# Patient Record
Sex: Male | Born: 1950 | Race: White | Marital: Married | Smoking: Never smoker
Health system: Northeastern US, Academic
[De-identification: ages and names within clinical notes are randomized; demographics above are authoritative.]

## PROBLEM LIST (undated history)

## (undated) DIAGNOSIS — G35D Multiple sclerosis, unspecified: Secondary | ICD-10-CM

## (undated) DIAGNOSIS — G35 Multiple sclerosis: Secondary | ICD-10-CM

## (undated) DIAGNOSIS — E785 Hyperlipidemia, unspecified: Secondary | ICD-10-CM

## (undated) DIAGNOSIS — F959 Tic disorder, unspecified: Secondary | ICD-10-CM

## (undated) DIAGNOSIS — J45909 Unspecified asthma, uncomplicated: Secondary | ICD-10-CM

## (undated) DIAGNOSIS — L409 Psoriasis, unspecified: Secondary | ICD-10-CM

## (undated) HISTORY — PX: JOINT REPLACEMENT: SHX530

## (undated) HISTORY — PX: INCISIONAL HERNIA REPAIR: SHX193

## (undated) HISTORY — DX: Tic disorder, unspecified: F95.9

## (undated) HISTORY — DX: Hyperlipidemia, unspecified: E78.5

## (undated) HISTORY — DX: Unspecified asthma, uncomplicated: J45.909

## (undated) HISTORY — DX: Multiple sclerosis, unspecified: G35.D

## (undated) HISTORY — PX: KNEE ARTHROSCOPY: SHX127

## (undated) HISTORY — PX: OTHER SURGICAL HISTORY: SHX169

## (undated) HISTORY — PX: HERNIA REPAIR: SHX51

## (undated) HISTORY — PX: COLONOSCOPY: SHX174

## (undated) HISTORY — DX: Psoriasis, unspecified: L40.9

## (undated) HISTORY — PX: HX TONSILLECTOMY/ADENOIDECTOMY: SHX292

## (undated) HISTORY — DX: Multiple sclerosis: G35

## (undated) HISTORY — PX: VASECTOMY: SHX75

## (undated) MED FILL — Methylprednisolone Sod Succ For Inj PF 125 MG (Base Equiv): INTRAMUSCULAR | Qty: 2 | Status: AC

## (undated) MED FILL — Acetaminophen Tab 325 MG: ORAL | Qty: 3 | Status: AC

## (undated) MED FILL — Cetirizine HCl Tab 10 MG: ORAL | Qty: 1 | Status: AC

## (undated) MED FILL — Ocrelizumab Soln For IV Infusion 300 MG/10ML: INTRAVENOUS | Qty: 20 | Status: AC

## (undated) MED FILL — Diphenhydramine HCl Tab 25 MG: ORAL | Qty: 100 | Status: AC

## (undated) MED FILL — Diphenhydramine HCl Tab 25 MG: ORAL | Qty: 1 | Status: AC

## (undated) MED FILL — Methylprednisolone Sod Succ For Inj 125 MG (Base Equiv): INTRAMUSCULAR | Qty: 2 | Status: AC

---

## 1999-11-05 ENCOUNTER — Encounter: Payer: Self-pay | Admitting: Cardiovascular Disease

## 2008-05-30 DIAGNOSIS — G35 Multiple sclerosis: Secondary | ICD-10-CM | POA: Insufficient documentation

## 2008-05-30 DIAGNOSIS — E785 Hyperlipidemia, unspecified: Secondary | ICD-10-CM

## 2008-05-30 DIAGNOSIS — I4510 Unspecified right bundle-branch block: Secondary | ICD-10-CM

## 2008-05-30 HISTORY — DX: Hyperlipidemia, unspecified: E78.5

## 2008-05-30 HISTORY — DX: Unspecified right bundle-branch block: I45.10

## 2009-02-22 DIAGNOSIS — I712 Thoracic aortic aneurysm, without rupture, unspecified: Secondary | ICD-10-CM

## 2009-02-22 HISTORY — DX: Thoracic aortic aneurysm, without rupture: I71.2

## 2009-02-22 HISTORY — DX: Thoracic aortic aneurysm, without rupture, unspecified: I71.20

## 2009-02-27 ENCOUNTER — Encounter: Payer: Self-pay | Admitting: Cardiology

## 2009-02-28 ENCOUNTER — Other Ambulatory Visit: Payer: Self-pay | Admitting: Cardiology

## 2009-02-28 DIAGNOSIS — I517 Cardiomegaly: Secondary | ICD-10-CM | POA: Insufficient documentation

## 2009-02-28 HISTORY — DX: Cardiomegaly: I51.7

## 2009-10-09 ENCOUNTER — Encounter: Payer: Self-pay | Admitting: Gastroenterology

## 2009-12-31 ENCOUNTER — Ambulatory Visit: Payer: Self-pay | Admitting: Neurology

## 2009-12-31 LAB — CBC
Hematocrit: 44 % (ref 40–51)
Hemoglobin: 15.5 g/dL (ref 13.7–17.5)
MCV: 87 fL (ref 79–92)
Platelets: 170 THOU/uL (ref 150–330)
RBC: 5.1 MIL/uL (ref 4.6–6.1)
RDW: 12.7 % (ref 11.6–14.4)
WBC: 5.9 THOU/uL (ref 4.2–9.1)

## 2009-12-31 LAB — HEPATIC FUNCTION PANEL
ALT: 55 U/L — ABNORMAL HIGH (ref 0–50)
AST: 44 U/L (ref 0–50)
Albumin: 4.3 g/dL (ref 3.5–5.2)
Alk Phos: 87 U/L (ref 40–130)
Bilirubin,Direct: <0.2 mg/dL (ref 0.0–0.3)
Bilirubin,Total: 0.7 mg/dL (ref 0.0–1.2)
Total Protein: 6.9 g/dL (ref 6.3–7.7)

## 2009-12-31 NOTE — Progress Notes (Signed)
Reason For Visit   Multiple sclerosis.  HPI   He has been doing well without any new symptoms suggesting a flareup.    He   has no difficulty with Betaseron injections.  In the past this flared up   psoriasis but this is the only a minor manageable problem latel.  Current Meds   Advair Diskus 250-50 MCG/DOSE MISC;USE 1 INHALATION EVERY 12 HOURS   DAILYprn; Rx  Proventil HFA 108 (90 Base) MCG/ACT Aerosol Solution;INHALE 2 PUFFS EVERY 6   HOURS AS NEEDED; Rx  Vectical 3 MCG/GM Ointment;; RPT  Baclofen 10 MG Tablet;TAKE 1 TABLET TWICE DAILY; Rx  Clobex Spray 0.05 % Liquid;once daily for psoriasis; RPT  Dronabinol 5 MG Capsule;TAKE 1 CAPSULE TWICE DAILY.; Rx  Famciclovir 500 MG Tablet;TAKE 1 TABLET 3 TIMES DAILY.; Rx  Hydrocodone-Acetaminophen 5-500 MG Tablet;TAKE 1 TABLET EVERY 6 HOURS AS   NEEDED.; Rx  Cialis 5 MG Tablet;TAKE 1 TABLET EVERY DAY; Rx  Betaseron 0.3 MG Solution Reconstituted;INJECT 0.25 MG SUBCUTANEOUSLY EVERY   OTHER DAY.; Rx.  ROS   Is no change in bowel control.  Is no change in bladder control.  Fatigue   is manageable.  He sensitivities manageable.  The remainder of the 10   system review is unremarkable.  .  Vital Signs   Recorded by Fabbozzi-Tomaka,Delia on 31 Dec 2009 10:10 AM  BP:120/78,  RUE,  Sitting,   HR: 56 b/min,   Weight: 183.5 lb,   Pain Scale: 0.  Physical Exam   He had normal affect and grossly normal cognition.  Cranial nerves were   intact.  Power was full throughout.  Tone and coordination were normal.    Casual gait appeared normal, but he had mild ataxia on tandem gait testing.    Romberg was mildly positive.  She walked 8 m in 3.07 seconds without use   of an aide.  .  Assessment   Overall pattern relapsing MS that has been clinically stable.  He will   continue on Betaseron.     .  Signature   Electronically signed by: Cleopatra Cedar  M.D.; 12/31/2009 12:54 PM EST.

## 2010-07-01 ENCOUNTER — Ambulatory Visit: Payer: Self-pay | Admitting: Neurology

## 2010-08-05 ENCOUNTER — Ambulatory Visit: Payer: Self-pay

## 2010-09-08 ENCOUNTER — Ambulatory Visit
Admit: 2010-09-08 | Discharge: 2010-09-08 | Disposition: A | Discharge status: Home / Self Care | Payer: Self-pay | Source: Ambulatory Visit | Attending: Primary Care | Admitting: Primary Care

## 2010-09-08 LAB — COMPREHENSIVE METABOLIC PANEL
ALT: 41 U/L (ref 0–50)
AST: 40 U/L (ref 0–50)
Albumin: 4.1 g/dL (ref 3.5–5.2)
Alk Phos: 78 U/L (ref 40–130)
Anion Gap: 10 (ref 7–16)
Bilirubin,Total: 0.7 mg/dL (ref 0.0–1.2)
CO2: 29 mmol/L — ABNORMAL HIGH (ref 20–28)
Calcium: 9.1 mg/dL (ref 8.6–10.2)
Chloride: 101 mmol/L (ref 96–108)
Creatinine: 1.15 mg/dL (ref 0.67–1.17)
GFR,Black: >59 *
GFR,Caucasian: >59 *
Glucose: 79 mg/dL (ref 74–106)
Lab: 17 mg/dL (ref 6–20)
Potassium: 4.2 mmol/L (ref 3.3–5.1)
Sodium: 140 mmol/L (ref 133–145)
Total Protein: 6.6 g/dL (ref 6.3–7.7)

## 2010-09-08 LAB — LIPID PANEL
Chol/HDL Ratio: 4.3
Cholesterol: 200 mg/dL — AB
HDL: 46 mg/dL
LDL Calculated: 137 mg/dL
Non HDL Cholesterol: 154 mg/dL
Triglycerides: 87 mg/dL

## 2010-09-08 LAB — PSA (EFF.4-2010): PSA (eff. 4-2010): 0.58 ng/mL (ref 0.00–4.00)

## 2010-09-09 ENCOUNTER — Ambulatory Visit: Payer: Self-pay | Admitting: Primary Care

## 2010-09-14 NOTE — H&P (Signed)
 Reason For Visit   Normal routine history and physical adult.  HPI   59 yo WM is new to practice, seeking new PCP.  He has longstanding but mild   and well compensated multiple sclerosis, followed by neurology specialist.    He remains able to play tennis, continues Betaseron to control inflammation   of CNS.  Fatigues more easily but not feeling limited really, doing most   that chooses to try.     Advised in past that had borderline high lipids and also some evidence of   LVH, conduction abnormality, but BP always okay, no sugar problems, no   exertional sx of concern.       Some airway reactivity has been readily controlled, intermittent, mostly   autumn with molds more than weeds, gone with first frost.     Meds reviewed.  Allergies   No Known Drug Allergy.  Current Meds   Proventil HFA 108 (90 Base) MCG/ACT Aerosol Solution;INHALE 2 PUFFS EVERY 6   HOURS AS NEEDED; Rx  Vectical 3 MCG/GM Ointment;; RPT  Clobex Spray 0.05 % Liquid;once daily for psoriasis; RPT  Betaseron 0.3 MG Solution Reconstituted;INJECT 0.25 MG SUBCUTANEOUSLY EVERY   OTHER DAY.; Rx  Cialis 5 MG Tablet;TAKE 1 TABLET EVERY DAY; Rx  Advair Diskus 250-50 MCG/DOSE Aerosol Powder Breath Activated;TAKE 1 PUFF   BY MOUTH TWICE A DAY RINSE MOUTH AFTER; RPT  Fluticasone Propionate 50 MCG/ACT Suspension;USE 1 SPRAY IN EACH NOSTRIL   EVERY DAY; RPT  Cetirizine HCl 10 MG Tablet;1 TABLET BY MOUTH DAILY; RPT.  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  PSH   Arthroscopy Knee Right; Right; 10/1999-for torn meniscus- Dr.  Elease Hashimoto  Incisional Hernia Repair; Left; 1991  Surgery Of Male Genitalia Vasectomy (V25.2); 1997  Tonsillectomy; 1960.  Family Hx   Family history of Coronary Artery Disease.  Personal Hx   Never smoked, alcohol negligible, remote marijuana experimentation.  Feels   diet is balanced and generally complete.  Happily married, enjoying his   work.  Sleep adequate, seatbelt consistent, and no environmental exposures   of concern.  Regular dental care/hygiene.  ROS   Systemic symptoms: Feeling fine  and not feeling tired (fatigue).  No   recent weight change.  Head symptoms: No headache.  Eye symptoms: No vision problems.  Otolaryngeal symptoms: No hearing loss.  No nasal symptoms  and no sore   throat.  Cardiovascular symptoms: No chest pain or discomfort, no palpitations, and   no intermittent leg claudication.  Pulmonary symptoms: No cough.  Gastrointestinal symptoms: Normal appetite, no heartburn, no nausea, no   vomiting, no abdominal pain, no change in stool, no diarrhea, and no   constipation.  Genitourinary symptoms: No hematuria  and no increase in urinary frequency.    No dysuria.  Endocrine symptoms: No polydipsia.  Hematologic symptoms: No easy bleeding  and no tendency for easy bruising.  Musculoskeletal symptoms: No back pain  and no myalgias.  No soft tissue   swelling, no localized joint pain, and no localized joint swelling.  Neurological symptoms: No  dizziness, no lightheadedness, no fainting, and   no memory lapses or loss.  Psychological symptoms: No depression.  Skin symptoms: No rash.  No localized skin lesions  that seem active,   changing.  Past medical history: A recent examination by an ophthalmologist.  Immunizations   DT; 06 Feb 1997  Influenza; 11 Aug 2007  Influenza; 19 Jun 2008  H1N1 Influenza Inj; 25 Sep 2008  Influenza; 22 Jul 2009  Influenza (Split); 05 Aug 2010.  Health Mgmt Plan   Colonoscopy every 10 years; for HEALTH MAINTENANCE.  POCT   ECG:  sinus bradycardia with prominent LV  voltage and inferior wall ST-T   changes, normal anterior forces.  No ectopy, nl conduction.  Looks like   2006 tracing in old records.  Vital Signs   Recorded by [email or web address] on 09 Sep 2010 09:10 AM  BP:109/78,  LUE,  Sitting,   HR: 56 b/min,  Apical, Regular,   Resp: 16 r/min, Normal,   Height: 70.5 in, Weight: 183 lb, BMI: 25.9 kg/m2.  Physical Exam   GENERAL: 58 year y/o male in NAD.  HEENT: Neck supple, PERRL, EOMI, Fundi benign, TM's clear bilaterally,   oropharynx clear, thyroid without obvious nodules or enlargement. No   lymphadenopathy.   LUNGS: CTA bilaterally, no wheezes with forced expiration, respirations   unlabored.  CV: Regular rate without murmurs, rubs, or gallops, No carotid bruits heard.  ABDOMEN: + BS. Soft, non-tender, no HSM, no palpable masses, no scars.  GENITALIA: Normal male genitalia. No penile discharge. No palpable   testicular masses. No palpable hernias.   RECTAL: Normal tone, no palpable masses, stool Hemoccult negative. Prostate   not enlarged, no nodules.  EXTREMITIES: Distal pulses 2+ bilaterally, no edema.  SKIN: No rashes, no evidence of skin breakdown, no suspicious nevi.  NEURO: Cranial nerves II-XII intact. Gait normal. Reflexes 2+ and symmetric.  MENTAL STATUS: Alert, normal MS. Answers all questions appropriately.  LYMPHATIC: No enlarged nodes.  Assessment   M.S. -- well compensated, not functionally intrusive, and f/u in place.    Will avoid live-virus vaccines.     CV -- mild aortic root and LA enlargement, LVH noted.  Excellent BPs, no   signif valvular insuff/stenoses, so will simply follow.  Urged to get more   consistent CV exercise, and also to get LDL down a bit although TG, HDL   okay and no CV prematurity in family, otherwise low CAD risk profile.     Asthma/allergy -- discussed LABA controversy, and he'll discuss with his   specialist.  Is content with current control of allergy season-related sx.     Maint -- Adacel, Pneumovax today.  Proxy to be forwarded.  Coun/Edu    --Lab results discussed; see scanned image  --Diet/body weight discussed  --Aerobic exercise discussed  --Alcohol use discussed  --Recreational drug use discussed  --Colon CA screening discussed.  --Testicular self exam discussed  --Prostate cancer screening discussed   --Skin CA awareness/prevention discussed  --Dental care discussed  --Cardiac risk factor modification discussed  --Motor vehicle safety discussed.  Signature   Electronically signed by: Rosezena Sensor  M.D.; 09/14/2010 9:01 PM EST; Chartered loss adjuster.

## 2010-10-21 NOTE — Miscellaneous (Unsigned)
 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.4)   Problem: MRI Knee Osteonecrosis Medial Tibial Plateau; Left  Diagnosis: Multiple Sclerosis (340)     Family History  Family history of Coronary Artery Disease    Social History  Alcohol  Brushing The Teeth  Caffeine Use  Exercise Habits  Flossing The Teeth  Habits Sleep  Housing Without Fire Extinguishers  Marital History  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 ; Rx   Cetirizine HCl 10 MG Tablet; 1 TABLET BY MOUTH DAILY ; RPT   Cialis 5 MG Tablet; TAKE 1 TABLET EVERY DAY ; Rx   Clobex Spray 0.05 % Liquid; once daily for psoriasis ; RPT   Fluticasone Propionate 50 MCG/ACT Suspension; USE 1 SPRAY IN EACH NOSTRIL   EVERY DAY ; RPT   Proventil HFA 108 (90 Base) MCG/ACT Aerosol Solution; INHALE 2 PUFFS EVERY   6 HOURS AS NEEDED ; Rx   Vectical 3 MCG/GM Ointment ; RPT     Immunizations  DT   Influenza   Influenza   H1N1 Influenza Inj   Influenza   Influenza (Split)   Tdap (Adacel)   Pneumo (Pneumovax)

## 2011-04-22 ENCOUNTER — Telehealth: Payer: Self-pay

## 2011-04-22 NOTE — Telephone Encounter (Signed)
 Called, no answer, left VM.  [name]

## 2011-04-22 NOTE — Telephone Encounter (Signed)
Patient having burning in foot.  Offered appt with dr. Ardelia Mems on 7/30 but he feels that is too far away.  Please call to discuss issue.

## 2011-04-23 ENCOUNTER — Other Ambulatory Visit: Payer: Self-pay | Admitting: Medical

## 2011-04-23 ENCOUNTER — Other Ambulatory Visit: Payer: Self-pay | Admitting: Family Medicine

## 2011-04-23 MED ORDER — GABAPENTIN 300 MG PO CAPS
300.0000 mg | ORAL_CAPSULE | Freq: Four times a day (QID) | ORAL | Status: DC | PRN
Start: 2011-04-23 — End: 2011-10-26

## 2011-04-23 NOTE — Telephone Encounter (Signed)
Spoke to pt, some recurrent, but not constant left foot pain.  Had similar symptoms in past.  Retry gabapentin.  Rx sent to pharm.  If progressive symptoms, will call back.  [name]

## 2011-04-28 ENCOUNTER — Encounter: Payer: Self-pay | Admitting: Gastroenterology

## 2011-07-29 ENCOUNTER — Other Ambulatory Visit: Payer: Self-pay | Admitting: Primary Care

## 2011-07-29 MED ORDER — TADALAFIL 5 MG PO TABS *I*
ORAL_TABLET | ORAL | Status: DC
Start: 2011-07-29 — End: 2012-08-31

## 2011-10-26 ENCOUNTER — Ambulatory Visit: Payer: Self-pay | Admitting: Sports Medicine

## 2011-10-26 ENCOUNTER — Encounter: Payer: Self-pay | Admitting: Sports Medicine

## 2011-10-26 VITALS — BP 117/81 | Ht 71.0 in | Wt 182.0 lb

## 2011-10-26 DIAGNOSIS — M25569 Pain in unspecified knee: Secondary | ICD-10-CM

## 2011-11-02 ENCOUNTER — Ambulatory Visit: Payer: Self-pay | Admitting: Sports Medicine

## 2011-11-02 ENCOUNTER — Encounter: Payer: Self-pay | Admitting: Sports Medicine

## 2011-11-02 VITALS — BP 108/76 | Ht 71.0 in | Wt 182.0 lb

## 2011-11-02 DIAGNOSIS — M25569 Pain in unspecified knee: Secondary | ICD-10-CM

## 2011-11-09 NOTE — H&P (Signed)
PATIENTHALIM, CYPRET  MR #:  [id number]   ACCOUNT #:  [id number] DOB:  01/12/1951   ATTENDING:  Lanae Boast, MD AGE:  61   DATE OF VISIT:  10/26/2011     New patient consultation:  A 61 year old male with complaints of right medial knee pain.  The patient has had symptoms since November, medial based, aggravated playing tennis.  Pain is 5/10.  No treatment or diagnostics to date.  Here today for formal consultation and evaluation of options.    PAST MEDICAL HISTORY:  Denies heart disease, stroke, diabetes, hypertension, and blood clots.    REVIEW OF SYSTEMS:  Ten-system review notable for asthma and some breathing issues.  Occasional numbness and tingling.    PHYSICAL EXAMINATION:  Pleasant, cooperative, alert and oriented, in no apparent stress.  Focused examination of the right knee:  There is no warmth, erythema, ecchymosis, or skin lesion.  Tender to palpation across the medial compartment.  Limitations in flexion, circumduction.  Stable ligamentously.  No hip or low back findings.    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  Reviewed the potential benefit of an interarticular corticosteroid injection.  He can not have one at this point in time, but will contact us and return in the next week or 10 days for a injection of corticosteroid into the medial compartment if not significantly better.  Further diagnostics could be performed.  He has been seen for this right knee back in 2006.             ______________________________  Lanae Boast, MD    MM/MODL  DD:  11/09/2011 07:00:39  DT:  11/09/2011 07:15:45  Job #:  [id number]

## 2011-11-11 ENCOUNTER — Other Ambulatory Visit: Payer: Self-pay | Admitting: Nurse Practitioner

## 2011-11-18 ENCOUNTER — Ambulatory Visit: Payer: Self-pay | Admitting: Primary Care

## 2011-11-18 ENCOUNTER — Encounter: Payer: Self-pay | Admitting: Primary Care

## 2011-11-18 VITALS — BP 122/70 | HR 60 | Ht 70.87 in | Wt 180.0 lb

## 2011-11-18 DIAGNOSIS — M23309 Other meniscus derangements, unspecified meniscus, unspecified knee: Secondary | ICD-10-CM

## 2011-11-18 MED ORDER — SULFAMETHOXAZOLE-TRIMETHOPRIM 800-160 MG PO TABS *I*
1.0000 | ORAL_TABLET | Freq: Two times a day (BID) | ORAL | Status: AC
Start: 2011-11-18 — End: 2011-11-27

## 2011-11-18 NOTE — Progress Notes (Signed)
Subjective:     Patient ID: [name] is a 61 y.o. male.    HPI  He returns to discuss a few things:    Skin lesion -- he injects Betaseron daily into subcut abdomen as before, and has developed an area that is red, increasingly tender at injection site R lower abd wall.  No drainage, applying Neosporin, not helping.  He notes no fever/chills.  A week or so?    CV -- cardiol following incidental finding of aortic widening in ascending aorta, stable at last check and on schedule.  He notes no palpitations, lightheadedness, DOE and enjoys vigorous physical exercise, tennis.    MSK -- some knee DJD, recent knee steroid injection, "something else they can inject if this does not work."  No instability, no signif swelling.    Patient's medications, allergies, past medical, surgical, social and family histories were reviewed and updated as appropriate.  Spouse recovering from elbow procedure, and now her knees also bothering, avid tennis player.  [name] remains nonsmoker.    Review of Systems          Objective:   Physical Exam  Multiple areas of induration and fatty necrosis with irregular subcut tissue texture, and focal quarter-sized area of erythema, tenderness without fluctuance or expressible exudate, R mid abd.  Normal underlying bowel sounds, soft, no HSM, no regional adenopathy.        Assessment:      The area of induration and tenderness, erythema is in area where multiple scars and lipodystrophy have resulted from serial injections.  I detect no drainable collection, but cannot r/o in this abnormal subcut environment.  Will treat initially as cellulitis with Septra and warm compresses, but may need [place] or other imaging if inflammation persists.  To stop Neosporin in case complicating.    Discussed the aortic widening noted at echocardiogram in 2011, need to update later this year.  Exercise tolerance unchanged as above.    M.S. Remains well compensated, playing tennis, etc.  Hopefully the knee injection will  help with DJD, and has orthopedic f/u, options.    Immuniz current, not candidate for live attenuated Zostavax on immunosuppression.  Colorectal screening current.        Plan:      As above.

## 2011-11-18 NOTE — Patient Instructions (Addendum)
Please stop the topical Neosporin and consider using Polysporin in the future for such lesions/rashes.  Start the twice daily sulfa antibiotic today, and also try to apply warm/moist compresses once or twice daily as able, 20 minutes at a time.  Consider signing up for MyChart as below, to facilitate communication over the next 7 to 10 days.  Will need to consider incision and drainage if the area remains painful and tender into next week.  Cardiologist has recommended repeat ultrasound in [id number], to follow aortic size.

## 2011-11-24 ENCOUNTER — Encounter: Payer: Self-pay | Admitting: Primary Care

## 2011-12-01 ENCOUNTER — Telehealth: Payer: Self-pay | Admitting: Primary Care

## 2011-12-01 NOTE — Telephone Encounter (Signed)
PT WILL BE SEEN AT HIS REQUEST TOMORROW [facility]....Lorain Childes

## 2011-12-01 NOTE — Telephone Encounter (Signed)
[name], so his e-mail of last week was premature.  He is on interferon, and if not responding to Septra course, needs prompt surgical input regarding possible need for I/D.

## 2011-12-01 NOTE — Telephone Encounter (Signed)
Pt states the lesion on the side of his abd is getting worse.  He states it is red, warm to touch and not looking or feeling any better then it did when he saw you a couple of weeks ago.  He would like to know next steps?  Please advise

## 2011-12-01 NOTE — Telephone Encounter (Signed)
Noted. Thanks.

## 2011-12-01 NOTE — Telephone Encounter (Signed)
SWP - HE CANNOT GO AND HAVE THIS LESION LOOKED AT UNTIL TOMORROW AFTER 3, THURS, OR Friday.  I AM WAITING TO HEAR BACK FROM SURGERY CLINIC ON A TIME

## 2011-12-02 ENCOUNTER — Encounter: Payer: Self-pay | Admitting: Medical

## 2011-12-02 ENCOUNTER — Ambulatory Visit: Payer: Self-pay | Admitting: Medical

## 2011-12-02 VITALS — BP 140/91 | HR 66 | Temp 97.2°F | Resp 18 | Ht 71.5 in | Wt 183.0 lb

## 2011-12-02 NOTE — Progress Notes (Signed)
Urgent Surgical Consult Note    CC: right abdominal wall lump  HPI:  [name] is a 61 y.o. male presenting with a right abdominal lump first noticed 4 weeks ago. It became more red and painful and increased in size. He was given antibiotics for 2 weeks by his pcp. This helped and the redness improved. He noticed for the past few days that is is still red and painful. It is not warm and there is no drainage. He does not think it is more red, just ongoing redness. He has no fever. Of note he injects medication into his abdomen every other day for his ms..     ROS:   Gen: no fever, chills, weight loss  Skin: as per HPI  SH: Currently a non-smoker  Allergies:Environmental and Environmental allergies  Medications:  Current Outpatient Prescriptions   Medication    calcitriol (VECTICAL) 3 MCG/GM ointment    tadalafil (CIALIS) 5 MG tablet    Clobetasol Propionate (CLOBEX SPRAY) 0.05 % external spray    interferon beta-1b (BETASERON) 0.3 MG injection    fluticasone-salmeterol (ADVAIR DISKUS) 250-50 MCG/DOSE diskus inhaler    fluticasone (FLONASE) 50 MCG/ACT nasal spray    cetirizine (ZYRTEC) 10 MG tablet    albuterol (PROVENTIL HFA) 108 (90 BASE) MCG/ACT inhaler       PE:    General: AAOx3, NAD, appears stated age  Skin:right abdominal wall with a 3 x 2 cm area which is noted to be red, surrounding tenderness and firm  Pertinent negatives include no, wwarmth, redness or streaking.    A/P:    This is a 61 y.o. male with a abdominal wall abscess     Procedure Note     Pre-operative Diagnosis: abdominal wall abscess     Post-operative Diagnosis: same    The procedure, risks and complications have been discussed in detail (including, but not limited to, infection, bleeding, poor wound healing) with the patient, and the patient has signed consent to the procedure.     Procedure Details:  Area was cleansed with betadine and infiltrated with 3 cc 1% lidocaine with epinephrine After adequate local anesthesia, a linear  incision with a #11 blade was performed on the abdomen. Purulent drainage was absent.  Hemostasis was achieved. The wound was packed with iodoform gauze and bandaged.    Patient was educated to call with signs of cellulitis or infection.     The patient will follow up on 12/04/2011  Antibiotics at discharge: none needed    Discharge Condition:   Stable     Complications:   none.

## 2011-12-02 NOTE — Patient Instructions (Signed)
FOLLOW UP CARE  You have been scheduled for follow up with Milas Gain PA on 12/04/2011 at 10:00 for a wound check      Abscess/Boil, Care After Surgery  [facility])     An abscess (also called a boil or furuncle) is an infected area that contains a collection of pus. Signs and symptoms of an abscess include pain, tenderness, redness, or hardness, or you may feel a moveable soft area under your skin. An abscess can occur anywhere in the body. The infection may spread to surrounding tissues causing cellulitis. You likely had the abscess a month ago and the antibiotics treated it. The residual inflammation is what you were noticing. Due to the redness A cut (incision)  was made over your apparent abscess was opened. There was no obvious pus. Gauze was packed into the space to provide a drain that will allow the cavity to heal from the inside outwards.     HOME CARE INSTRUCTIONS   Only take over-the-counter or prescription medicines for pain, discomfort, or fever as directed by your caregiver.    Keep the incision dry and covered until tomorrow  Tomorrow, you may shower. Prior to showering,, remove gauze or iodoform packs..You may then wash the wound gently with mild soapy water. Pat wound dry.cover with bandage    Call Milas Gain PA [phone] IF:   You develop increased pain, swelling, redness, drainage, or bleeding in the wound site.   You develop signs of generalized infection including muscle aches, chills, fever, or a general ill feeling.   An oral temperature above 101 develops, not controlled by medication.     Do not inject into this area until it is fully healed.

## 2011-12-04 ENCOUNTER — Ambulatory Visit: Payer: Self-pay | Admitting: Medical

## 2011-12-04 ENCOUNTER — Encounter: Payer: Self-pay | Admitting: Medical

## 2011-12-04 VITALS — BP 140/93 | HR 67 | Temp 98.1°F | Resp 18 | Ht 71.0 in | Wt 180.0 lb

## 2011-12-04 DIAGNOSIS — Z5189 Encounter for other specified aftercare: Secondary | ICD-10-CM

## 2011-12-04 NOTE — Patient Instructions (Signed)
No need to pack. Cover prn

## 2011-12-04 NOTE — Progress Notes (Signed)
S/p I&D of abdominal wall abscess    S: feeling better. No drainage, redness, warmth or streaking. No fever    O: right abdominal wall with a 1 cm incision with no erythema, edema, warmth, drainage, fluctuance    A/P: healing incision with no evidence of infection or cellulitis  May cover PRN  RTC PRN

## 2011-12-21 ENCOUNTER — Other Ambulatory Visit: Payer: Self-pay | Admitting: Nurse Practitioner

## 2011-12-23 ENCOUNTER — Other Ambulatory Visit: Payer: Self-pay | Admitting: Medical

## 2011-12-23 ENCOUNTER — Other Ambulatory Visit: Payer: Self-pay | Admitting: Nurse Practitioner

## 2011-12-23 MED ORDER — INTERFERON BETA-1B 0.3 MG SC SOLR *A*
SUBCUTANEOUS | Status: DC
Start: 2011-12-23 — End: 2011-12-23

## 2011-12-23 MED ORDER — INTERFERON BETA-1B 0.3 MG SC SOLR *A*
SUBCUTANEOUS | Status: DC
Start: 2011-12-23 — End: 2012-10-15

## 2012-01-13 ENCOUNTER — Ambulatory Visit: Payer: Self-pay | Admitting: Neurology

## 2012-01-13 ENCOUNTER — Encounter: Payer: Self-pay | Admitting: Neurology

## 2012-01-13 VITALS — BP 128/78 | HR 61 | Ht 71.5 in | Wt 183.0 lb

## 2012-01-13 LAB — COMPREHENSIVE METABOLIC PANEL
ALT: 37 U/L (ref 0–50)
AST: 33 U/L (ref 0–50)
Albumin: 4.2 g/dL (ref 3.5–5.2)
Alk Phos: 75 U/L (ref 40–130)
Anion Gap: 10 (ref 7–16)
Bilirubin,Total: 0.4 mg/dL (ref 0.0–1.2)
CO2: 29 mmol/L — ABNORMAL HIGH (ref 20–28)
Calcium: 8.4 mg/dL — ABNORMAL LOW (ref 8.6–10.2)
Chloride: 102 mmol/L (ref 96–108)
Creatinine: 0.96 mg/dL (ref 0.67–1.17)
GFR,Black: 98 *
GFR,Caucasian: 84 *
Glucose: 67 mg/dL (ref 60–99)
Lab: 17 mg/dL (ref 6–20)
Potassium: 3.5 mmol/L (ref 3.3–5.1)
Sodium: 141 mmol/L (ref 133–145)
Total Protein: 6.6 g/dL (ref 6.3–7.7)

## 2012-01-13 LAB — CBC AND DIFFERENTIAL
Baso # K/uL: 0 10*3/uL (ref 0.0–0.1)
Basophil %: 0.2 % (ref 0.2–1.2)
Eos # K/uL: 0.2 10*3/uL (ref 0.0–0.5)
Eosinophil %: 2.7 % (ref 0.8–7.0)
Hematocrit: 43 % (ref 40–51)
Hemoglobin: 14.2 g/dL (ref 13.7–17.5)
Lymph # K/uL: 2.5 10*3/uL (ref 1.3–3.6)
Lymphocyte %: 41.2 % (ref 21.8–53.1)
MCV: 88 fL (ref 79–92)
Mono # K/uL: 0.8 10*3/uL (ref 0.3–0.8)
Monocyte %: 13.4 % — ABNORMAL HIGH (ref 5.3–12.2)
Neut # K/uL: 2.5 10*3/uL (ref 1.8–5.4)
Platelets: 192 10*3/uL (ref 150–330)
RBC: 4.9 MIL/uL (ref 4.6–6.1)
RDW: 12.6 % (ref 11.6–14.4)
Seg Neut %: 42.5 % (ref 34.0–67.9)
WBC: 6 10*3/uL (ref 4.2–9.1)

## 2012-01-13 NOTE — Patient Instructions (Addendum)
We will send in the enrollment form for [employer]. Please have your blood drawn today.  We will also check an MRI of your brain as a new baseline and then repeat it in about 1 year. Please call 1-2 days after the MRI scan to discuss the results.  Remember that many people in the studies of Tecfidera experienced nausea, diarrhea and flushing when they first started it.

## 2012-01-19 ENCOUNTER — Encounter: Payer: Self-pay | Admitting: Gastroenterology

## 2012-01-20 ENCOUNTER — Telehealth: Payer: Self-pay | Admitting: Neurology

## 2012-01-20 NOTE — Telephone Encounter (Signed)
Wanted to know the status of his BG-12 enrollment, his labs are in but I do not have a form for him up here?/ Do you have this on you if so can you put it in scan bin to come up also please let me know if his labs are OK to start BG-12 and if his form is down there or if i need to mail him another one  Thanks

## 2012-01-20 NOTE — Telephone Encounter (Signed)
Just received his form disregard previous message thanks

## 2012-01-22 ENCOUNTER — Encounter: Payer: Self-pay | Admitting: Neurology

## 2012-01-22 NOTE — Progress Notes (Addendum)
[facility]    Subjective:  [name] is a 61 y.o. M here for a regular follow-up of relapsing remitting MS. He is hoping to change from the Betaseron that he has taken for 18 years to an oral disease modifying therapy. After years of injections, he is running of of locations to do injections. He also had an abdominal wall abscess 2/13, that may have been caused by an injection.  He has not had any new symptoms. He remains very function and plays tennis frequently.  His ongoing symptoms include cramps in his feet at night, that can be helped by baclofen, but he does not need to take this often.ED is helped by Cialis. Fatigue occurs in the afternoon, but he typically naps and then feels better. He has not had burning in his feet recently. He denies weakness, cognitive issues, mood problems, bladder or bowel symptoms.  He unfortunately lost his elderly mother 2/13.      MS History:  Initial clinical presentation:1990's R arm weakness  Later clinical presentations:numbness hands and feet  Initial MRI:  Follow-up MRI:4/08 brain-relatively mild lesion burden, 1 enhancing lesion at the time  Other testing:no recent CBC or LFTs  Disease modifying agent treatment HQ:IONGEXBMW started in 1990's  Symptomatic treatment hx:ED-Cialis helps             Foot cramps-baclofen can be helpful    Past Medical History   Diagnosis Date   . Multiple sclerosis    . HLD (hyperlipidemia)    . Psoriasis      Family History   Problem Relation Age of Onset   . Conversion Other      [id number]^Coronary Artery UXLKGMW^102.00^Active^   . Mult sclerosis Neg Hx    . Lupus Neg Hx    . Rheum arthritis Neg Hx    . Thyroid disease Neg Hx      History     Social History   . Marital Status: Married     Social History Main Topics   . 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         Meds:  Current Outpatient Prescriptions on File Prior to Visit    Medication Sig Dispense Refill   . interferon beta-1b (BETASERON) 0.3 MG injection INJECT 0.25 MG SUBCUTANEOUSLY EVERY OTHER DAY.  15 vial  0   . calcitriol (VECTICAL) 3 MCG/GM ointment Apply topically 3 times daily as needed       . fluticasone-salmeterol (ADVAIR DISKUS) 250-50 MCG/DOSE diskus inhaler Inhale 1 puff into the lungs 2 times daily as needed       . fluticasone (FLONASE) 50 MCG/ACT nasal spray 2 sprays by Nasal route daily as needed       . cetirizine (ZYRTEC) 10 MG tablet Take 10 mg by mouth daily as needed       . tadalafil (CIALIS) 5 MG tablet TAKE 1 TABLET EVERY DAY  30 tablet  5   . Clobetasol Propionate (CLOBEX SPRAY) 0.05 % external spray once daily for psoriasis    0   . albuterol (PROVENTIL HFA) 108 (90 BASE) MCG/ACT inhaler INHALE 2 PUFFS EVERY 6 HOURS AS NEEDED  7  0        Al:  Allergies   Allergen Reactions   . Environmental (Mold)    . Environmental Allergies         ROS:  10 point ROS per HPI    Vitals  Blood pressure 128/78, pulse 61, height 1.816 m (5' 11.5"), weight 83.008 kg (183 lb).    PE:  Gen: Well appearing, NAD.  MS: Alert and oriented. Fluent. Affect appropriate for situation.  CN: Pupils 4 to 3mm without APD.  Full EOM, few beats of nystagmus on L gaze. Full facial strength and sensation. Hearing full to soft voice. Palate elevation symmetric. Tongue midline.  Motor: Normal bulk and tone. 5/5 strength UE and LE.  DTRs: 2+ biceps, triceps, BR, 3+patellars and 2+ankles.   Sens: Moderately decreased vibration in L toes and absent in R toes.  Coord: No ataxia on FTN.  Gait: Normal casual gait. 25 ft walk 3.37 sec without assistive device.    Assesment and Plan:  61 y.o. M with relatively mild relapsing remitting MS that has been stable on Betaseron for nearly 2 decades. He is developing injection site problems and so changing to an oral DMT with no known serious risk such as Tecfidera is reasonable. We will first update his MRI brain (pt reluctant due to cost and discomfort, but  was convinced). Will check CBC and CMP before starting this med. Enrollment paperwork filled out today.  Will need to repeat an MRI brain looking at the efficacy of the Tecifera one year after it is started.      This patient has been discussed with and examined by the attending, Dr Cleopatra Cedar.    JESSICA ROBB, MD    I saw and evaluated the patient. I agree with the resident's/fellow's findings and plan of care as documented above.    Angelena Form, MD

## 2012-02-12 ENCOUNTER — Ambulatory Visit
Admit: 2012-02-12 | Discharge: 2012-02-12 | Disposition: A | Discharge status: Home / Self Care | Payer: Self-pay | Source: Ambulatory Visit | Attending: Neurology | Admitting: Neurology

## 2012-02-25 ENCOUNTER — Telehealth: Payer: Self-pay | Admitting: Neurology

## 2012-02-25 NOTE — Telephone Encounter (Signed)
Spoke to patient and he is having more flushing in face along with hot flashes. He also feels the medication is giving him more stomach issues and cramps. Suggested he decrease to one tablet per day over the weekend and call back with any new symptoms on Monday.

## 2012-02-25 NOTE — Telephone Encounter (Signed)
Just started tecfidera having various side effects please call to discuss

## 2012-02-29 ENCOUNTER — Telehealth: Payer: Self-pay | Admitting: Nurse Practitioner

## 2012-02-29 NOTE — Telephone Encounter (Signed)
Please call patient regarding tecfidera

## 2012-02-29 NOTE — Telephone Encounter (Signed)
Spoke to patient and he states is feeling slightly better on one pill per day. Less hot flashes and flushed face. Encouraged him to stay on one pill for another week and call next Monday with any of his symptoms. He will call back

## 2012-03-09 ENCOUNTER — Telehealth: Payer: Self-pay | Admitting: Medical

## 2012-03-09 NOTE — Telephone Encounter (Signed)
Left message with after hrs stating he has questions regarding his medication dosage

## 2012-03-09 NOTE — Telephone Encounter (Signed)
Spoke to pt.  Tolerating Tecfidera 240 mg QD better, less hot flashes.  Will try to increase to BID.  [name]

## 2012-03-21 ENCOUNTER — Encounter: Payer: Self-pay | Admitting: Gastroenterology

## 2012-05-10 ENCOUNTER — Encounter: Payer: Self-pay | Admitting: Gastroenterology

## 2012-06-02 ENCOUNTER — Other Ambulatory Visit: Payer: Self-pay | Admitting: Medical

## 2012-06-02 ENCOUNTER — Telehealth: Payer: Self-pay | Admitting: Medical

## 2012-06-02 MED ORDER — BACLOFEN 10 MG PO TABS *I*
10.0000 mg | ORAL_TABLET | Freq: Three times a day (TID) | ORAL | Status: DC | PRN
Start: 2012-06-02 — End: 2012-11-24

## 2012-06-02 NOTE — Telephone Encounter (Signed)
Spoke to  [employer]  Over the last 2 weeks he's been getting recurrent bilateral lower extremity muscle cramping which occurs only at night, but which is bothersome and requires him to get up and stretch out his lower legs.  This is similar to muscle spasms he's had in the past, but a bit more prominent.  In the past he has used baclofen, but he hasn't needed anything in some time.  We agreed to resume baclofen taking 10 mg about an hour before bed.  If needed he can slowly increase this dose before bed as tolerated up to 20 or even 30 mg if needed.  If the symptoms persist or progress, I asked him to call back.  [name]

## 2012-06-02 NOTE — Telephone Encounter (Signed)
Cramps in legs at night Please call to discuss

## 2012-07-15 ENCOUNTER — Ambulatory Visit: Payer: Self-pay

## 2012-07-15 DIAGNOSIS — Z23 Encounter for immunization: Secondary | ICD-10-CM

## 2012-08-31 ENCOUNTER — Other Ambulatory Visit: Payer: Self-pay | Admitting: Primary Care

## 2012-10-13 ENCOUNTER — Telehealth: Payer: Self-pay | Admitting: Primary Care

## 2012-10-13 DIAGNOSIS — R252 Cramp and spasm: Secondary | ICD-10-CM

## 2012-10-13 DIAGNOSIS — Z13220 Encounter for screening for lipoid disorders: Secondary | ICD-10-CM

## 2012-10-13 DIAGNOSIS — Z125 Encounter for screening for malignant neoplasm of prostate: Secondary | ICD-10-CM

## 2012-10-13 NOTE — Telephone Encounter (Signed)
LABS BEFORE PHY PLEASE. THANKS

## 2012-10-15 NOTE — Telephone Encounter (Signed)
Fasting please.  Thanks.

## 2012-11-17 ENCOUNTER — Ambulatory Visit
Admit: 2012-11-17 | Discharge: 2012-11-17 | Disposition: A | Discharge status: Home / Self Care | Payer: Self-pay | Source: Ambulatory Visit | Attending: Primary Care | Admitting: Primary Care

## 2012-11-17 DIAGNOSIS — Z13220 Encounter for screening for lipoid disorders: Secondary | ICD-10-CM

## 2012-11-17 DIAGNOSIS — Z125 Encounter for screening for malignant neoplasm of prostate: Secondary | ICD-10-CM

## 2012-11-17 DIAGNOSIS — R252 Cramp and spasm: Secondary | ICD-10-CM

## 2012-11-17 DIAGNOSIS — IMO0002 Reserved for concepts with insufficient information to code with codable children: Secondary | ICD-10-CM

## 2012-11-17 LAB — COMPREHENSIVE METABOLIC PANEL
ALT: 33 U/L (ref 0–50)
AST: 37 U/L (ref 0–50)
Albumin: 4.5 g/dL (ref 3.5–5.2)
Alk Phos: 75 U/L (ref 40–130)
Anion Gap: 11 (ref 7–16)
Bilirubin,Total: 0.7 mg/dL (ref 0.0–1.2)
CO2: 28 mmol/L (ref 20–28)
Calcium: 9 mg/dL (ref 8.6–10.2)
Chloride: 102 mmol/L (ref 96–108)
Creatinine: 1 mg/dL (ref 0.67–1.17)
GFR,Black: 92 *
GFR,Caucasian: 80 *
Glucose: 85 mg/dL (ref 60–99)
Lab: 13 mg/dL (ref 6–20)
Potassium: 4.4 mmol/L (ref 3.3–5.1)
Sodium: 141 mmol/L (ref 133–145)
Total Protein: 6.6 g/dL (ref 6.3–7.7)

## 2012-11-17 LAB — LIPID PANEL
Chol/HDL Ratio: 4
Cholesterol: 224 mg/dL — AB
HDL: 56 mg/dL
LDL Calculated: 152 mg/dL
Non HDL Cholesterol: 168 mg/dL
Triglycerides: 78 mg/dL

## 2012-11-17 LAB — PSA (EFF.4-2010): PSA (eff. 4-2010): 0.58 ng/mL (ref 0.00–4.00)

## 2012-11-17 LAB — MAGNESIUM: Magnesium: 1.8 mEq/L (ref 1.3–2.1)

## 2012-11-17 LAB — TSH: TSH: 2.48 u[IU]/mL (ref 0.27–4.20)

## 2012-11-24 ENCOUNTER — Encounter: Payer: Self-pay | Admitting: Primary Care

## 2012-11-24 ENCOUNTER — Ambulatory Visit: Payer: Self-pay | Admitting: Primary Care

## 2012-11-24 VITALS — BP 110/74 | HR 71 | Resp 16 | Ht 71.0 in | Wt 175.0 lb

## 2012-11-24 DIAGNOSIS — I517 Cardiomegaly: Secondary | ICD-10-CM

## 2012-11-24 DIAGNOSIS — J45909 Unspecified asthma, uncomplicated: Secondary | ICD-10-CM

## 2012-11-24 DIAGNOSIS — E785 Hyperlipidemia, unspecified: Secondary | ICD-10-CM

## 2012-11-24 DIAGNOSIS — I712 Thoracic aortic aneurysm, without rupture: Secondary | ICD-10-CM

## 2012-11-24 LAB — HM HIV SCREENING OFFERED

## 2012-11-24 MED ORDER — ATORVASTATIN CALCIUM 10 MG PO TABS *I*
10.0000 mg | ORAL_TABLET | Freq: Every day | ORAL | Status: DC
Start: 2012-11-24 — End: 2013-05-20

## 2012-11-24 NOTE — Patient Instructions (Addendum)
We agreed that will proceed with trial of atorvastatin.  You will watch for any untoward skeletal muscle side effects.  If tolerating well, would recommend you consider fasting blood test update after the first month.  We can consider going to 20 mg daily depending upon course.  Continue efforts with diet, exercise as discussed.

## 2012-11-28 DIAGNOSIS — J45909 Unspecified asthma, uncomplicated: Secondary | ICD-10-CM

## 2012-11-28 HISTORY — DX: Unspecified asthma, uncomplicated: J45.909

## 2012-11-28 NOTE — H&P (Signed)
History and Physical    HISTORY:  Chief Complaint   Patient presents with   . Annual Exam         History of Present Illness:    HPI  Now 62 yo, here for HMP today.  He had cardiac anatomy reassessed in mid 2013, and LVH, aortic valve insuff and mild aortic root dilation all felt to be stable with no need for f/u for another two years.    On oral treatment for his multiple sclerosis and finding it well tolerated, efficacious.      Seasonal treatment of airway reactivity through Drenda Freeze also working well.    Problems:  Patient Active Problem List   Diagnosis Code   . Hyperlipidemia 272.4   . 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 Multiple Sclerosis 340   . 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 Multiple sclerosis    . HLD (hyperlipidemia)    . Psoriasis      Past Surgical History   Procedure Laterality Date   . Knee arthroscopy Right    . Incisional hernia repair     . Hx tonsillectomy/adenoidectomy     . Vasectomy           Allergies:    Allergies   Allergen Reactions   . Environmental (Mold)    . Environmental Allergies        Current medications:    Current Outpatient Prescriptions   Medication Sig   . Dimethyl Fumarate (TECFIDERA PO)    . CIALIS 5 MG tablet TAKE 1 TABLET EVERY DAY   . cholecalciferol (VITAMIN D) 1000 UNIT tablet Take 1,000 Units by mouth daily   . aqscorbic acid (VITAMIN C) 1000 MG tablet Take 1,000 mg by mouth daily   . Cyanocobalamin (VITAMIN B-12 CR PO) Take by mouth   . fluticasone-salmeterol (ADVAIR DISKUS) 250-50 MCG/DOSE diskus inhaler Inhale 1 puff into the lungs 2 times daily as needed   . fluticasone (FLONASE) 50 MCG/ACT nasal spray 2 sprays by Nasal route daily as needed   . cetirizine (ZYRTEC) 10 MG tablet Take 10 mg by mouth daily as needed   . albuterol (PROVENTIL HFA) 108 (90 BASE) MCG/ACT inhaler  INHALE 2 PUFFS EVERY 6 HOURS AS NEEDED   . atorvastatin (LIPITOR) 10 MG tablet Take 1 tablet (10 mg total) by mouth daily (with dinner)       Family History:    Family History   Problem Relation Age of Onset   . Multiple Sclerosis Neg Hx    . Lupus Neg Hx    . Rheum arthritis Neg Hx    . Thyroid disease Neg Hx    . Diabetes Neg Hx    . Colon cancer Neg Hx    . Colon polyps Neg Hx    . High blood pressure Neg Hx    . Heart disease Mother      coronary art disease, not premature   . Cancer Father      multiple myeloma   . Cancer Maternal Grandmother      unknown       Social/Occupational History:   History     Social History   . Marital Status: Married     Spouse Name: N/A  Number of Children: N/A   . Years of Education: N/A     Social History Main Topics   . Smoking status: Never Smoker    . Smokeless tobacco: Never Used   . Alcohol Use: Yes      Comment: Very occasionaly   . Drug Use: No   . Sexually Active: None     Other Topics Concern   . 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        Exercise -- doubles tennis a couple of times weekly, "not much".        Diet -- varied, more lean proteins, complex carbs, not a lot of dessert, some greens.        Sleep -- active dreams, variable sleep success.  No GU interruption.        Safety -- bike helmet, seatbelt, smoke detectors, sunglasses, but inconsistent with skin block.       Review of Systems:    Review of Systems   Constitutional: Negative for fever, chills, weight loss, malaise/fatigue and diaphoresis.   HENT: Negative for hearing loss, nosebleeds, sore throat and tinnitus.    Eyes: Negative for blurred vision, double vision, photophobia and pain.   Respiratory: Negative for cough, hemoptysis, sputum production and stridor.         Mild exertional breathlessness in recent years unchanged.   Cardiovascular: Negative for chest pain, palpitations, orthopnea, claudication, leg swelling and PND.    Gastrointestinal: Negative for nausea, vomiting, abdominal pain, diarrhea, constipation, blood in stool and melena.        Rare heartburn, no dysphagia.   Genitourinary: Negative for dysuria, urgency, hematuria and flank pain.   Musculoskeletal: Negative for joint pain and falls.   Skin: Negative for itching and rash.        No active moles noted.   Neurological: Negative for dizziness, tingling, tremors, sensory change, speech change, focal weakness, seizures, loss of consciousness, weakness and headaches.   Endo/Heme/Allergies: Negative for polydipsia.   Psychiatric/Behavioral: Negative for depression and memory loss. The patient is not nervous/anxious and does not have insomnia.        Vital Signs:   BP 110/74  Pulse 71  Resp 16  Ht 1.803 m (5\' 11" )  Wt 79.379 kg (175 lb)  BMI 24.42 kg/m2  SpO2 97%      PHYSICAL EXAM:  Physical Exam   Constitutional: He is oriented to person, place, and time. He appears well-developed and well-nourished. No distress.   HENT:   Right Ear: External ear normal.   Left Ear: External ear normal.   Nose: Nose normal.   Mouth/Throat: Oropharynx is clear and moist. No oropharyngeal exudate.   Eyes: Conjunctivae and EOM are normal. No scleral icterus.   Neck: Normal range of motion. Neck supple. No JVD present. No tracheal deviation present. No thyromegaly present.   Cardiovascular: Normal rate, regular rhythm, normal heart sounds and intact distal pulses.  Exam reveals no gallop.    Pulmonary/Chest: Effort normal and breath sounds normal. No stridor. No respiratory distress. He has no wheezes. He has no rales. He exhibits no tenderness.   Abdominal: Soft. Bowel sounds are normal. He exhibits no distension and no mass. There is no hepatosplenomegaly. There is no tenderness.   Genitourinary: Rectum normal, prostate normal and penis normal. No penile tenderness.   Musculoskeletal: Normal range of motion. He exhibits no edema and no tenderness.  Lymphadenopathy:     He has no  cervical adenopathy.     He has no axillary adenopathy.        Right: No supraclavicular adenopathy present.        Left: No supraclavicular adenopathy present.   Neurological: He is alert and oriented to person, place, and time. He has normal reflexes. No cranial nerve deficit. Coordination normal.   Skin: Skin is warm and dry. No rash noted. He is not diaphoretic. No erythema.   No suspicious nevi.   Psychiatric: He has a normal mood and affect. His behavior is normal. Thought content normal.     Component      Latest Ref Rng 11/17/2012   Sodium      133 - 145 mmol/L 141   Potassium      3.3 - 5.1 mmol/L 4.4   Chloride      96 - 108 mmol/L 102   CO2      20 - 28 mmol/L 28   Anion Gap      7 - 16 11   UN      6 - 20 mg/dL 13   Creatinine      4.54 - 1.17 mg/dL 0.98   GFR,Caucasian       80   GFR,Black       92   Glucose      60 - 99 mg/dL 85   Calcium      8.6 - 10.2 mg/dL 9.0   Total Protein      6.3 - 7.7 g/dL 6.6   Albumin      3.5 - 5.2 g/dL 4.5   Bilirubin,Total      0.0 - 1.2 mg/dL 0.7   AST      0 - 50 U/L 37   ALT      0 - 50 U/L 33   Alk Phos      40 - 130 U/L 75   Cholesterol       224 (A)   Triglycerides       78   HDL       56   LDL Calculated       152   Non HDL Cholesterol       168   Chol/HDL Ratio       4.0   Magnesium      1.3 - 2.1 mEq/L 1.8   TSH      0.27 - 4.20 uIU/mL 2.48   PSA (eff. 01-2009)      0.00 - 4.00 ng/mL 0.58         Assessment:    Rannon was seen today for annual exam.    Diagnoses and associated orders for this visit:    Hyperlipidemia  - Lipid panel; Future  - ALT; Future  - CK; Future    Other Orders  - HM HIV SCREENING OFFERED  - atorvastatin (LIPITOR) 10 MG tablet; Take 1 tablet (10 mg total) by mouth daily (with dinner)      His chronic problems are generally quite stable as above, and regimen unchanged there.    We reviewed new CV risk profiling guidelines and indications for consideration of statin treatment.  His profile suggests that at 62 yo with current lipids, he may well  benefit from mod intensity statin.  He'd like to try although will maintain low threshold for stopping if any intrusive side effects, particularly muscular.    Discussed timing of cancer screening,  immunizations.  He has proxy on file.   .      Plan:        We agreed that will proceed with trial of atorvastatin.   You will watch for any untoward skeletal muscle side effects.   If tolerating well, would recommend you consider fasting blood test update after the first month.   We can consider going to 20 mg daily depending upon course.   Continue efforts with diet, exercise as discussed.

## 2013-01-02 ENCOUNTER — Other Ambulatory Visit: Payer: Self-pay | Admitting: Nurse Practitioner

## 2013-01-02 MED ORDER — DIMETHYL FUMARATE 240 MG PO CPDR *I*
240.0000 mg | DELAYED_RELEASE_CAPSULE | Freq: Two times a day (BID) | ORAL | Status: DC
Start: 2013-01-02 — End: 2013-07-17

## 2013-01-03 ENCOUNTER — Ambulatory Visit
Admit: 2013-01-03 | Discharge: 2013-01-03 | Disposition: A | Discharge status: Home / Self Care | Payer: Self-pay | Source: Ambulatory Visit | Attending: Primary Care | Admitting: Primary Care

## 2013-01-03 ENCOUNTER — Encounter: Payer: Self-pay | Admitting: Primary Care

## 2013-01-03 DIAGNOSIS — E785 Hyperlipidemia, unspecified: Secondary | ICD-10-CM

## 2013-01-03 LAB — LIPID PANEL
Chol/HDL Ratio: 2.8
Cholesterol: 149 mg/dL
HDL: 54 mg/dL
LDL Calculated: 79 mg/dL
Non HDL Cholesterol: 95 mg/dL
Triglycerides: 79 mg/dL

## 2013-01-03 LAB — ALT: ALT: 51 U/L — ABNORMAL HIGH (ref 0–50)

## 2013-01-03 LAB — CK: CK: 257 U/L — ABNORMAL HIGH (ref 46–171)

## 2013-01-04 ENCOUNTER — Encounter: Payer: Self-pay | Admitting: Primary Care

## 2013-03-29 ENCOUNTER — Encounter: Payer: Self-pay | Admitting: Gastroenterology

## 2013-04-27 ENCOUNTER — Ambulatory Visit: Payer: Self-pay | Admitting: Primary Care

## 2013-04-27 ENCOUNTER — Encounter: Payer: Self-pay | Admitting: Primary Care

## 2013-04-27 VITALS — BP 124/78 | HR 65 | Resp 12 | Wt 174.0 lb

## 2013-04-27 DIAGNOSIS — K219 Gastro-esophageal reflux disease without esophagitis: Secondary | ICD-10-CM

## 2013-04-27 DIAGNOSIS — R053 Chronic cough: Secondary | ICD-10-CM

## 2013-04-27 LAB — HM HIV SCREENING OFFERED

## 2013-04-27 MED ORDER — LANSOPRAZOLE 30 MG PO CPDR *I*
DELAYED_RELEASE_CAPSULE | ORAL | Status: DC
Start: 2013-04-27 — End: 2013-07-31

## 2013-04-27 NOTE — Progress Notes (Signed)
Subjective:     Patient ID: Deaire Clennon is a 62 y.o. male.    HPI  2 months of nagging, dry, nonproductive cough has failed to respond to maximal anti-inflammatory and bronchodilating treatments.  Chest x-ray a month ago was unrevealing.  Intercurrent sinusitis was treated with neti pot, antibiotic, cleared with no apparent effect upon the cough.  Dry, intrusive paroxysms continue.  He is not aware of current postnasal drip.  He is not wheezing.  Benzonatate does little.    Patient's medications, allergies, past medical, surgical, social and family histories were reviewed and updated as appropriate.  He remains a nonsmoker.  He notes no environmental changes, no recent travel outside the area.    Review of Systems  No fever or night sweats, no voice changes, no dysphagia or melena.  No pleuritic chest pain, no exercise intolerance.        Objective:   Physical Exam   Constitutional: He appears well-developed and well-nourished.   HENT:   Right Ear: External ear normal.   Left Ear: External ear normal.   Nose: Nose normal.   Mouth/Throat: Oropharynx is clear and moist.   Eyes: Conjunctivae are normal. No scleral icterus.   Neck: Neck supple. No tracheal deviation present.   Cardiovascular: Normal rate, regular rhythm and normal heart sounds.    Pulmonary/Chest: Effort normal and breath sounds normal. No stridor. No respiratory distress. He has no wheezes. He has no rales.   Abdominal: Soft. There is no tenderness.   Musculoskeletal: He exhibits no edema.   Lymphadenopathy:     He has no cervical adenopathy.     He has no axillary adenopathy.        Right: No supraclavicular adenopathy present.        Left: No supraclavicular adenopathy present.   Skin: He is not diaphoretic.     Allergy note from Dr. Yetta Barre [facility]), June 2014, reviewed.        Assessment:      He's had pretty maximal airway treatment for bronchospasm and inflammation, with NO response.  CXR reportedly "clear" per patient.  He is on chronic nasal steroid  and not aware of ANY postnasal drainage.  There is nothing new in his environment.  Exam is consistent with ongoing inflammation fo throat/vocal cords/airways.  Possibilities include ongoing injury from acid reflux, anatomical bronchial lesion.    We ultimately agreed that would complete prescribed treatment from allergist, then taper ongoing asthma medicines per his direction, while introducing lansoprazole to reduce nocturnal stomach acid, optimizing antireflux measures (e-mail of UpToDate "beyond the basics").  Give it three weeks, report via MyChart, consider endoscopic evaluation of UGI tract, bronchi depending upon response.    Majority of this 25+ min visit was counsel.         Plan:      As above.

## 2013-05-10 ENCOUNTER — Encounter: Payer: Self-pay | Admitting: Primary Care

## 2013-05-11 ENCOUNTER — Encounter: Payer: Self-pay | Admitting: Gastroenterology

## 2013-05-14 ENCOUNTER — Encounter: Payer: Self-pay | Admitting: Primary Care

## 2013-05-14 DIAGNOSIS — K219 Gastro-esophageal reflux disease without esophagitis: Secondary | ICD-10-CM | POA: Insufficient documentation

## 2013-05-14 DIAGNOSIS — R053 Chronic cough: Secondary | ICD-10-CM | POA: Insufficient documentation

## 2013-05-20 ENCOUNTER — Other Ambulatory Visit: Payer: Self-pay | Admitting: Primary Care

## 2013-05-22 ENCOUNTER — Telehealth: Payer: Self-pay | Admitting: Neurology

## 2013-05-22 NOTE — Telephone Encounter (Signed)
Patient cancels annual on 9.4 with Dr. Ardelia Mems. Can he be added in to upcoming schedule, which is available only through December and has no fuv slots open. NPV could be converted or add on as directed by Dr. Ardelia Mems if necessary. States he has been on Tecfedera for a year  (last seen 4.3.13)and needs to be seen soon.

## 2013-05-22 NOTE — Telephone Encounter (Signed)
It's fine to split a NPV into 2 FUA. Thanks

## 2013-05-23 NOTE — Telephone Encounter (Signed)
Confirms for 10.20 @ 1 pm with Dr. Ardelia Mems.

## 2013-06-15 ENCOUNTER — Ambulatory Visit: Payer: Self-pay | Admitting: Neurology

## 2013-06-21 ENCOUNTER — Encounter: Payer: Self-pay | Admitting: Gastroenterology

## 2013-06-30 ENCOUNTER — Other Ambulatory Visit: Payer: Self-pay | Admitting: Primary Care

## 2013-07-17 ENCOUNTER — Other Ambulatory Visit: Payer: Self-pay | Admitting: Nurse Practitioner

## 2013-07-17 MED ORDER — DIMETHYL FUMARATE 240 MG PO CPDR *I*
240.0000 mg | DELAYED_RELEASE_CAPSULE | Freq: Two times a day (BID) | ORAL | Status: DC
Start: 2013-07-17 — End: 2014-01-26

## 2013-07-31 ENCOUNTER — Ambulatory Visit: Payer: Self-pay | Admitting: Neurology

## 2013-07-31 ENCOUNTER — Encounter: Payer: Self-pay | Admitting: Neurology

## 2013-07-31 VITALS — BP 118/75 | HR 62 | Ht 71.0 in | Wt 175.0 lb

## 2013-07-31 DIAGNOSIS — G35 Multiple sclerosis: Secondary | ICD-10-CM

## 2013-07-31 LAB — CBC AND DIFFERENTIAL
Baso # K/uL: 0 10*3/uL (ref 0.0–0.1)
Basophil %: 0.2 % (ref 0.2–1.2)
Eos # K/uL: 0.1 10*3/uL (ref 0.0–0.5)
Eosinophil %: 1.7 % (ref 0.8–7.0)
Hematocrit: 43 % (ref 40–51)
Hemoglobin: 15.2 g/dL (ref 13.7–17.5)
Lymph # K/uL: 1.2 10*3/uL — ABNORMAL LOW (ref 1.3–3.6)
Lymphocyte %: 20.6 % — ABNORMAL LOW (ref 21.8–53.1)
MCV: 87 fL (ref 79–92)
Mono # K/uL: 0.7 10*3/uL (ref 0.3–0.8)
Monocyte %: 11.3 % (ref 5.3–12.2)
Neut # K/uL: 3.9 10*3/uL (ref 1.8–5.4)
Platelets: 191 10*3/uL (ref 150–330)
RBC: 5 MIL/uL (ref 4.6–6.1)
RDW: 12.8 % (ref 11.6–14.4)
Seg Neut %: 66.2 % (ref 34.0–67.9)
WBC: 5.9 10*3/uL (ref 4.2–9.1)

## 2013-07-31 LAB — HEPATIC FUNCTION PANEL
ALT: 36 U/L (ref 0–50)
AST: 39 U/L (ref 0–50)
Albumin: 4.6 g/dL (ref 3.5–5.2)
Alk Phos: 74 U/L (ref 40–130)
Bili,Indirect: 0.7 mg/dL
Bilirubin,Direct: 0.3 mg/dL (ref 0.0–0.3)
Bilirubin,Total: 1 mg/dL (ref 0.0–1.2)
Total Protein: 6.9 g/dL (ref 6.3–7.7)

## 2013-07-31 NOTE — Progress Notes (Signed)
[facility] Follow-Up    Subjective:  [name] is a 62 y.o. M here for a regular follow-up of relapsing remitting MS. Since his 4/13 clinic visit he has not had any new neurologic symptoms. He reports increased frequency of facial tics, which are not new. There may be increased stress, which can worsen the frequency of tics. He is not interested in medications to treat this issue, that does not include verbal tics (ongoing since childhood, able to temporarily suppress, no vocal tics.)  He started Tecfidera 5/13. He has ongoing flushing, feeling red, warm and itchy. These episodes occur intermittently, typically less than 1x/week. He is not interested in taking ASA for this.  He has not been having leg cramps at bedtime, so has not been taking baclofen.  He continues playing tennis.  Cialis continues being effective for ED.  Energy is "so-so."  He finds that at the end of the work day he is tired,but continues to do his work. A cup of coffee daily around 2-3pm helps. He does not feel that he does not need a symptomatic medication for this. Naps on the weekends are helpful.  He denies cognitive issues,urinary symptoms and constipation.  He has ongoing numbness in his fingers that is not painful.     MS History:  Initial clinical presentation:1990's R arm weakness  Later clinical presentations:numbness hands and feet  Initial MRI:  Follow-up MRI:4/08 brain-relatively mild lesion burden, 1 enhancing lesion at the time               5/13 brain-no new lesions  Other testing:LFT WNL 2/14, no recent CBC  Disease modifying agent treatment RU:[id number] started in 1990's, stopped due to injection site reactions/infections          Tecfidera started 5/13 and continued through the present  Symptomatic treatment hx:ED-Cialis helps             Foot cramps-baclofen can be helpful    Past Medical History   Diagnosis Date   . Multiple sclerosis    . HLD (hyperlipidemia)    . Psoriasis      Family History   Problem  Relation Age of Onset   . Multiple Sclerosis Neg Hx    . Lupus Neg Hx    . Rheum arthritis Neg Hx    . Thyroid disease Neg Hx    . Diabetes Neg Hx    . Colon cancer Neg Hx    . Colon polyps Neg Hx    . High blood pressure Neg Hx    . Heart disease Mother      coronary art disease, not premature   . Cancer Father      multiple myeloma   . Cancer Maternal Grandmother      unknown     History     Social History   . Marital Status: Married     Social History Main Topics   . 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         Meds:  Current Outpatient Prescriptions on File Prior to Visit   Medication Sig Dispense Refill   . dimethyl fumarate (TECFIDERA) 240 MG DR capsule Take 1 capsule (240 mg total) by mouth 2 times daily  60 capsule  5   . CIALIS 5 MG tablet TAKE 1 TABLET BY MOUTH EVERY DAY  30 tablet  4   .  atorvastatin (LIPITOR) 10 MG tablet TAKE 1 TABLET (10 MG TOTAL) BY MOUTH DAILY (WITH DINNER)  30 tablet  5   . cholecalciferol (VITAMIN D) 1000 UNIT tablet Take 1,000 Units by mouth daily       . aqscorbic acid (VITAMIN C) 1000 MG tablet Take 1,000 mg by mouth daily       . Cyanocobalamin (VITAMIN B-12 CR PO) Take by mouth       . fluticasone-salmeterol (ADVAIR DISKUS) 500-50 MCG/DOSE diskus inhaler        . montelukast (SINGULAIR) 10 MG tablet        . tiotropium (SPIRIVA HANDIHALER) 18 MCG inhalation capsule        . fluticasone (FLONASE) 50 MCG/ACT nasal spray 2 sprays by Nasal route daily as needed       . albuterol (PROVENTIL HFA) 108 (90 BASE) MCG/ACT inhaler INHALE 2 PUFFS EVERY 6 HOURS AS NEEDED  7  0     No current facility-administered medications on file prior to visit.        ROS:  10 point ROS negative other than per HPI    Vitals  Blood pressure 118/75, pulse 62, height 1.803 m (5\' 11" ), weight 79.379 kg (175 lb).    PE:  Gen: Well appearing, NAD.  MS: Alert and oriented. Fluent. Affect appropriate for situation.  CN:  Pupils 4 to 3mm without APD.  Full EOM, on adduction, eyes deviate downwards on endgaze bilaterally. Full facial strength and sensation. Hearing full to soft voice. Palate elevation symmetric. Tongue midline.  Motor: Normal bulk and tone. 5/5 strength UE and LE.  DTRs: 2+ biceps, BR, 3+patellars and 2+ankles.   Sens: Moderately decreased vibration in bilateral toes.  Coord: No ataxia on FTN.  Gait: Mildly spastic casual gait. Large sway on Romberg. 25 ft walk 3.30 sec without assistive device.    Assesment and Plan:  62 y.o. M with relatively mild relapsing remitting MS that has been stable clinically on Tecfidera as DMT. We will repeat an MRI brain to make sure he has not developed new silent lesions. He understands the importance of Q3 month CBC and LFT monitoring while on Tecfidera. Will continue Cialis, but not other symptomatic medications are needed. He will call if he wants meds later for mild burning in feet intermittently, for tic disorder or for fatigue.     Continue Tecfidera as DMT   MRI brain   CBC and LFT Q3 month   Cialis as symptomatic med   Return to clinic in 1 year or sooner        Michale Weikel, MD

## 2013-07-31 NOTE — Patient Instructions (Signed)
Please have your blood drawn (blood count and liver function tests) in the next 1-2 weeks and then repeat every 3 months while on Tecfidera. This is looking for low white blood cell (infection fighting) cell counts.    You'll be contacted to set up your MRI brain scan. Call or send a message to check about the results 1-2 days later.    Keep up the good work with the exercise.

## 2013-08-21 ENCOUNTER — Ambulatory Visit
Admit: 2013-08-21 | Discharge: 2013-08-21 | Disposition: A | Discharge status: Home / Self Care | Payer: Self-pay | Source: Ambulatory Visit | Attending: Neurology | Admitting: Neurology

## 2013-08-21 LAB — POCT CREATININE
Creatinine, POCT: 1.1 mg/dL (ref 0.67–1.17)
GFR,Black POC: 82 *
GFR,Other POC: 71 *

## 2013-08-24 ENCOUNTER — Encounter: Payer: Self-pay | Admitting: Primary Care

## 2013-08-25 ENCOUNTER — Encounter: Payer: Self-pay | Admitting: Neurology

## 2013-10-31 ENCOUNTER — Ambulatory Visit
Admit: 2013-10-31 | Discharge: 2013-10-31 | Disposition: A | Discharge status: Home / Self Care | Payer: Self-pay | Source: Ambulatory Visit | Attending: Neurology | Admitting: Neurology

## 2013-10-31 DIAGNOSIS — G35 Multiple sclerosis: Secondary | ICD-10-CM

## 2013-10-31 LAB — CBC AND DIFFERENTIAL
Baso # K/uL: 0 10*3/uL (ref 0.0–0.1)
Basophil %: 0.4 % (ref 0.2–1.2)
Eos # K/uL: 0.1 10*3/uL (ref 0.0–0.5)
Eosinophil %: 2 % (ref 0.8–7.0)
Hematocrit: 41 % (ref 40–51)
Hemoglobin: 14.5 g/dL (ref 13.7–17.5)
Lymph # K/uL: 0.9 10*3/uL — ABNORMAL LOW (ref 1.3–3.6)
Lymphocyte %: 20.9 % — ABNORMAL LOW (ref 21.8–53.1)
MCH: 30 pg/cell (ref 26–32)
MCHC: 35 g/dL (ref 32–37)
MCV: 85 fL (ref 79–92)
Mono # K/uL: 0.6 10*3/uL (ref 0.3–0.8)
Monocyte %: 12.7 % — ABNORMAL HIGH (ref 5.3–12.2)
Neut # K/uL: 2.9 10*3/uL (ref 1.8–5.4)
Platelets: 175 10*3/uL (ref 150–330)
RBC: 4.8 MIL/uL (ref 4.6–6.1)
RDW: 12.7 % (ref 11.6–14.4)
Seg Neut %: 64 % (ref 34.0–67.9)
WBC: 4.5 10*3/uL (ref 4.2–9.1)

## 2013-10-31 LAB — HEPATIC FUNCTION PANEL
ALT: 34 U/L (ref 0–50)
AST: 39 U/L (ref 0–50)
Albumin: 4.4 g/dL (ref 3.5–5.2)
Alk Phos: 69 U/L (ref 40–130)
Bilirubin,Direct: 0.2 mg/dL (ref 0.0–0.3)
Bilirubin,Total: 0.6 mg/dL (ref 0.0–1.2)
Total Protein: 6.2 g/dL — ABNORMAL LOW (ref 6.3–7.7)

## 2013-11-13 ENCOUNTER — Other Ambulatory Visit: Payer: Self-pay | Admitting: Primary Care

## 2013-12-12 ENCOUNTER — Other Ambulatory Visit: Payer: Self-pay | Admitting: Primary Care

## 2013-12-12 DIAGNOSIS — E785 Hyperlipidemia, unspecified: Secondary | ICD-10-CM

## 2013-12-12 DIAGNOSIS — Z1159 Encounter for screening for other viral diseases: Secondary | ICD-10-CM

## 2013-12-13 ENCOUNTER — Other Ambulatory Visit: Payer: Self-pay | Admitting: Primary Care

## 2013-12-14 ENCOUNTER — Encounter: Payer: Self-pay | Admitting: Primary Care

## 2013-12-14 DIAGNOSIS — Z139 Encounter for screening, unspecified: Secondary | ICD-10-CM

## 2013-12-14 NOTE — Telephone Encounter (Signed)
Ilhan returned call to the office and he states has not had Hep C completed and will have done with next blood draw.  Br Budd aware and order has been entered.

## 2013-12-14 NOTE — Telephone Encounter (Signed)
Needs at least annual fasting lipid panel on statin, ordered.  Also should have hep C screening if has not had through a blood donation at [facility].  Thanks.

## 2013-12-14 NOTE — Telephone Encounter (Signed)
I'm guessing you can handle this one, Lurena Joiner.  Thanks.

## 2013-12-14 NOTE — Telephone Encounter (Signed)
You were right :)  I was wrong :(

## 2013-12-14 NOTE — Telephone Encounter (Signed)
I called and LM, asked Keo to return call to the office to discuss have labs completed.

## 2013-12-14 NOTE — Telephone Encounter (Signed)
Order entered

## 2013-12-14 NOTE — Telephone Encounter (Signed)
replayed to patient  Please sign orders,  Thank you

## 2013-12-15 ENCOUNTER — Ambulatory Visit
Admit: 2013-12-15 | Discharge: 2013-12-15 | Disposition: A | Discharge status: Home / Self Care | Payer: Self-pay | Source: Ambulatory Visit | Attending: Primary Care | Admitting: Primary Care

## 2013-12-15 DIAGNOSIS — Z1159 Encounter for screening for other viral diseases: Secondary | ICD-10-CM

## 2013-12-15 DIAGNOSIS — E785 Hyperlipidemia, unspecified: Secondary | ICD-10-CM

## 2013-12-15 LAB — LIPID PANEL
Chol/HDL Ratio: 2.5
Cholesterol: 144 mg/dL
HDL: 57 mg/dL
LDL Calculated: 74 mg/dL
Non HDL Cholesterol: 87 mg/dL
Triglycerides: 64 mg/dL

## 2013-12-16 LAB — HEPATITIS C ANTIBODY: Hep C Ab: NEGATIVE

## 2014-01-11 ENCOUNTER — Encounter: Payer: Self-pay | Admitting: Primary Care

## 2014-01-11 ENCOUNTER — Ambulatory Visit: Payer: Self-pay | Admitting: Primary Care

## 2014-01-11 VITALS — BP 102/72 | HR 80 | Temp 100.1°F | Resp 12 | Wt 178.0 lb

## 2014-01-11 DIAGNOSIS — J069 Acute upper respiratory infection, unspecified: Secondary | ICD-10-CM

## 2014-01-11 DIAGNOSIS — J111 Influenza due to unidentified influenza virus with other respiratory manifestations: Secondary | ICD-10-CM

## 2014-01-11 LAB — HM HIV SCREENING OFFERED

## 2014-01-11 NOTE — Progress Notes (Signed)
Subjective:     Patient ID: [name] is a 63 y.o. male.    HPI  Fatigue, anorexia, cough, fever over most of past week.  Body aches, little if any sputum, no N/V/D, no stiff neck or swollen glands, no painful swallowing.    Patient's medications, allergies, past medical, surgical, social and family histories were reviewed and updated as appropriate.  Nonsmoker.    Review of Systems          Objective:   Physical Exam  Looks tired, uncomfortable, feels a bit warm.  Conjunct clear, normal TM's, nares patent.  No throat exudate, neck supple with no adenopathy palp, lungs clear.  No abd organomegaly.        Assessment:      With fever, myalgias, nonproductive cough and with flu B in community, have to consider that a likely DX here.  Nasopharyngeal swab submitted for flu testing, and will focus upon hydration, symptom management pending htat result.  Already 72+ hours out, so Tamiflu unlikely helpful for him, and no sign of inspissated secretions for now.  Discussed contagion, option of Tamiflu prophylaxis for contacts if flu test positive.         Plan:      Await flu test result, manage symptoms as above.

## 2014-01-12 ENCOUNTER — Encounter: Payer: Self-pay | Admitting: Primary Care

## 2014-01-12 LAB — INFLUENZA  A & B/RSV PCR: Influenza A&B/RSV PCR: POSITIVE

## 2014-01-14 ENCOUNTER — Encounter: Payer: Self-pay | Admitting: Primary Care

## 2014-01-18 ENCOUNTER — Ambulatory Visit: Payer: Self-pay | Admitting: Primary Care

## 2014-01-18 ENCOUNTER — Telehealth: Payer: Self-pay | Admitting: Primary Care

## 2014-01-18 VITALS — BP 116/76 | HR 68 | Temp 98.0°F | Resp 12 | Wt 177.0 lb

## 2014-01-18 DIAGNOSIS — J069 Acute upper respiratory infection, unspecified: Secondary | ICD-10-CM

## 2014-01-18 DIAGNOSIS — J029 Acute pharyngitis, unspecified: Secondary | ICD-10-CM

## 2014-01-18 LAB — POCT AMBULATORY RAPID STREP
Lot #: 141193
Rapid Strep Group A Throat-POC: NEGATIVE

## 2014-01-18 LAB — HM HIV SCREENING OFFERED

## 2014-01-18 NOTE — Telephone Encounter (Signed)
Patient called, wanted to talk to nurse to find out if he needed appt for his sore throat. Please call [name] at phone # [phone] to discuss further. Thanks

## 2014-01-18 NOTE — Telephone Encounter (Signed)
I called and spoke with [name],  Appointment schedule for today at 11:40 AM

## 2014-01-18 NOTE — Progress Notes (Signed)
Subjective:     Patient ID: [name] is a 63 y.o. male.    HPI  Influenza syndrome much better, and back to work.  In past 24 hrs notes sore throat, swollen glands in upper neck, wants to rule out need for Rx.  No high temp, no chills, no nausea or altered digestive patterns.  No cough, sputum, dyspnea; notes generalized flush from his MS drug, but no rash otherwise.    Patient's medications, allergies, past medical, surgical, social and family histories were reviewed and updated as appropriate.  No specific sick contacts.    Review of Systems          Objective:   Physical Exam   Looks mildly uncomfortable, but no conjunct or TM injection, no throat exudate, widely patent and symmetric post pharynx, supple neck, clear lungs.    Rapid strep Ag test negative today in office.        Assessment:      Generalized flush from his MS drug is not unusual for [name], itchy, typical.  Pharyngitis not associated with any constriction, asymmetry, exudate or stridor, and with neg RAT am not inclined to do more than symptomatic management for now.          Plan:      Hydration, NSAID as tolerated, salt water gargle.

## 2014-01-25 ENCOUNTER — Telehealth: Payer: Self-pay | Admitting: Nurse Practitioner

## 2014-01-25 NOTE — Telephone Encounter (Signed)
Yes.  Okay to fill Tecfidera.

## 2014-01-25 NOTE — Telephone Encounter (Signed)
Please advise if labs are ok to refill tecfidera

## 2014-01-26 ENCOUNTER — Other Ambulatory Visit: Payer: Self-pay | Admitting: Nurse Practitioner

## 2014-01-26 MED ORDER — DIMETHYL FUMARATE 240 MG PO CPDR *I*
240.0000 mg | DELAYED_RELEASE_CAPSULE | Freq: Two times a day (BID) | ORAL | Status: DC
Start: 2014-01-26 — End: 2014-07-19

## 2014-01-30 ENCOUNTER — Other Ambulatory Visit: Payer: Self-pay | Admitting: Orthopedic Surgery

## 2014-01-30 DIAGNOSIS — M25561 Pain in right knee: Secondary | ICD-10-CM

## 2014-01-31 ENCOUNTER — Encounter: Payer: Self-pay | Admitting: Neurology

## 2014-02-01 ENCOUNTER — Ambulatory Visit: Payer: Self-pay | Admitting: Sports Medicine

## 2014-02-01 ENCOUNTER — Ambulatory Visit: Payer: Self-pay | Admitting: Orthopedic Surgery

## 2014-02-01 ENCOUNTER — Encounter: Payer: Self-pay | Admitting: Sports Medicine

## 2014-02-01 VITALS — BP 97/70 | Ht 71.0 in | Wt 179.0 lb

## 2014-02-01 DIAGNOSIS — M1711 Unilateral primary osteoarthritis, right knee: Secondary | ICD-10-CM

## 2014-02-01 DIAGNOSIS — IMO0002 Reserved for concepts with insufficient information to code with codable children: Secondary | ICD-10-CM

## 2014-02-01 HISTORY — DX: Unilateral primary osteoarthritis, right knee: M17.11

## 2014-02-01 NOTE — Progress Notes (Signed)
History: [name] is a 63 y.o. who we have seen previously is here today complaining of right knee pain.  He states that the last 6 months it has been progressively getting worse. In the past he has treated this with cortisone injections.  He would like to repeat that today, the last injection was in 2013.    Physical Examination:  he is in no acute distress.   he is alert and oriented x 3.   Examination of his right leg: Extension is to 0 and flexion is to 120.  Effusion: negative.  The knee was tender along the Medial joint line.   Distally he is neurovascularly intact.     Imaging: I personally reviewed the patients images. Xray's demonstrates medial compartment OA.    Assessment: 63 y.o. with right knee OA.    Plan: We discussed the diagnosis of osteoarthritis and methods of treatment including maintaining a healthy weight, relative rest and activity modification, the use of NSAIDS and neutraceuticals such as glucosamine and chondroitin, as well as the role of therapeutic exercise including directed physical therapy, aquatic therapy, or a home exercise program. We discussed the role of bracing, corticosteriod injection, and viscosupplementation. He will proceed with a cortisone injection.  He will call the office if he wants to proceed with Orthovisc injection.    PROCEDURE:  Following informed consent, using a sterile technique, 38ml of 1% Lidocaine and 75ml of celestrone was injected into the patient's right knee.  The patient tolerated this without complication.  The patient was given our post-injection instruction sheet.      Scott Roides ATC performed the duties of a scribe for this encounter in the presence of the documenting physician. This patient was seen by Dr.Cintya Daughety who personally determined the plan of care.

## 2014-02-01 NOTE — Patient Instructions (Signed)
Injection Instructions    · The injection you received today may take 5-7 days to work.  · You may find the area that was injected to be more painful for 1 or 2 days after the injection.  This happens as the medicine is being absorbed in your body.  · It may be helpful to put ice on the body part that was injected to ease the pain.  · You may also use pain medication - Tylenol, Advil/Motrin or Aleve.  · Remember, it may take 5-7 days for you to feel the full results of the injection.  · If you are diabetic the medicine in the injection can increase your blood sugar level for several days.  Monitor your glucose levels closely.

## 2014-02-06 ENCOUNTER — Encounter: Payer: Self-pay | Admitting: Neurology

## 2014-02-07 ENCOUNTER — Other Ambulatory Visit: Payer: Self-pay | Admitting: Neurology

## 2014-02-07 DIAGNOSIS — G35 Multiple sclerosis: Secondary | ICD-10-CM

## 2014-02-14 ENCOUNTER — Ambulatory Visit
Admit: 2014-02-14 | Discharge: 2014-02-14 | Disposition: A | Discharge status: Home / Self Care | Payer: Self-pay | Source: Ambulatory Visit | Attending: Neurology | Admitting: Neurology

## 2014-02-14 DIAGNOSIS — G35 Multiple sclerosis: Secondary | ICD-10-CM

## 2014-02-14 LAB — HEPATIC FUNCTION PANEL
ALT: 36 U/L (ref 0–50)
AST: 34 U/L (ref 0–50)
Albumin: 4.3 g/dL (ref 3.5–5.2)
Alk Phos: 73 U/L (ref 40–130)
Bilirubin,Direct: 0.2 mg/dL (ref 0.0–0.3)
Bilirubin,Total: 0.6 mg/dL (ref 0.0–1.2)
Total Protein: 6.4 g/dL (ref 6.3–7.7)

## 2014-02-14 LAB — CBC AND DIFFERENTIAL
Baso # K/uL: 0 10*3/uL (ref 0.0–0.1)
Basophil %: 0.2 % (ref 0.2–1.2)
Eos # K/uL: 0.1 10*3/uL (ref 0.0–0.5)
Eosinophil %: 2.2 % (ref 0.8–7.0)
Hematocrit: 42 % (ref 40–51)
Hemoglobin: 14.6 g/dL (ref 13.7–17.5)
Lymph # K/uL: 1 10*3/uL — ABNORMAL LOW (ref 1.3–3.6)
Lymphocyte %: 16.1 % — ABNORMAL LOW (ref 21.8–53.1)
MCH: 31 pg/cell (ref 26–32)
MCHC: 35 g/dL (ref 32–37)
MCV: 87 fL (ref 79–92)
Mono # K/uL: 0.6 10*3/uL (ref 0.3–0.8)
Monocyte %: 10.7 % (ref 5.3–12.2)
Neut # K/uL: 4.2 10*3/uL (ref 1.8–5.4)
Platelets: 173 10*3/uL (ref 150–330)
RBC: 4.8 MIL/uL (ref 4.6–6.1)
RDW: 13 % (ref 11.6–14.4)
Seg Neut %: 70.8 % — ABNORMAL HIGH (ref 34.0–67.9)
WBC: 6 10*3/uL (ref 4.2–9.1)

## 2014-03-16 ENCOUNTER — Ambulatory Visit: Payer: Self-pay | Admitting: Orthopedic Surgery

## 2014-03-16 VITALS — BP 118/74 | Ht 71.0 in | Wt 180.0 lb

## 2014-03-16 DIAGNOSIS — IMO0002 Reserved for concepts with insufficient information to code with codable children: Secondary | ICD-10-CM

## 2014-03-16 NOTE — Procedures (Signed)
Procedure :  Ultrasound guided joint aspiration/injection of the right knee.    Examiner : Jourdan Durbin LISSOW, PA    Equipment : Sonosite M-Turbo, [employer]    CPT Code : 76942, 20610    ICD- 9 Code : 715.96 osteo    Indications for the use of musculoskeletal ultrasound:  Ultrasound was utilized to improve needle visualization, injection accuracy, and anatomic localization as supported in the literature by numerous studies,  and within Medicare guidelines.     Erika L. Daley, BS, Sarvottam Bajaj, BE, Leslie J. Bissom, MD, and Brian J. Cole, MD, MBA. Improving Injection Accuracy of the Elbow, Knee, and Shoulder. Does Injection Site and Imaging Make a Difference? A Systematic Review. The American Journal of Sports Medicine, Vol. 39, No. 3, 2011. DOI: 10.1177/0363546510390610.    Technique: After pre-procedure checks the patient was appropriately positioned. The injection site was sterilized with Chlorahexadine. The transducer was sterilized with a chlorascrub maxi swab stick. Ultrasound imagery of the injection site was obtained with the Sonosite Edge and appropriated images were labeled, saved and permanently archived.    Findings : With the patient in the upright position the linear transducer was utilized to visualize the right knee joint space via an anterior window.  Local anesthesia was provided then via an  Anterolateral approach and under continuous ultrasound guidance a 20 gauge 1.5 inch needle was inserted into the joint.  Ultrasound needle tip placement verification was obtained then 1ampule of Monovisc and 3cc of  1% plain lidocaine was injected.    Impression : Ultrasound guided joint aspiration/injection of the right knee for knee osteoarthritis.

## 2014-03-16 NOTE — Progress Notes (Signed)
YYQ#:[id number]

## 2014-03-16 NOTE — Progress Notes (Signed)
[name], [name]  MR #:  [id number]   ACCOUNT #:  [id number] DOB:  02-04-1951   DICTATED BY:  Raechel Ache, PA DATE OF VISIT:  03/16/2014     CHIEF COMPLAINT:  Right knee osteoarthritis.    INTERVAL HISTORY:  The patient presents here today for his right knee osteoarthritis, wanting to go forth with Monovisc injection.  Risks and benefits, including chance of infection, were discussed with the patient, and he is ready to go forth.  Please see separate procedure note, as this was completed under ultrasound guidance.    ASSESSMENT:  Right knee osteoarthritis, Monovisc completed today.    PLAN:  The patient will decrease activities, use ice for discomfort, and follow up in 6-8 weeks to let us know how he is doing.       Dictated By:  Raechel Ache, PA      ______________________________  Lanae Boast, MD    ML/MODL  DD:  03/16/2014 09:56:39  DT:  03/16/2014 10:49:05  Job #:  [id number]    cc: Lanae Boast, MD

## 2014-03-19 ENCOUNTER — Other Ambulatory Visit: Payer: Self-pay | Admitting: Primary Care

## 2014-04-02 ENCOUNTER — Encounter: Payer: Self-pay | Admitting: Gastroenterology

## 2014-05-29 ENCOUNTER — Encounter: Payer: Self-pay | Admitting: Primary Care

## 2014-05-29 ENCOUNTER — Encounter: Payer: Self-pay | Admitting: Gastroenterology

## 2014-05-29 LAB — CBC AND DIFFERENTIAL
Baso # K/uL: 0 /uL
Eos # K/uL: 0 /uL
Hematocrit: 43
Hemoglobin: 14.8
Lymph # K/uL: 1 /uL
MCV: 89
Mono # K/uL: 0.7 /uL
Neut # K/uL: 3.3 /uL
Platelets: 186
RBC: 4.88
RDW: 13
WBC: 5.1

## 2014-05-31 ENCOUNTER — Encounter: Payer: Self-pay | Admitting: Primary Care

## 2014-05-31 ENCOUNTER — Telehealth: Payer: Self-pay | Admitting: Primary Care

## 2014-05-31 NOTE — Telephone Encounter (Signed)
I called the help desk and explained that results need to be removed.   They want to remote in and need to do that with Dr. Mliss Fritz.  Please call [phone] and ticket # [phone] and they will help remove

## 2014-05-31 NOTE — Telephone Encounter (Signed)
-----   Message from Ruby Cola, MD sent at 05/31/2014  7:53 AM EDT -----  The metabolic panel and lipid profile are NOT HIS.  Is there a way to get them removed from his file?  Please advise.

## 2014-06-20 ENCOUNTER — Other Ambulatory Visit: Payer: Self-pay | Admitting: Primary Care

## 2014-07-12 ENCOUNTER — Encounter: Payer: Self-pay | Admitting: Primary Care

## 2014-07-19 ENCOUNTER — Other Ambulatory Visit: Payer: Self-pay | Admitting: Neurology

## 2014-07-19 MED ORDER — DIMETHYL FUMARATE 240 MG PO CPDR *I*
240.0000 mg | DELAYED_RELEASE_CAPSULE | Freq: Two times a day (BID) | ORAL | Status: DC
Start: 2014-07-19 — End: 2015-01-24

## 2014-07-26 ENCOUNTER — Encounter: Payer: Self-pay | Admitting: Primary Care

## 2014-07-26 DIAGNOSIS — E785 Hyperlipidemia, unspecified: Secondary | ICD-10-CM

## 2014-07-26 DIAGNOSIS — R252 Cramp and spasm: Secondary | ICD-10-CM

## 2014-07-27 ENCOUNTER — Ambulatory Visit
Admit: 2014-07-27 | Discharge: 2014-07-27 | Disposition: A | Discharge status: Home / Self Care | Payer: Self-pay | Source: Ambulatory Visit | Attending: Primary Care | Admitting: Primary Care

## 2014-07-27 ENCOUNTER — Encounter: Payer: Self-pay | Admitting: Primary Care

## 2014-07-27 DIAGNOSIS — E785 Hyperlipidemia, unspecified: Secondary | ICD-10-CM

## 2014-07-27 DIAGNOSIS — R252 Cramp and spasm: Secondary | ICD-10-CM

## 2014-07-27 DIAGNOSIS — G35 Multiple sclerosis: Secondary | ICD-10-CM

## 2014-07-27 LAB — COMPREHENSIVE METABOLIC PANEL
ALT: 39 U/L (ref 0–50)
AST: 39 U/L (ref 0–50)
Albumin: 4.4 g/dL (ref 3.5–5.2)
Alk Phos: 74 U/L (ref 40–130)
Anion Gap: 13 (ref 7–16)
Bilirubin,Total: 0.7 mg/dL (ref 0.0–1.2)
CO2: 29 mmol/L — ABNORMAL HIGH (ref 20–28)
Calcium: 8.9 mg/dL (ref 8.6–10.2)
Chloride: 101 mmol/L (ref 96–108)
Creatinine: 1.05 mg/dL (ref 0.67–1.17)
GFR,Black: 86 *
GFR,Caucasian: 74 *
Glucose: 130 mg/dL — ABNORMAL HIGH (ref 60–99)
Lab: 13 mg/dL (ref 6–20)
Potassium: 3.9 mmol/L (ref 3.3–5.1)
Sodium: 143 mmol/L (ref 133–145)
Total Protein: 6.6 g/dL (ref 6.3–7.7)

## 2014-07-27 LAB — CBC AND DIFFERENTIAL
Baso # K/uL: 0 10*3/uL (ref 0.0–0.1)
Basophil %: 0.7 %
Eos # K/uL: 0.1 10*3/uL (ref 0.0–0.5)
Eosinophil %: 2.2 %
Hematocrit: 44 % (ref 40–51)
Hemoglobin: 14.9 g/dL (ref 13.7–17.5)
IMM Granulocytes #: 0 10*3/uL (ref 0.0–0.1)
IMM Granulocytes: 0.6 %
Lymph # K/uL: 0.9 10*3/uL — ABNORMAL LOW (ref 1.3–3.6)
Lymphocyte %: 17 %
MCH: 30 pg/cell (ref 26–32)
MCHC: 34 g/dL (ref 32–37)
MCV: 89 fL (ref 79–92)
Mono # K/uL: 0.5 10*3/uL (ref 0.3–0.8)
Monocyte %: 9.3 %
Neut # K/uL: 3.8 10*3/uL (ref 1.8–5.4)
Nucl RBC # K/uL: 0 10*3/uL (ref 0.0–0.0)
Nucl RBC %: 0 /100 WBC (ref 0.0–0.2)
Platelets: 191 10*3/uL (ref 150–330)
RBC: 4.9 MIL/uL (ref 4.6–6.1)
RDW: 12.7 % (ref 11.6–14.4)
Seg Neut %: 70.2 %
WBC: 5.4 10*3/uL (ref 4.2–9.1)

## 2014-07-27 LAB — MAGNESIUM: Magnesium: 1.7 mEq/L (ref 1.3–2.1)

## 2014-07-27 LAB — MULTIPLE ORDERING DOCS

## 2014-07-27 LAB — CK: CK: 298 U/L — ABNORMAL HIGH (ref 46–171)

## 2014-07-27 LAB — BILIRUBIN, DIRECT: Bilirubin,Direct: 0.2 mg/dL (ref 0.0–0.3)

## 2014-07-31 ENCOUNTER — Ambulatory Visit: Payer: Self-pay | Admitting: Neurology

## 2014-07-31 ENCOUNTER — Encounter: Payer: Self-pay | Admitting: Neurology

## 2014-07-31 VITALS — BP 129/72 | HR 72 | Ht 71.0 in | Wt 178.6 lb

## 2014-07-31 DIAGNOSIS — G35 Multiple sclerosis: Secondary | ICD-10-CM

## 2014-07-31 MED ORDER — BACLOFEN 10 MG PO TABS *I*
ORAL_TABLET | ORAL | Status: DC
Start: 2014-07-31 — End: 2014-09-27

## 2014-07-31 NOTE — Progress Notes (Signed)
Multiple Sclerosis Clinic Follow-up Visit    History of Present Illness:    [name] returns for a follow-up of his Relapsing-remitting multiple sclerosis (RRMS).  He was previously seen by Dr. Ardelia Mems on 07/31/2013.  He presents to today's appointment alone.    [name] denies any new neurologic symptoms suggestive of an exacerbation or relapse.  He feels overall that he is doing fairly well.  He continues on Tecfidera as a DMT.  He is tolerating this and denies missing any medication doses.  He will occasionally experience flushing and redness in his face after taking the medication, but states this only occurs about once a month.  We discussed the use of Aspiring to help with this, but he doesn't feel that it is necessary.  He denies any visual changes, blurriness, or eye pain.  He feels that he occasionally has problems with coordination while ambulating, especially when he is more fatigued.  He denies any recent falls.  He continues to stay physically active playing tennis and pickle ball on a regular bases.  [name] continues to experience cramping in his lower extremities throughout the middle of the night.  He previously tried Baclofen, but found that it made him feel sedated in the morning.  Recently his PCP did lab work and found that his CK was 298.  He instructed [name] to stop his statin medication in hopes that this would help with some of the discomfort.  He hasn't noticed any significant benefit as of yet.  He continues to have mild numbness and tingling in his fingers and toes, but he he tells me that this is tolerable and that he is "used to it".  He denies any trouble with his bowel or bladder.  He continues to use Cialis for ED.  He notes that he has fatigue and feels "sleepy" at about 2-3pm on a daily basis.  He typically gets about 6 hours of sleep nightly, but it is often interrupted by his leg cramps and the television being on.    [name] denies any other new medical concerns at this time.    Multiple  Sclerosis History:   Initial Clinical Presentation: 1990's Right arm weakness   Later Clinical Presentations: numbness in hands and feet   Follow-up MRI:   Brain:         -4/08 relatively mild lesion burden, one enhancing lesion at that time        -5/13 no new lesions        -08/21/2013 no new lesions   Cervical:   Thoracic:   Other Testing:   Disease Modifying Therapy History:   Betaseron started in 1990's, stopped d/t injection site reactions/ infections   Tecfidera started 02/2012 (to present)   Symptomatic Treatment History:   Cialis for ED   Baclofen for leg spasms, although causes sedation    Vital Signs:  Blood pressure 129/72, pulse 72, height 1.803 m (5\' 11" ), weight 81 kg (178 lb 9.2 oz).     Past Medical History:  Past Medical History   Diagnosis Date    Multiple sclerosis     HLD (hyperlipidemia)     Psoriasis     Tic disorder      Past Surgical History   Procedure Laterality Date    Knee arthroscopy Right     Incisional hernia repair      Hx tonsillectomy/adenoidectomy      Vasectomy         Physical Exam:  General Appearance: [name] was well developed and in no acute distress.  Observed mood and affect were appropriate. Attention span was normal.  He was alert and oriented to person, place and time. Short term memory was not impaired.  An adequate fund of knowledge regarding current events was demonstrated.      Language: Normal.     Cranial Nerves:  CN II: No red desaturation noted bilaterally.  Pupils equal in size, round, 4 to 3mm and briskly reactive to light with normal accommodation.  No relative afferent papillary defect noted.  CN III, IV, & VI: Extraocular movements were mildly choppy with non sustained left sided end gaze nystagmus.  No INO noted.  CN V: Normal facial sensation and strength of muscles of mastication.  CN VII: Facial movements are symmetric, no weakness noted.  CN VII: Normal auditory acuity.  CN IX & X: Symmetrical palatal movement.  CN XI:  Sternocleidomastoid and trapezius are normal, no weakness.  No  lhermitte's phenomenon noted.  CN XII: The tongue is midline, no atrophy or fasiculations.     Motor: Normal muscle bulk.  Normal motor tone.  No pronator drift was seen.  No involuntary movements were seen.    Upper Extremities Right Left   Shoulder abduction 5/5 5/5   Biceps 5/5 5/5   Triceps 5/5 5/5   Wrist Extension 5/5 5/5   Finger Abduction 5/5 5/5   Thumb opposition 5/5 5/5   Hand grip 5/5 5/5     Lower Extremities Right Left   Hip flexion 5/5 5/5   Knee extension 5/5 5/5   Knee flexion 5/5 5/5   Ankle plantarflexion 5/5 5/5   Ankle dorsiflexion 5/5 5/5      Sensation: Temperature sensation was intact and symmetric in all distal  extremities.   Rombergs sign was negative, but showed mild swaying.     Muscle Stretch Reflexes:   Reflex: Right Left   Biceps 2+ 2+   Brachioradialis 2+ 2+   Triceps 2+ 2+   Patellar 3+ 3+   Achilles Tendon 2+ 2+    Flexor response noted with plantar stimulation bilaterally.     Coordination/ Cerebellum: Rapid alternating movements normal and accurate with finger tapping.  Finger to nose test showed no dysmetria and heel to shin normal.     Station and Gait: Standard gait was mildly wide based.  Heel walking was normal.  Toe walking was normal.  A tandem gait test was mildly unsteady.     Quantitative Scores:   Timed 25-foot walk: 3.6 seconds Assistive device: none    REVIEW OF IMAGING STUDIES:  MRI Brain 08/21/2013 - I did not personally review the images myself, but I did review the report.  Per the impression: No appreciable change in multiple lesions compatible with M.S.  No evidence of new lesions or enhancing lesions.  These results were discussed with [name] during the office visit.    ASSESSMENT/ PLAN: [name] overall has been doing well in regards to his Relapsing-remitting multiple sclerosis (RRMS).  As mentioned, he continues on Tecfidera.  Other than mild flushing from time to time he seems to be tolerating the  medication well.  He continues to stay physically active, which I commended him on.  In regards to his lower extremity cramping at night, we discussed the possibility of decreasing his Baclofen dose to half a 10mg  pill to see if using a lower dose helps with the morning sedation he was experiencing in the past.  He was willing to try this and a new prescription was sent electronically to his local pharmacy.  Hopefully using the Baclofen will help him with getting more sound sleep at night, which could ultimately help with his daytime fatigue.  We discussed various techniques that may help with his cramping such as staying hydrated and stretching before bed as well.  He finds the Cialis helpful for his ED; therefore, I will have him continue on this.  He doesn't require any other symptomatic medications at this time.    Since his lab work has been stable thus far, I will have him change his every three month CBC with diff and LFT lab work to every 6 months.  A standing order was written for this to be completed.  We will repeat his MRI brain with and without contrast in one year.  An order was written so that he can have a BUN and Crt checked within 45 days prior to this test.    Shamarr will follow-up at the clinic in one year.  He knows to call if he has any questions or concerns in the meantime.      Karmello Abercrombie was seen and discussed with Dr. Ardelia Mems.      Lajean Saver, NP  07/31/2014 9:51 AM            _____________________________________________________________________________________________________________________________________________________  Attending Addendum:      Subjective:  Kaelem Brach is a 63 y.o. M here for a regular follow-up of relapsing remitting MS. Since his 10/14 clinic visit he has not had any new neurologic symptoms.  He continues having intermittent leg spasms at night. Baclofen 10mg  QHS is helpful, but causes sedation the next morning.  He reports fatigue (primarily sleepiness),  particularly mid-afternoon. He sleeps only 6 hrs per night, in part due to leg cramps waking him up, in addition to the TV being on at night.  Hand/foot tingling is ongoing and not painful.  He continues Tecfidera with 1x/month flushing episodes.    MS History:  Initial clinical presentation:1990's R arm weakness  Later clinical presentations:numbness hands and feet  Initial MRI:  Follow-up MRI:4/08 brain-relatively mild lesion burden, 1 enhancing lesion at the time               5/13 brain-no new lesions    11/14 brain-unchanged  Other testing:   10/15 LFT wnl   10/15 abs lymph 0.9  Disease modifying agent treatment ZO:[id number] started in 1990's, stopped due to injection site reactions/infections          Tecfidera started 5/13 and continued through the present  Symptomatic treatment hx:ED-Cialis helps             Foot cramps-baclofen can be helpful    Past Medical History   Diagnosis Date    Multiple sclerosis     HLD (hyperlipidemia)     Psoriasis     Tic disorder      Family History   Problem Relation Age of Onset    Multiple Sclerosis Neg Hx     Lupus Neg Hx     Rheum arthritis Neg Hx     Thyroid disease Neg Hx      SH:   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 Never smoker.         Meds:  Current Outpatient Prescriptions on File Prior to Visit   Medication Sig Dispense Refill    dimethyl  fumarate (TECFIDERA) 240 MG DR capsule Take 1 capsule (240 mg total) by mouth 2 times daily 60 capsule 5    CIALIS 5 MG tablet TAKE 1 TABLET BY MOUTH EVERY DAY 30 tablet 4    montelukast (SINGULAIR) 10 MG tablet       cholecalciferol (VITAMIN D) 1000 UNIT tablet Take 1,000 Units by mouth daily      aqscorbic acid (VITAMIN C) 1000 MG tablet Take 1,000 mg by mouth daily      Cyanocobalamin (VITAMIN B-12 CR PO) Take by mouth      albuterol (PROVENTIL HFA) 108 (90 BASE) MCG/ACT inhaler INHALE 2 PUFFS EVERY 6 HOURS AS NEEDED 7 0     No current facility-administered  medications on file prior to visit.        ROS:  10 point ROS negative other than per HPI    Vitals  Blood pressure 129/72, pulse 72, height 1.803 m (5\' 11" ), weight 81 kg (178 lb 9.2 oz).    PE:  Gen: Well appearing, NAD.  MS: Alert and oriented. Fluent. Affect appropriate for situation.  CN: Pupils 4 to 3mm without APD.  Choppy pursuits.. Full facial strength. Hearing full to soft voice.   Motor: Normal tone. 5/5 strength UE and LE.  DTRs: 2+ biceps, BR, 3+patellars and 2+ankles.   Sens: Moderately decreased vibration in bilateral toes.  Coord: No ataxia on FTN.  Gait: Mildly spastic casual gait.  25 ft walk 3.6 sec without assistive device.    Assesment and Plan:  63 y.o. M with relatively mild relapsing remitting MS that has been stable clinically on Tecfidera as DMT. We will repeat an MRI brain to make sure he has not developed new silent lesions. He understands the importance of Q6 month CBC and LFT monitoring while on Tecfidera. Will continue Cialis and changed baclofen to 5mg  QHS for leg cramps.  Current daytime sleepiness likely due primarily to insufficient quantity of sleep. He will try to adjust sleep hygiene practices such as convincing his wife to keep the TV in the bedroom and also try to take baclofen QHS regularly to decrease leg cramps (in addition to hydration and stretching.)     Continue Tecfidera as DMT   CBC and LFT Q6 month   Cialis and baclofen 5mg  as symptomatic med   Return to clinic in 1 year or sooner if needed      Makaiah Terwilliger, MD

## 2014-07-31 NOTE — Patient Instructions (Addendum)
1.) Continue on Tecfidera as disease modifying therapy  2.) Continue with Cialis as symptomatic medication  3.) CBC and LFT lab work every 6 months (changed from every 3 months)  4.) Try using Baclofen 1/2 pill (5mg ) at night to help with muscle cramps  5.) MRI in 1 year, prior to next apt (BUN and Crt to be completed within 45 days of MRI)  6.) Follow-up in 1 year

## 2014-08-06 ENCOUNTER — Encounter: Payer: Self-pay | Admitting: Neurology

## 2014-08-06 ENCOUNTER — Encounter: Payer: Self-pay | Admitting: Primary Care

## 2014-08-06 NOTE — Telephone Encounter (Signed)
Sent Mychart message to pt to clarify timing of the cramps

## 2014-08-17 ENCOUNTER — Encounter: Payer: Self-pay | Admitting: Primary Care

## 2014-08-22 ENCOUNTER — Other Ambulatory Visit: Payer: Self-pay | Admitting: Primary Care

## 2014-09-04 ENCOUNTER — Ambulatory Visit: Payer: Self-pay | Admitting: Primary Care

## 2014-09-04 ENCOUNTER — Encounter: Payer: Self-pay | Admitting: Primary Care

## 2014-09-04 VITALS — BP 130/78 | HR 60 | Temp 97.8°F | Wt 181.0 lb

## 2014-09-04 DIAGNOSIS — E785 Hyperlipidemia, unspecified: Secondary | ICD-10-CM

## 2014-09-04 DIAGNOSIS — J329 Chronic sinusitis, unspecified: Secondary | ICD-10-CM

## 2014-09-04 DIAGNOSIS — J310 Chronic rhinitis: Secondary | ICD-10-CM

## 2014-09-04 MED ORDER — AMOXICILLIN-POT CLAVULANATE 875-125 MG PO TABS *I*
1.0000 | ORAL_TABLET | Freq: Two times a day (BID) | ORAL | Status: AC
Start: 2014-09-04 — End: 2014-09-14

## 2014-09-04 NOTE — Progress Notes (Signed)
Subjective:     Patient ID: [name] is a 63 y.o. male.    HPI  Nagging sinus pressure, ache, inflammation, week 2, yellow/thick drainage, using neti pot with temporary relief.  Traveling, holiday, wants antibacterial Rx if might help.  No fever, no throat pain or chest congestion.  No known antibiotic intol or allergies.    Has resumed statin every other day along with coenzyme Q10, tolerating for now.    Patient's medications, allergies, past medical, surgical, social and family histories were reviewed and updated as appropriate.  No specific sick exposures, and remains nonsmoker.    Review of Systems  No GI upset.        Objective:   Physical Exam   Constitutional: He appears well-nourished. No distress.   HENT:   Mouth/Throat: Oropharynx is clear and moist.   Eyes: Conjunctivae are normal. Right eye exhibits no discharge. Left eye exhibits no discharge. No scleral icterus.   Neck: Neck supple.   Cardiovascular: Normal rate and regular rhythm.    Pulmonary/Chest: Effort normal. No stridor. No respiratory distress.   Skin: He is not diaphoretic.   Psychiatric: He has a normal mood and affect.     Component      Latest Ref Rng 12/15/2013 01/03/2013   Cholesterol       144 149   Triglycerides       64 79   HDL       57 54   LDL Calculated       74 79   Non HDL Cholesterol       87 95   Chol/HDL Ratio       2.5 2.8           Assessment:      Rhinosinusitis -- offered symptomatic supportive measures alone, prefers Rx.  Offered simpler, narrower spectrum vs Augmentin, prefers latter after discussion.  Warned re possible GI upset, possible effect upon normal flora.    CV -- update lipids on reduced dose statin.    Maint -- discussed Prevnar, colonoscopy.         Plan:      Will add twice daily antibiotic to help clear bacteria from sinuses.  Use sinus/nasal rinses, as you choose.  Push oral hydration, add nasal steroid spray as need to keep passages open.  Be in touch with any Rx intolerance.    Should update  cholesterol profile in DEC.    Would recommend Prevnar (pneumonia) vaccine and updated colonoscopy screening during mid 2016.

## 2014-09-04 NOTE — Patient Instructions (Signed)
Will add twice daily antibiotic to help clear bacteria from sinuses.  Use sinus/nasal rinses, as you choose.  Push oral hydration, add nasal steroid spray as need to keep passages open.  Be in touch with any Rx intolerance.    Should update cholesterol profile in DEC.    Would recommend Prevnar (pneumonia) vaccine and updated colonoscopy screening during mid 2016.

## 2014-09-27 ENCOUNTER — Encounter: Payer: Self-pay | Admitting: Neurology

## 2014-09-27 ENCOUNTER — Ambulatory Visit: Payer: Self-pay | Admitting: Neurology

## 2014-09-27 VITALS — BP 115/60 | HR 58 | Ht 71.0 in | Wt 181.0 lb

## 2014-09-27 DIAGNOSIS — R209 Unspecified disturbances of skin sensation: Secondary | ICD-10-CM

## 2014-09-27 DIAGNOSIS — G35 Multiple sclerosis: Secondary | ICD-10-CM

## 2014-09-27 NOTE — Progress Notes (Signed)
Multiple Sclerosis Clinic Follow-up Visit    Subjective:  [name] is a 63 y.o. M here for a regular follow-up of relapsing remitting MS. Since his 10/15 clinic visit he has developed intermittent R shooting sensation down the who leg, lasting for seconds, occuring up to multiple times per day. There is not associated pain, but it temporarily feels weak. But seconds later it returns to baseline. There is not particular trigger.  He denies back pain. He continues to be able to play tennis and pickle ball. Between episodes it feels at baseline.   Balance is slightly worse than baseline. When he walks, he "meanders." Otherwise there are no new neurologic symptoms.  He denies recent injury or changes in meds other than restarting statin with Co enzyme Q10.    Leg spasms overnight have stopped, so he is not taking baclofen.  He reports fatigue (primarily sleepiness), particularly mid-afternoon, is ongoing in the setting of sleeping 6 hrs on average per night.  Hand/foot tingling is unchanged.    He continues Tecfidera use is ongoing. He has rare flushing.    MS History:  Initial clinical presentation:1990's R arm weakness  Later clinical presentations:numbness hands and feet  Initial MRI:  Follow-up MRI:4/08 brain-relatively mild lesion burden, 1 enhancing lesion at the time               5/13 brain-no new lesions    11/14 brain-unchanged  Other testing:   10/15 LFT wnl   10/15 abs lymph 0.9  Disease modifying agent treatment ZO:XWRUEAVWUhx:Betaseron started in 1990's, stopped due to injection site reactions/infections          Tecfidera started 5/13 and continued through the present  Symptomatic treatment hx:ED-Cialis helps             Foot cramps-baclofen can be helpful    Past Medical History   Diagnosis Date    Multiple sclerosis     HLD (hyperlipidemia)     Psoriasis     Tic disorder      Family History   Problem Relation Age of Onset    Multiple Sclerosis Neg Hx     Lupus Neg Hx     Rheum arthritis Neg Hx      Thyroid disease Neg Hx      SH:   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 Has 2 older children. Never smoker.         Meds:  Current Outpatient Prescriptions on File Prior to Visit   Medication Sig Dispense Refill    CIALIS 5 MG tablet TAKE 1 TABLET BY MOUTH EVERY DAY 30 tablet 4    Coenzyme Q-10 200 MG CAPS Take by mouth      dimethyl fumarate (TECFIDERA) 240 MG DR capsule Take 1 capsule (240 mg total) by mouth 2 times daily 60 capsule 5    cholecalciferol (VITAMIN D) 1000 UNIT tablet Take 1,000 Units by mouth daily      aqscorbic acid (VITAMIN C) 1000 MG tablet Take 1,000 mg by mouth daily      Cyanocobalamin (VITAMIN B-12 CR PO) Take by mouth      fluticasone-salmeterol (ADVAIR DISKUS) 250-50 MCG/DOSE diskus inhaler Inhale 1 puff into the lungs 2 times daily      cetirizine (ZYRTEC) 10 MG tablet Take 10 mg by mouth daily      fluticasone (FLONASE) 50 MCG/ACT nasal spray 1 spray by Nasal route daily      albuterol (  PROVENTIL HFA) 108 (90 BASE) MCG/ACT inhaler INHALE 2 PUFFS EVERY 6 HOURS AS NEEDED 7 0     No current facility-administered medications on file prior to visit.        ROS:  10 point ROS negative other than per HPI    Vitals  Blood pressure 115/60, pulse 58, height 1.803 m (5\' 11" ), weight 82.101 kg (181 lb).    PE:  Gen: Well appearing, NAD.  MS: Alert and oriented. Fluent. Affect appropriate for situation.  CN: Pupils 4 to 24mm without APD.  Choppy pursuits. Full facial strength. Hearing full to soft voice.   Motor: Normal tone.  (R/L) FE 5/5, HF 4/5, ADF 5/5. No pronator drift.  DTRs: 2+ biceps, BR, 3+patellars and 2+ankles.   Sens: Decreased temp in R lateral calf.   Coord: No ataxia on FTN.  Gait: Mildly spastic casual gait.  25 ft walk 3.81 sec without assistive device.    Assesment and Plan:  63 y.o. M with relatively mild relapsing remitting MS on Tecfidera as DMT who has developed hard to categorize brief shooting (non painful) sensations in his R leg,  associated R leg weakness. On exam, he has R hip flexor weakness, which seems new. Given the distribution of numbness in his R calf, there is some suggestion of a lumbar radicular process. Also possible is a stereotyped MS related symptom. He is not currently interested in testing such as thoracic or lumbar MRI. He is also not interested in trying Tegretol to see if this addresses the stereotyped symptom (Tegretol sometimes helpful when these symptoms are MS related.) He will continue monitoring the symptom and will call if it worsens.  Needs MRI brain fall 2016. Will continue Tecfidera with Q6 month CBC and LFT given risk of PML. Per pt's choice, return to clinic in 6 months.    Jefferie Holston, MD

## 2014-09-29 ENCOUNTER — Encounter: Payer: Self-pay | Admitting: Neurology

## 2014-10-09 ENCOUNTER — Encounter: Payer: Self-pay | Admitting: Neurology

## 2014-10-09 DIAGNOSIS — G35 Multiple sclerosis: Secondary | ICD-10-CM

## 2014-10-10 NOTE — Telephone Encounter (Signed)
FYI

## 2014-10-19 NOTE — Telephone Encounter (Addendum)
Pt would like to try tegretol at HS for spasms.    Pt also asking about medical marijuana.  FYI for Dr. Ardelia Mems.

## 2014-10-23 MED ORDER — CARBAMAZEPINE 200 MG PO TABS *I*
200.0000 mg | ORAL_TABLET | Freq: Every evening | ORAL | Status: DC
Start: 2014-10-23 — End: 2015-02-14

## 2014-10-23 NOTE — Addendum Note (Signed)
Addended byGaetana Michaelis on: 10/23/2014 09:31 AM     Modules accepted: Orders

## 2014-10-23 NOTE — Telephone Encounter (Signed)
Sent in script for Tegretol 200mg  QHS. This dose can be adjusted to 100mg  if makes pt too sleepy.

## 2015-01-24 ENCOUNTER — Other Ambulatory Visit: Payer: Self-pay | Admitting: Neurology

## 2015-01-25 ENCOUNTER — Other Ambulatory Visit: Payer: Self-pay | Admitting: Primary Care

## 2015-01-25 NOTE — Telephone Encounter (Signed)
Last HMP 11/24/12  I called and LM, asked Zayd to return call to the office to schedule HMP

## 2015-01-25 NOTE — Telephone Encounter (Signed)
[name] returned call to the office and is willing to schedule.  Call transferred to the front desk to schedule

## 2015-01-26 ENCOUNTER — Other Ambulatory Visit: Payer: Self-pay | Admitting: Primary Care

## 2015-01-29 ENCOUNTER — Telehealth: Payer: Self-pay | Admitting: Neurology

## 2015-01-29 MED ORDER — DIMETHYL FUMARATE 240 MG PO CPDR *I*
240.0000 mg | DELAYED_RELEASE_CAPSULE | Freq: Two times a day (BID) | ORAL | Status: DC
Start: 2015-01-29 — End: 2015-03-25

## 2015-01-29 NOTE — Telephone Encounter (Signed)
Called and left message asking him to get labs as he is on Tecfidera.  Asked him to go to any [facility] lab to get them done or call us if he had questions. Reordered Tecfidera, plus one refill.

## 2015-02-04 ENCOUNTER — Ambulatory Visit
Admit: 2015-02-04 | Discharge: 2015-02-04 | Disposition: A | Discharge status: Home / Self Care | Payer: Self-pay | Source: Ambulatory Visit | Attending: Primary Care | Admitting: Primary Care

## 2015-02-04 DIAGNOSIS — G35 Multiple sclerosis: Secondary | ICD-10-CM

## 2015-02-04 DIAGNOSIS — E785 Hyperlipidemia, unspecified: Secondary | ICD-10-CM

## 2015-02-04 LAB — CBC AND DIFFERENTIAL
Baso # K/uL: 0 10*3/uL (ref 0.0–0.1)
Basophil %: 0.4 %
Eos # K/uL: 0.1 10*3/uL (ref 0.0–0.5)
Eosinophil %: 2.3 %
Hematocrit: 43 % (ref 40–51)
Hemoglobin: 14.7 g/dL (ref 13.7–17.5)
IMM Granulocytes #: 0 10*3/uL (ref 0.0–0.1)
IMM Granulocytes: 0.2 %
Lymph # K/uL: 1.2 10*3/uL — ABNORMAL LOW (ref 1.3–3.6)
Lymphocyte %: 24.2 %
MCH: 31 pg/cell (ref 26–32)
MCHC: 35 g/dL (ref 32–37)
MCV: 89 fL (ref 79–92)
Mono # K/uL: 0.5 10*3/uL (ref 0.3–0.8)
Monocyte %: 10.1 %
Neut # K/uL: 3 10*3/uL (ref 1.8–5.4)
Nucl RBC # K/uL: 0 10*3/uL (ref 0.0–0.0)
Nucl RBC %: 0 /100 WBC (ref 0.0–0.2)
Platelets: 179 10*3/uL (ref 150–330)
RBC: 4.8 MIL/uL (ref 4.6–6.1)
RDW: 12.4 % (ref 11.6–14.4)
Seg Neut %: 62.8 %
WBC: 4.8 10*3/uL (ref 4.2–9.1)

## 2015-02-04 LAB — HEPATIC FUNCTION PANEL
ALT: 34 U/L (ref 0–50)
AST: 36 U/L (ref 0–50)
Albumin: 4.3 g/dL (ref 3.5–5.2)
Alk Phos: 62 U/L (ref 40–130)
Bilirubin,Direct: 0.2 mg/dL (ref 0.0–0.3)
Bilirubin,Total: 0.8 mg/dL (ref 0.0–1.2)
Total Protein: 6.4 g/dL (ref 6.3–7.7)

## 2015-02-04 LAB — LIPID PANEL
Chol/HDL Ratio: 2.3
Cholesterol: 136 mg/dL
HDL: 58 mg/dL
LDL Calculated: 66 mg/dL
Non HDL Cholesterol: 78 mg/dL
Triglycerides: 60 mg/dL

## 2015-02-04 LAB — MULTIPLE ORDERING DOCS

## 2015-02-14 ENCOUNTER — Encounter: Payer: Self-pay | Admitting: Primary Care

## 2015-02-14 ENCOUNTER — Ambulatory Visit: Payer: Self-pay | Admitting: Primary Care

## 2015-02-14 VITALS — BP 104/58 | HR 73 | Resp 12 | Ht 70.5 in | Wt 165.0 lb

## 2015-02-14 DIAGNOSIS — M23306 Other meniscus derangements, unspecified meniscus, right knee: Secondary | ICD-10-CM

## 2015-02-14 DIAGNOSIS — E785 Hyperlipidemia, unspecified: Secondary | ICD-10-CM

## 2015-02-14 DIAGNOSIS — N529 Male erectile dysfunction, unspecified: Secondary | ICD-10-CM

## 2015-02-14 DIAGNOSIS — Z0000 Encounter for general adult medical examination without abnormal findings: Secondary | ICD-10-CM

## 2015-02-14 DIAGNOSIS — I4510 Unspecified right bundle-branch block: Secondary | ICD-10-CM

## 2015-02-14 DIAGNOSIS — G35 Multiple sclerosis: Secondary | ICD-10-CM

## 2015-02-14 DIAGNOSIS — Z1211 Encounter for screening for malignant neoplasm of colon: Secondary | ICD-10-CM

## 2015-02-14 LAB — HM HIV SCREENING OFFERED

## 2015-02-14 NOTE — H&P (Signed)
History and Physical    HISTORY:  Chief Complaint   Patient presents with    Annual Exam         History of Present Illness:    HPI  Approaching 64th BD, here for [place].  Notes:    Some bruising over outer surfaces of extremities, not a lot but perhaps more than used to?  No gingival or nasal mucosal bleeding, no dark stools.    Some GU hesitancy, sense of sluggish finish to voiding but tolerable; no hematuria, no dysuria, no pelvic pain, no constipation.    Some ED despite Cialis 5 mg daily, meds?  Age?    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    Extrinsic allergic asthma J45.909    Chronic cough R05    Acid reflux K21.9    Osteoarthrosis, unspecified whether generalized or localized, lower leg M17.9        Past Medical/Surgical History:   Past Medical History   Diagnosis Date    Multiple sclerosis     HLD (hyperlipidemia)     Psoriasis     Tic disorder      Past Surgical History   Procedure Laterality Date    Knee arthroscopy Right     Incisional hernia repair      Hx tonsillectomy/adenoidectomy      Vasectomy           Allergies:    Allergies   Allergen Reactions    Environmental Allergies     Environmental [Mold]        Current medications:    Current Outpatient Prescriptions   Medication Sig    dimethyl fumarate (TECFIDERA) 240 MG DR capsule Take 1 capsule (240 mg total) by mouth 2 times daily    atorvastatin (LIPITOR) 10 MG tablet TAKE 1 TABLET BY MOUTH DAILY WITH DINNER    CIALIS 5 MG tablet TAKE 1 TABLET BY MOUTH EVERY DAY    fluticasone-salmeterol (ADVAIR DISKUS) 250-50 MCG/DOSE diskus inhaler Inhale 1 puff into the lungs 2 times daily    cetirizine (ZYRTEC) 10 MG tablet Take 10 mg by mouth daily    fluticasone (FLONASE) 50 MCG/ACT nasal  spray 1 spray by Nasal route daily    Coenzyme Q-10 200 MG CAPS Take by mouth    cholecalciferol (VITAMIN D) 1000 UNIT tablet Take 1,000 Units by mouth daily    Cyanocobalamin (VITAMIN B-12 CR PO) Take by mouth    albuterol (PROVENTIL HFA) 108 (90 BASE) MCG/ACT inhaler INHALE 2 PUFFS EVERY 6 HOURS AS NEEDED    aqscorbic acid (VITAMIN C) 1000 MG tablet Take 1,000 mg by mouth daily       Family History:    Family History   Problem Relation Age of Onset    Multiple Sclerosis Neg Hx     Lupus Neg Hx     Rheum arthritis Neg Hx     Thyroid disease Neg Hx     Diabetes Neg Hx     Colon cancer Neg Hx     Colon polyps Neg Hx     High blood pressure Neg Hx     Heart disease Mother      coronary art disease, not premature  Cancer Father      multiple myeloma    Cancer Maternal Grandmother      unknown       Social/Occupational History:   History     Social History    Marital Status: Married     Spouse Name: N/A     Number of Children: N/A    Years of Education: N/A     Social History Main Topics    Smoking status: Never Smoker     Smokeless tobacco: Never Used    Alcohol Use: No      Comment: Very occasionaly    Drug Use: No    Sexual Activity: None     Other Topics Concern    None     Social History Programmer, 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        Exercise -- doubles tennis or pickle ball up to 5 times weekly, joined [employer] and plans to add some resistance machines.        Diet -- varied, more lean proteins, complex carbs, not a lot of dessert, some greens.  Protein shakes at bkfst and lunch, flax, seeds, yogurt.        Sleep -- active dreams, variable sleep success.  No GU interruption.        Safety -- seatbelt and no distractions, smoke detectors, sunglasses, plans to be more consistent with skin block.         Review of Systems:    Review of Systems   Constitutional: Negative for malaise/fatigue and diaphoresis.   HENT: Negative for nosebleeds and sore throat.          No change in headache pattern, none intrusive.  Hearing quite functional.   Eyes: Negative for double vision, photophobia and pain.   Respiratory: Negative for cough, shortness of breath and wheezing.    Cardiovascular: Negative for chest pain, palpitations and leg swelling.   Gastrointestinal: Negative for nausea, vomiting, abdominal pain, diarrhea, constipation, blood in stool and melena.        Heartburn rare, no swallowing difficulties.   Genitourinary: Negative for flank pain.   Musculoskeletal:        No intrusive joint or muscle symptoms.   Skin: Negative for rash.        No active moles noted.   Neurological: Negative for dizziness, tingling and weakness.   Endo/Heme/Allergies: Negative for polydipsia.   Psychiatric/Behavioral: Negative for depression and memory loss. The patient is not nervous/anxious and does not have insomnia.        Vital Signs:   BP 104/58 mmHg   Pulse 73   Resp 12   Ht 1.791 m (5' 10.5")   Wt 74.844 kg (165 lb)   BMI 23.33 kg/m2   SpO2 98%      PHYSICAL EXAM:  Physical Exam   Constitutional: He appears well-developed and well-nourished. No distress.   HENT:   Right Ear: External ear normal.   Left Ear: External ear normal.   Nose: Nose normal.   Mouth/Throat: Oropharynx is clear and moist. No oropharyngeal exudate.   Eyes: Conjunctivae and EOM are normal. No scleral icterus.   Neck: Normal range of motion. Neck supple. No JVD present. No tracheal deviation present. No thyromegaly present.   Cardiovascular: Normal rate, regular rhythm, normal heart sounds and intact distal pulses.  Exam reveals no gallop.    No murmur heard.  Pulmonary/Chest: Effort normal and breath sounds normal. No  stridor. No respiratory distress. He has no wheezes. He has no rales. He exhibits no tenderness.   Abdominal: Soft. Bowel sounds are normal. He exhibits no distension and no mass. There is no hepatosplenomegaly. There is no tenderness.   Musculoskeletal: Normal range of motion. He exhibits no edema or  tenderness.   Lymphadenopathy:     He has no cervical adenopathy.     He has no axillary adenopathy.        Right: No supraclavicular adenopathy present.        Left: No supraclavicular adenopathy present.   Neurological: No cranial nerve deficit. He exhibits normal muscle tone. Coordination normal.   Skin: No rash noted. He is not diaphoretic. No erythema.   No suspicious nevi.   Psychiatric: He has a normal mood and affect. His behavior is normal.     Component      Latest Ref Rng 02/04/2015 07/27/2014 12/15/2013   WBC      4.2 - 9.1 THOU/uL 4.8     RBC      4.6 - 6.1 MIL/uL 4.8     Hemoglobin      13.7 - 17.5 g/dL 14.7     Hematocrit      40 - 51 % 43     MCV      79 - 92 fL 89     MCH      26 - 32 pg/cell 31     MCHC      32 - 37 g/dL 35     RDW      11.6 - 14.4 % 12.4     Platelets      150 - 330 THOU/uL 179     Seg Neut %       62.8     Lymphocyte %       24.2     Monocyte %       10.1     Eosinophil %       2.3     Basophil %       0.4     Neut # K/uL      1.8 - 5.4 THOU/uL 3.0     Lymph # K/uL      1.3 - 3.6 THOU/uL 1.2 (L)     Mono # K/uL      0.3 - 0.8 THOU/uL 0.5     Eos # K/uL      0.0 - 0.5 THOU/uL 0.1     Baso # K/uL      0.0 - 0.1 THOU/uL 0.0     Nucl RBC %      0.0 - 0.2 /100 WBC 0.0     Nucl RBC # K/uL      0.0 - 0.0 THOU/uL 0.0     IMM Granulocytes #      0.0 - 0.1 THOU/uL 0.0     IMM Granulocytes       0.2     Sodium      133 - 145 mmol/L  143    Potassium      3.3 - 5.1 mmol/L  3.9    Chloride      96 - 108 mmol/L  101    CO2      20 - 28 mmol/L  29 (H)    Anion Gap      7 - 16  13    UN      6 - 20 mg/dL  13    Creatinine      0.67 - 1.17 mg/dL  1.05    GFR,Caucasian        74    GFR,Black        86    Glucose      60 - 99 mg/dL  130 (H)    Calcium      8.6 - 10.2 mg/dL  8.9    Total Protein      6.3 - 7.7 g/dL 6.4 6.6    Albumin      3.5 - 5.2 g/dL 4.3 4.4    Bilirubin,Total      0.0 - 1.2 mg/dL 0.8 0.7    AST      0 - 50 U/L 36 39    ALT      0 - 50 U/L 34 39    Alk Phos      40 - 130 U/L 62 74     Bilirubin,Direct      0.0 - 0.3 mg/dL 0.2 0.2    Cholesterol       136  144   Triglycerides       60  64   HDL       58  57   LDL Calculated       66  74   Non HDL Cholesterol       78  87   Chol/HDL Ratio       2.3  2.5   Hep C Ab         NEG   Magnesium      1.3 - 2.1 mEq/L  1.7    CK      46 - 171 U/L  298 (H)          Assessment:    CNS -- well compensated, closely monitored.    GU -- declines alpha blocker or finasteride; declines prostate cancer screening after discussion.  Okay to boost Cialis dose prior to sex.    Bruising sounds innocent; declines further w/u after discussion, but will report with increased bruising/bleeding sx.    CV -- reviewed ten year risk with ACC tool, and he is optimal for age, around 8%.  Thus statin and low dose ASA are optional at this point.  To emphasize heart healthy living and report changes in exercise tol.  LVH, thoracic aorta, RBBB being followed.    Discussed colon screening, immunization schedule, adv directive issue.   .      Plan:        Colonoscopy with Maryann Alar at [facility], due in July    Prevnar vaccine due promptly after 64th birthday  Pneumovax vaccine booster due 6 to 12 months following the Prevnar    Given 8% ten year prediction of CV risk off statin, would stop the statin and the coQ10 for now.  If/when you have been on a stable diet and exercise regimen that you plan to continue for at least three months, would be helpful to see what the fasting lipid profile looks like, at least 30 days off statin.    Okay to increase Cialis dose to 15 mg before anticipated sexual activity.

## 2015-02-14 NOTE — Patient Instructions (Addendum)
Colonoscopy with Lisbeth Ply at [facility], due in July    Prevnar vaccine due promptly after 64th birthday  Pneumovax vaccine booster due 6 to 12 months following the Prevnar    Given 8% ten year prediction of CV risk off statin, would stop the statin and the coQ10 for now.  If/when you have been on a stable diet and exercise regimen that you plan to continue for at least three months, would be helpful to see what the fasting lipid profile looks like, at least 30 days off statin.    Okay to increase Cialis dose to 15 mg before anticipated sexual activity.

## 2015-02-18 ENCOUNTER — Encounter: Payer: Self-pay | Admitting: Primary Care

## 2015-02-27 ENCOUNTER — Telehealth: Payer: Self-pay

## 2015-02-27 NOTE — Telephone Encounter (Signed)
Called the patient to confirm 5/19 appointment, spoke to the patient and he confirmed.

## 2015-02-28 ENCOUNTER — Ambulatory Visit: Payer: Self-pay

## 2015-02-28 DIAGNOSIS — Z23 Encounter for immunization: Secondary | ICD-10-CM

## 2015-03-19 ENCOUNTER — Ambulatory Visit
Admit: 2015-03-19 | Discharge: 2015-03-19 | Disposition: A | Discharge status: Home / Self Care | Payer: Self-pay | Source: Ambulatory Visit | Attending: Oncology | Admitting: Oncology

## 2015-03-19 DIAGNOSIS — E785 Hyperlipidemia, unspecified: Secondary | ICD-10-CM

## 2015-03-19 LAB — LIPID PANEL
Chol/HDL Ratio: 3.2
Cholesterol: 185 mg/dL
HDL: 57 mg/dL
LDL Calculated: 118 mg/dL
Non HDL Cholesterol: 128 mg/dL
Triglycerides: 48 mg/dL

## 2015-03-19 LAB — GLUCOSE: Glucose: 87 mg/dL (ref 60–99)

## 2015-03-20 ENCOUNTER — Encounter: Payer: Self-pay | Admitting: Primary Care

## 2015-03-25 ENCOUNTER — Other Ambulatory Visit: Payer: Self-pay | Admitting: Oncology

## 2015-03-25 MED ORDER — DIMETHYL FUMARATE 240 MG PO CPDR *I*
240.0000 mg | DELAYED_RELEASE_CAPSULE | Freq: Two times a day (BID) | ORAL | 1 refills | Status: DC
Start: 2015-03-25 — End: 2015-06-10

## 2015-04-30 ENCOUNTER — Other Ambulatory Visit: Payer: Self-pay | Admitting: Gastroenterology

## 2015-04-30 LAB — HM COLONOSCOPY

## 2015-04-30 NOTE — Procedures (Signed)
[facility]  Colonoscopy Procedure Note     Date of Procedure: 04/30/2015   Referring Physician: Ruby Cola, MD   Primary Physician: Ruby Cola, MD  Attending Physician: Cline Cools MD  Indications:   Colorectal cancer screening-average risk    Pre-Endoscopic evaluation: The indications, risks, benefits, of the planned procedure and moderate sedation have been explained to the patient and/or family and for/or proxy.  I have reviewed the patient's database and reassessed the patient immediately prior to the procedure.  No changes were noted from the earlier pre-operative evaluation.  The patient was deemed appropriate and stable to undergo the planned procedure.  Vital Signs Stable per Nursing notes  Airway Normal - Heart Sounds normal- Lungs Clear  ASA Class 2    Medications:Fentanyl 50 mcg IV and Midazolam 5 mg IV were administered incrementally over the course of the procedure to achieve an adequate level of conscious sedation.     Procedure Details: The patient was placed in the left lateral decubitus position and monitored continuously with ECG tracing, pulse oximetry, blood pressure monitoring and direct observations. After anorectal examination was performed, the Olympus Video Colonoscope was inserted into the rectum and advanced under direct vision to the cecum, which was identified by  the ileocecal valve and the appendiceal orifice. The procedure was considered not difficult..    Narrative:  During withdrawal examination, the final quality of the prep was good  A careful inspection was made as the colonoscope was withdrawn, a retroflexed view of the rectum was included; findings and interventions are described below.The patient recovered satisfactorily  in the [facility] recovery area.      The cecum, ascending colon, transverse colon, descending colon, rectosigmoid revealed no evidence of mass, polyp or inflammatory disease., Moderate Diverticulosis seen in the sigmoid colon. and Mild  diffuse Diverticulosis noted throughout colon.    Additional Interventions:   None    Complications: none    Impression:   Diverticulosis, moderate  Internal hemorrhoids    Recommendations:   Recommend stool FIT in 3 years, if negative repeat colonoscopy in 10 years    Lisbeth Ply, MD

## 2015-05-07 ENCOUNTER — Telehealth: Payer: Self-pay | Admitting: Primary Care

## 2015-05-07 MED ORDER — SILDENAFIL CITRATE 100 MG PO TABS *I*
ORAL_TABLET | ORAL | 2 refills | Status: DC
Start: 2015-05-07 — End: 2015-11-24

## 2015-05-07 NOTE — Telephone Encounter (Signed)
Oda returned call to the office and I asked if a trial of Viagra has ever been completed with him.  Uzziah states he has not used Viagra in the past. He states he is using the cialis for ED and that he is currently taking a daily dose.  I explained that he could continue to use the Cialis daily, but the insurance would not cover the medication at this time.  Explained that they are requiring a trial of Viagra on an as needed dose.   Efrem is agreeable to the trial and the orders are entered.  Please review  Brexten was advise to return call to the office with any problems or questions with the medication change

## 2015-05-14 ENCOUNTER — Encounter: Payer: Self-pay | Admitting: Gastroenterology

## 2015-05-27 ENCOUNTER — Encounter: Payer: Self-pay | Admitting: Neurology

## 2015-05-28 ENCOUNTER — Other Ambulatory Visit: Payer: Self-pay | Admitting: Neurology

## 2015-05-28 MED ORDER — BACLOFEN 10 MG PO TABS *I*
ORAL_TABLET | ORAL | 1 refills | Status: DC
Start: 2015-05-28 — End: 2015-09-19

## 2015-05-28 NOTE — Telephone Encounter (Signed)
Pt calling to speak with someone as soon as possible.  He would like to speak with an NP.

## 2015-05-28 NOTE — Telephone Encounter (Addendum)
Spoke to pt who has been playing tennis and noticed an increase in spasms yesterday, and the past few games.  Spasms have become more frequent but not painful.  They do cause more imbalance which is frustrating when he is playing tennis.    Symptoms are not new, or more severe than in the past.  He has been on baclofen previously but cannot remember if it was helpful, or sedating.    Pt denies recent illness, symptoms of UTI or changes in vision.  He has been outside for extended periods of time but does not notice an improvement in spasms with rest/cooling.    Writer discussed that extended time in the heat/sun while playing tennis or golf may be causing an increase in symptoms.  Pt is willing to try baclofen again, and understands it may be sedating.      He is aware initial dosing would being at [facility], and he could take another dose 30-60 prior to a game to help reduce spasms.  Pt inquired what dosing would be.  Writer advised 5-10 mg but would defer to NP if she agreed with plan.    Confirmed [facility] on [address] as preferred pharmacy.    Writer to update pt via My Chart.  Pt is aware.

## 2015-06-06 ENCOUNTER — Ambulatory Visit: Payer: Self-pay | Admitting: Neurology

## 2015-06-06 ENCOUNTER — Encounter: Payer: Self-pay | Admitting: Neurology

## 2015-06-06 VITALS — BP 118/77 | HR 51 | Ht 70.5 in | Wt 165.0 lb

## 2015-06-06 DIAGNOSIS — G35 Multiple sclerosis: Secondary | ICD-10-CM

## 2015-06-06 MED ORDER — CARBAMAZEPINE 100 MG PO TB12 *I*
ORAL_TABLET | ORAL | 11 refills | Status: DC
Start: 2015-06-06 — End: 2015-09-19

## 2015-06-06 NOTE — Progress Notes (Signed)
Multiple Sclerosis Clinic Follow-up Visit    Subjective:  [name] is a 64 y.o. M here for a regular follow-up of relapsing remitting MS. Since his 12/15 clinic visit he has been experiencing increased frequency of R thigh "spasms" that last for a few seconds at a time and affect the area just proximal to his knee. It is hard to describe this symptom, which may be more of an abnormal sensation than a true spasm. They occur up to 50x/day. While they are not painful, he is very bothered by them, for example when playing they distract him from a shot.Tegretol was prescribed, but it is uncertain if this tried or if it was effective/tolerable. He wonders if medical marijuana would help, since it may help with traditional spasms. He has tried baclofen  QHS, not during the day, although "spasms"  do not occur during the night.    Fatigue (primarily sleepiness) is ongoing in the setting sleeping 6 hrs/night.  Hand/foot tingling is unchanged.    Tecfidera is ongoing without regular missed doses and only rare flushing episodes.    MS History:  Clinical presentations:   1990's R arm weakness   Later numbness hands and feet and stereotyped abnormal distal R thigh sensation  MRIs:   4/08 brain-relatively mild lesion burden, 1 enhancing lesion at the time   5/13 brain-no new lesions   11/14 brain-unchanged  Other testing:   4/16LFT wnl   4/16 abs lymph 1.2  Disease modifying treatment hx:   Betaseron started in 1990's, stopped due to injection site reactions/infections   Tecfidera started 5/13 and continued through the present  Symptomatic treatment hx:   ED-Cialis helps   Foot cramps-baclofen can be helpful   Stereotyped R thigh abnormal sensation-will try carbamazepine    Past Medical History   Diagnosis Date    HLD (hyperlipidemia)     Multiple sclerosis     Psoriasis     Tic disorder      Family History   Problem Relation Age of Onset    Multiple Sclerosis Neg Hx     Lupus Neg Hx     Rheum arthritis  Neg Hx     Thyroid disease Neg Hx      SH:   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 Never smoker.       Meds:  Current Outpatient Prescriptions on File Prior to Visit   Medication Sig Dispense Refill    baclofen (LIORESAL) 10 MG tablet One tablet each night before bedtime.  Additional half tablet as needed for spasms. 45 tablet 1    sildenafil (VIAGRA) 100 MG tablet Take 1/2 to 1 tablet by mouth, Take at least 30 minutes prior to sexual activity. 8 tablet 2    dimethyl fumarate (TECFIDERA) 240 mg DR capsule Take 1 capsule (240 mg total) by mouth 2 times daily 60 capsule 1    fluticasone-salmeterol (ADVAIR DISKUS) 250-50 MCG/DOSE diskus inhaler Inhale 1 puff into the lungs 2 times daily      cetirizine (ZYRTEC) 10 MG tablet Take 10 mg by mouth daily      fluticasone (FLONASE) 50 MCG/ACT nasal spray 1 spray by Nasal route daily      cholecalciferol (VITAMIN D) 1000 UNIT tablet Take 1,000 Units by mouth daily      aqscorbic acid (VITAMIN C) 1000 MG tablet Take 1,000 mg by mouth daily      Cyanocobalamin (VITAMIN B-12 CR  PO) Take by mouth      albuterol (PROVENTIL HFA) 108 (90 BASE) MCG/ACT inhaler INHALE 2 PUFFS EVERY 6 HOURS AS NEEDED 7 0     No current facility-administered medications on file prior to visit.         ROS:  10 point ROS negative other than per HPI    Vitals  Blood pressure 118/77, pulse 51, height 1.791 m (5' 10.5"), weight 74.8 kg (165 lb).    PE:  Gen: Well appearing, NAD.  MS: Alert and oriented. Fluent. Affect appropriate for situation.  CN: Pupils 4 to 3mm without APD.  Choppy pursuits. Full facial strength. Hearing full to soft voice.   Motor: Normal tone.  (R/L) FE 5/5, HF 5/5-, ADF 5/5. No pronator drift.  DTRs: 2+ biceps, BR, 3+patellars.   Sens: Temp full in extremities.  Coord: No ataxia on FTN.  Gait: Mildly wide-based casual gait.  25 ft walk 3.31 sec without assistive device.    Assesment and Plan:  64 y.o. M with relatively  mild relapsing remitting MS on Tecfidera as DMT who has not had recent clinical or radiologic breakthough disease. He has only very mild lymphopenia and tolerates this med, so it will be continued with Q6 month CBC and LFT. MRI brain scheduled 10/16.  He experiences non-painful, but bothersome stereotyped abnormal sensation in distal R thigh that lasts for seconds, but occurs many times per day. We discussed that for these stereotyped symptoms in MS, carbamazepine is often the most effective tx. He is uncertain if he tried the script prescribed at last visit. For now, he will try baclofen 5mg  QAM to see if there is any benefit. If not, he will stop baclofen. He will then try Tegretol 100mg  QHS x1 week, then 200mg  QHS. If more benefit is needed, can add 100mg  QAM dose.  If not succesful, could try medical marijuana (unclear if would have any impact on pt's thoracic aortic aneurysm given lack of literature, although since it might induce hypotension instead of hypertension, less likely to have a negative impact.) I am not certain that medical marijuana would help since its benefit for typical spasms might not occur since his symptom is not a typical spasm. Can discuss by phone if any symptomatic med adjustments are needed.  Return to clinic in 3-6 months.    Kauri Garson, MD  Neuroimmunology

## 2015-06-06 NOTE — Patient Instructions (Signed)
·   Please try 5mg  (1/2 pill) of baclofen during the day to see if this lessens spasm frequency.   If the baclofen during the day is not effective, please stop all baclofen (since you are not noticing any benefit or spasms overnight.)   If baclofen is not effective, we will then try Tegretol instead.You will start with 100mg  Tegretol at bedtime for 1 week, then increase to 200mg  at bedtime and continue. After 2 weeks on this higher dose, please let us know if there is benefit or side effects.

## 2015-06-09 ENCOUNTER — Encounter: Payer: Self-pay | Admitting: Neurology

## 2015-06-10 ENCOUNTER — Other Ambulatory Visit: Payer: Self-pay | Admitting: Neurology

## 2015-06-10 MED ORDER — DIMETHYL FUMARATE 240 MG PO CPDR *I*
240.0000 mg | DELAYED_RELEASE_CAPSULE | Freq: Two times a day (BID) | ORAL | 1 refills | Status: DC
Start: 2015-06-10 — End: 2015-08-15

## 2015-06-10 NOTE — Telephone Encounter (Signed)
[facility] will like a verbal order please for the Rx dimethyl fumarate (TECFIDERA) 240 mg DR capsule  Phone [phone]

## 2015-06-30 ENCOUNTER — Encounter: Payer: Self-pay | Admitting: Neurology

## 2015-07-02 ENCOUNTER — Other Ambulatory Visit: Payer: Self-pay | Admitting: Neurology

## 2015-07-02 DIAGNOSIS — G35 Multiple sclerosis: Secondary | ICD-10-CM

## 2015-07-02 NOTE — Telephone Encounter (Signed)
Pt has been on tegretol for spasms without relief.  PT was offered.  He would like to know if there are other medications.

## 2015-07-05 ENCOUNTER — Encounter: Payer: Self-pay | Admitting: Neurology

## 2015-07-08 ENCOUNTER — Ambulatory Visit
Admission: RE | Admit: 2015-07-08 | Discharge: 2015-07-08 | Disposition: A | Discharge status: Home / Self Care | Payer: Self-pay | Source: Ambulatory Visit

## 2015-07-08 ENCOUNTER — Ambulatory Visit: Payer: Self-pay | Admitting: Rehabilitative and Restorative Service Providers"

## 2015-07-08 DIAGNOSIS — G35 Multiple sclerosis: Secondary | ICD-10-CM

## 2015-07-08 LAB — COMPREHENSIVE METABOLIC PANEL
ALT: 56 U/L — ABNORMAL HIGH (ref 0–50)
AST: 47 U/L (ref 0–50)
Albumin: 4.4 g/dL (ref 3.5–5.2)
Alk Phos: 109 U/L (ref 40–130)
Anion Gap: 16 (ref 7–16)
Bilirubin,Total: 0.6 mg/dL (ref 0.0–1.2)
CO2: 26 mmol/L (ref 20–28)
Calcium: 8.5 mg/dL — ABNORMAL LOW (ref 8.6–10.2)
Chloride: 99 mmol/L (ref 96–108)
Creatinine: 0.87 mg/dL (ref 0.67–1.17)
GFR,Black: 104 *
GFR,Caucasian: 90 *
Glucose: 72 mg/dL (ref 60–99)
Lab: 17 mg/dL (ref 6–20)
Potassium: 4.4 mmol/L (ref 3.3–5.1)
Sodium: 141 mmol/L (ref 133–145)
Total Protein: 6.8 g/dL (ref 6.3–7.7)

## 2015-07-08 LAB — CBC AND DIFFERENTIAL
Baso # K/uL: 0.1 10*3/uL (ref 0.0–0.1)
Basophil %: 1.2 %
Eos # K/uL: 0.2 10*3/uL (ref 0.0–0.5)
Eosinophil %: 3.2 %
Hematocrit: 42 % (ref 40–51)
Hemoglobin: 14.7 g/dL (ref 13.7–17.5)
IMM Granulocytes #: 0 10*3/uL (ref 0.0–0.1)
IMM Granulocytes: 0.5 %
Lymph # K/uL: 0.9 10*3/uL — ABNORMAL LOW (ref 1.3–3.6)
Lymphocyte %: 14.1 %
MCH: 32 pg/cell (ref 26–32)
MCHC: 35 g/dL (ref 32–37)
MCV: 91 fL (ref 79–92)
Mono # K/uL: 0.8 10*3/uL (ref 0.3–0.8)
Monocyte %: 13.8 %
Neut # K/uL: 4.1 10*3/uL (ref 1.8–5.4)
Nucl RBC # K/uL: 0 10*3/uL (ref 0.0–0.0)
Nucl RBC %: 0 /100 WBC (ref 0.0–0.2)
Platelets: 168 10*3/uL (ref 150–330)
RBC: 4.6 MIL/uL (ref 4.6–6.1)
RDW: 12.5 % (ref 11.6–14.4)
Seg Neut %: 67.2 %
WBC: 6 10*3/uL (ref 4.2–9.1)

## 2015-07-08 NOTE — Progress Notes (Signed)
Physical Therapy Daily Flowsheet:  *Please see Physical Therapy Exercise Flowsheet for details regarding exercises completed this session.*     07/08/15 1530   Overview   Diagnosis MS   Insurance Medicare Blue Choice   Visit # 1   Functional Outcome Measures   Sitting to Standing 4   Standing Unsupported 4   Sitting with back unsupported but feet supported on floor or on stool 4   Standing to Sitting 4   Transfers 4   Standing Unsupported with Eyes Closed 4   Standing Unsupported with Feet Together 4   Reaching Forward with Outstretched Arm While Standing 3   Pick Up Object From Floor From A Standing Position 4   Turning to Look Behind Over Left or Right Shoulder While Standing 4   Turn 360 Degrees 4   Placing Alternate Feet on Step or Stool While Standing Unsupported 4   Standing Unsupported One Foot in Front 4   Stand on One Leg 4   Berg Score (Max of 56) 55   Time Calculation   PT Untimed Codes 56   PT Unbilled Time 0   PT Total Treatment 56   Charges   Visit Charges Outpatient Pt Eval - code 3303 (45 minutes);Therapeutic Exercises - code 0204 (15 minutes x2)   Medicare Functional Reporting   Goal Status Mobility Walking Moving Around /  Mobility GOAL status  [phone]   Medicare Functional Limit Modifiers CI   1 -  < 20% impaired, limited restricted   Current Status Mobility Walking Moving Around / CURRENT status [phone]   Medicare Functional Limit Modifiers CI   1 -  < 20% impaired, limited restricted   Discharge Status Mobility Walking Moving Around / Discharge status  [phone]   Medicare Functional Limit Modifiers CI   1 -  < 20% impaired, limited restricted   Iva Lento, PT

## 2015-07-08 NOTE — Telephone Encounter (Signed)
Pt has tried baclofen for spasms, but it was not helpful.  He is now on tegretol 100/200, but is too "dopey".  Please advise.

## 2015-07-11 ENCOUNTER — Encounter: Payer: Self-pay | Admitting: Neurology

## 2015-07-11 NOTE — Telephone Encounter (Signed)
I attempted to call the patient to discuss different medications he has tried. He was not available and I left a voice message.

## 2015-07-12 NOTE — Telephone Encounter (Signed)
I spoke to patient. He is still having constant spasms after trying baclofen, tegretol and accupuncture. He gets very sedated on these medications and they have not been very effective. He has not tried tizanidine, I offered to order this for a trial but he is not interested. He would like to try something 'outside the box'. Explained that medical marijuana is not covered by insurance and he would need to go through a process of certification, it also has side effects.   He will like to discuss the certification process with Dr Ardelia Mems, this is the only thing he is willing to try now.

## 2015-07-15 ENCOUNTER — Encounter: Payer: Self-pay | Admitting: Gastroenterology

## 2015-07-15 ENCOUNTER — Telehealth: Payer: Self-pay | Admitting: Neurology

## 2015-07-15 NOTE — Telephone Encounter (Signed)
Spoke with pt who describes frequent episodes of abnormal sensation in his leg. This has no pain and no muscle contraction associated. We tried carbamazepine 200mg  QHS (daytime 100mg  QAM too sedating), but he notes no benefit. He is very interested in medical marijuana. He is aware of lack of large body of literature about this, also is aware of potential side effects as noted in prior office note. Will certify him for low THC/high CBD oral form and mail form to pt for him to do next steps in certification. While he describes these episodes as spasms, they are somewhat atypical for spasms, so it is not certain if this treatment will be helpful or not.

## 2015-07-15 NOTE — Progress Notes (Signed)
Physical Therapy Exercise Flowsheet:  *Please refer to Physical Therapy Daily Flowsheet for further details of this session.*     07/08/15 1530   Knee Exercises   additional exercise hamstring stretching program for HEP and for warm up prior to tennis   additional exercise quad stretching program for HEP and for warm up prior to tennis   Total time 24   Iva Lento, PT

## 2015-07-15 NOTE — Progress Notes (Signed)
Department of Physical Medicine & Rehabilitation  Physical Therapy Initial Assessment    HISTORY  Patient's Medical Diagnosis:  Diagnosis: MS    Past Medical History   Diagnosis Date    HLD (hyperlipidemia)     Multiple sclerosis     Psoriasis     Tic disorder      Past Surgical History   Procedure Laterality Date    Knee arthroscopy Right     Incisional hernia repair      Hx tonsillectomy/adenoidectomy      Vasectomy       Referring practitioner: Gaetana Michaelis, MD  Reason for referral: exercise program and help reduce spasms  Onset date on symptoms/Date of Surgery: Multiple Sclerosis x 30 years  Previous treatments: none  Previous Functional Level: independent  Type of home: 2 Story   Lives: with spouse   Entrance to home: 4 stairs with 1  rail(s)   Stairs inside of home: 12 stairs with 1  rail(s)   Bedroom location: 2nd floor   Bathroom location: half bath on 1st floor and full bath on 2nd floor   Medical equipment in the home: none  Recreational activities: Tennis    SUBJECTIVE  Pain:  0-10 Scale: 3     Pain Location: Leg  Pain Orientation: Right     Pain Descriptors: Spasm    Current functional limitations:spasms  Patient Goals for Therapy: pt would like to learn how to manage spasms better    OBJECTIVE  Observation: Patient is a pleasant and cooperative male in NAD.   Cognition: no deficit noted  Vision: no deficit noted  Sensation: intact  Coordination: intact  Tone: right lower extremity:  within normal limits  left lower extremity:  within normal limits      ROM:       R UE: AROM within functional limits   L UE: AROM within functional limits   R LE: AROM within functional limits   L LE: AROM within functional limits    *Indicates pain    Strength:   STRENGTH  Right Lower Extremity: RLE Strength WFLs & able to perform ADL Tasks  Left Lower Extremity: LLE Strength WFLs & able to perform ADL tasks      Functional assessment:   Functional Activities  Additional Comments: pt ambulated with no device  greater than 300 feet with no deviations noted at this time.  pt ambulated on uneven surfaces with no device with no overt losses of balance.  pt negotiated 12 stairs with no rail with reciprocal pattern with no assistance.  pt reports that he plays tennis daily. pt reports his issue at this time are the intermittant spasms that he experiences in right LE.  pt can not pinpoint a pattern or a trigger to the spasms.  pt also reports that you can not visably see the muscle twitching.       Balance:   Static sitting: Independent, unsupported   Dynamic sitting: Independent, unsupported   Static standing: Independent, unsupported   Dynamic standing: Independent, unsupported    Functional Performance Outcome Measures:   Berg Score (Max of 56): 55        Endurance: good     Patient Education:   Patient and/or family educated in PT plan of care and fall prevention strategies.         ASSESSMENT  [name] is a 64 y.o. y.o. male with a history of Multiple Sclerosis . He currently presents to outpatient physical therapy with  deficits in LE with spasms resulting in difficulty in tennis playing. He would benefit from skilled PT to maximize independence with functional mobility.     Rehab potential/prognosis: good  Patient's understanding: good      PLAN  Plan of Care: Discharge PT, all goals achieved and patient independent with a home exercise program  PT interventions: AROM/PROM/Therapeutic exercise, Balance activites, Closed chain activites, Flexibility, Gait training/Functional activities, General conditioning, Home exercise program instruction, Manual therapy, Neuromuscular Re-education, Patient/Family Education, Postural training/body Curator education, Proprioceptive training, Strengthening, Therapeutic Activities,   PT frequency:  One time visit  PT duration: 1 visit    Short term goals: 1 visit  1. Pt to be independent in HEP Achieved  Long Term Goals: 1 visit   See above    Goal Status: Mobility Walking Moving  Around /  Mobility GOAL status  [phone]  Medicare Functional Limit Modifiers: CI   1 -  < 20% impaired, limited restricted          Current Status: Mobility Walking Moving Around / CURRENT status [id number]  Medicare Functional Limit Modifiers: CI   1 -  < 20% impaired, limited restricted          Discharge Status: Mobility Walking Moving Around / Discharge status  [phone]  Medicare Functional Limit Modifiers: CI   1 -  < 20% impaired, limited restricted        *Based on clinical judgement, objective measures and subjective reports      Thank you for the referral.  If you have any questions and/or concerns, please feel free to contact me at [phone].    Iva Lento, PT                Department of Physical Medicine & Rehabilitation    PLAN OF CARE    Physician:  Gaetana Michaelis, MD    I have reviewed your initial evaluation and agree with the documented goals and Plan of Care

## 2015-07-16 ENCOUNTER — Ambulatory Visit
Admission: RE | Admit: 2015-07-16 | Discharge: 2015-07-16 | Disposition: A | Discharge status: Home / Self Care | Payer: Self-pay | Source: Ambulatory Visit | Attending: Neurology | Admitting: Neurology

## 2015-07-16 MED ORDER — GADOBUTROL 1 MMOL/ML (GADAVIST) IV SOLN *WRAPPED*
9.0000 mL | Freq: Once | INTRAVENOUS | Status: AC
Start: 2015-07-16 — End: 2015-07-16
  Administered 2015-07-16: 9 mL via INTRAVENOUS

## 2015-07-16 NOTE — Telephone Encounter (Signed)
Pt talked with Dr. Ardelia Mems yesterday regarding medical marijuana

## 2015-07-23 ENCOUNTER — Encounter: Payer: Self-pay | Admitting: Neurology

## 2015-07-26 ENCOUNTER — Encounter: Payer: Self-pay | Admitting: Neurology

## 2015-07-28 ENCOUNTER — Encounter: Payer: Self-pay | Admitting: Neurology

## 2015-08-15 ENCOUNTER — Encounter: Payer: Self-pay | Admitting: Neurology

## 2015-08-19 MED ORDER — DIMETHYL FUMARATE 240 MG PO CPDR *I*
240.0000 mg | DELAYED_RELEASE_CAPSULE | Freq: Two times a day (BID) | ORAL | 5 refills | Status: DC
Start: 2015-08-19 — End: 2016-01-24

## 2015-09-12 ENCOUNTER — Encounter: Payer: Self-pay | Admitting: Neurology

## 2015-09-12 NOTE — Telephone Encounter (Signed)
Pt reports medical marijuana is not effective for spasms and is asking for tizanidine offered by Dr. Jerline Pain in 07/11/2015 My Chart message.  Please advise.

## 2015-09-16 ENCOUNTER — Encounter: Payer: Self-pay | Admitting: Neurology

## 2015-09-17 NOTE — Telephone Encounter (Signed)
Pt sent another message requesting a response.

## 2015-09-19 ENCOUNTER — Other Ambulatory Visit: Payer: Self-pay | Admitting: Neurology

## 2015-09-19 ENCOUNTER — Telehealth: Payer: Self-pay | Admitting: Neurology

## 2015-09-19 MED ORDER — TIZANIDINE HCL 2 MG PO CAPS *A*
ORAL_CAPSULE | ORAL | 3 refills | Status: DC
Start: 2015-09-19 — End: 2016-01-24

## 2015-09-19 NOTE — Telephone Encounter (Signed)
Patient calling (upset) no one has got back to him in regards to the My chart messages he sent. Stated medical marijuana is not effective for spasms and is asking for Rx tizanidine offered by Dr. Arley Phenix. Please advise.

## 2015-09-19 NOTE — Telephone Encounter (Signed)
It is fine to try the Tizanidine, but I am not very hopeful that it will be effective because the symptom he calls a spasm seems to actually be a brief abnormal sensation. I unfortunately do not have a good alternative to offer. Thanks

## 2015-09-19 NOTE — Telephone Encounter (Signed)
Talked with [name].  He said that the meds we have given him haven't been helpful for spasms in his R leg.  He has tried baclofen, tegretol, medical marijuana, and was wondering if he could try the tizanidine recommended by Dr. Jerline Pain.  He said there is no pain.  He describes it as a spasms, or muscle twitching around his R knee.  He denies redness or swelling.  Told him that I would be happy to write the prescription, but that I was unsure if it would work as other muscle relaxants haven't been helpful.  Will write for tizanidine 2 mg TID.  Described side effects of tizanidine (including sleepiness).  Recommended that he start with one at bedtime, but could increased to 3x a day in a few days if tolerating without sleepiness.  Will check with Dr. Ardelia Mems to see if she would recommend [place] or to have him come to office for follow up at this time.

## 2015-10-23 ENCOUNTER — Encounter: Payer: Self-pay | Admitting: Primary Care

## 2015-10-23 NOTE — Telephone Encounter (Signed)
I spoke with [name] he states it isn't belching it is passing gas only.  It seems to be isolated to later in the afternoon and evening.  He states his bowels are moving well.  He is currently in [place] and returns on Tuesday 1/17.

## 2015-11-13 ENCOUNTER — Encounter: Payer: Self-pay | Admitting: Neurology

## 2015-11-14 NOTE — Telephone Encounter (Signed)
When comparing interactions of tizanidine with pt's other meds it has three class C interactions including increased effects of CNS depressants, blood pressure lowering agents and sympathomimetics) as well as two class B warnings for QTc prolongation.    Please advise

## 2015-11-24 ENCOUNTER — Other Ambulatory Visit: Payer: Self-pay | Admitting: Primary Care

## 2015-11-25 NOTE — Telephone Encounter (Signed)
Last office visit May 2016  No outstanding labs

## 2016-01-24 ENCOUNTER — Other Ambulatory Visit: Payer: Self-pay | Admitting: Oncology

## 2016-01-24 ENCOUNTER — Other Ambulatory Visit: Payer: Self-pay | Admitting: Neurology

## 2016-01-24 ENCOUNTER — Encounter: Payer: Self-pay | Admitting: Neurology

## 2016-01-24 DIAGNOSIS — G35 Multiple sclerosis: Secondary | ICD-10-CM

## 2016-01-24 MED ORDER — TIZANIDINE HCL 2 MG PO CAPS *A*
ORAL_CAPSULE | ORAL | 5 refills | Status: DC
Start: 2016-01-24 — End: 2016-11-08

## 2016-02-06 ENCOUNTER — Ambulatory Visit
Admission: RE | Admit: 2016-02-06 | Discharge: 2016-02-06 | Disposition: A | Discharge status: Home / Self Care | Payer: Self-pay | Source: Ambulatory Visit | Attending: Neurology | Admitting: Neurology

## 2016-02-06 ENCOUNTER — Encounter: Payer: Self-pay | Admitting: Neurology

## 2016-02-06 DIAGNOSIS — G35 Multiple sclerosis: Secondary | ICD-10-CM

## 2016-02-06 LAB — CBC AND DIFFERENTIAL
Baso # K/uL: 0 10*3/uL (ref 0.0–0.1)
Basophil %: 0.6 %
Eos # K/uL: 0.1 10*3/uL (ref 0.0–0.5)
Eosinophil %: 1.8 %
Hematocrit: 42 % (ref 40–51)
Hemoglobin: 15.1 g/dL (ref 13.7–17.5)
IMM Granulocytes #: 0 10*3/uL (ref 0.0–0.1)
IMM Granulocytes: 0.4 %
Lymph # K/uL: 1 10*3/uL — ABNORMAL LOW (ref 1.3–3.6)
Lymphocyte %: 21.3 %
MCH: 32 pg/cell (ref 26–32)
MCHC: 36 g/dL (ref 32–37)
MCV: 89 fL (ref 79–92)
Mono # K/uL: 0.5 10*3/uL (ref 0.3–0.8)
Monocyte %: 10.6 %
Neut # K/uL: 3.2 10*3/uL (ref 1.8–5.4)
Nucl RBC # K/uL: 0 10*3/uL (ref 0.0–0.0)
Nucl RBC %: 0 /100 WBC (ref 0.0–0.2)
Platelets: 190 10*3/uL (ref 150–330)
RBC: 4.8 MIL/uL (ref 4.6–6.1)
RDW: 12.5 % (ref 11.6–14.4)
Seg Neut %: 65.3 %
WBC: 4.9 10*3/uL (ref 4.2–9.1)

## 2016-02-06 LAB — HEPATIC FUNCTION PANEL
ALT: 26 U/L (ref 0–50)
AST: 35 U/L (ref 0–50)
Albumin: 4.4 g/dL (ref 3.5–5.2)
Alk Phos: 66 U/L (ref 40–130)
Bilirubin,Direct: <0.2 mg/dL (ref 0.0–0.3)
Bilirubin,Total: 0.7 mg/dL (ref 0.0–1.2)
Total Protein: 6.6 g/dL (ref 6.3–7.7)

## 2016-02-12 ENCOUNTER — Encounter: Payer: Self-pay | Admitting: Sports Medicine

## 2016-03-25 ENCOUNTER — Ambulatory Visit
Admission: AD | Admit: 2016-03-25 | Discharge: 2016-03-25 | Disposition: A | Discharge status: Home / Self Care | Payer: Self-pay

## 2016-03-25 DIAGNOSIS — J0100 Acute maxillary sinusitis, unspecified: Secondary | ICD-10-CM

## 2016-03-25 MED ORDER — AMOXICILLIN-POT CLAVULANATE 875-125 MG PO TABS *I*
1.0000 | ORAL_TABLET | Freq: Two times a day (BID) | ORAL | 0 refills | Status: AC
Start: 2016-03-25 — End: 2016-04-04

## 2016-03-25 NOTE — UC Provider Note (Signed)
History     Chief Complaint   Patient presents with    Sinusitis     c/o cold/congestion 1 week ago. Presents today with c/o increasing pressure/congestion on left side of face. Associated c/o thick, green nasal drainage.     HPI Comments: 65 year old male with a history of allergies, asthma and Multiple Sclerosis presents for evaluation of maxillary facial pressure and dark green mucoid discharge for the past several days, following the onset of a URI over one week ago.  Denies fever, fatigue, nausea or vomiting.      Patient is a 65 y.o. male presenting with sinusitis.   History provided by:  Patient  Language interpreter used: No    Is this ED visit related to civilian activity for income:  Not work related  Sinusitis   Pain details:     Location:  Maxillary    Quality:  Pressure    Severity:  Moderate    Duration:  8 days    Timing:  Constant  Duration:  8 days  Progression:  Worsening  Chronicity:  New  Context: allergies, deviated nasal septum and recent URI    Context: not chemical odor and not smoke inhalation    Worsened by:  Nothing  Ineffective treatments:  Oral decongestants  Associated symptoms: congestion, headaches and rhinorrhea    Associated symptoms: no chest pain, no chills, no cough, no ear pain, no fatigue, no fever, no hoarse voice, no mouth breathing, no nausea, no shortness of breath, no sneezing, no snoring, no sore throat, no swollen glands, no tooth pain, no vomiting and no wheezing        Past Medical History:   Diagnosis Date    HLD (hyperlipidemia)     Multiple sclerosis     Psoriasis     Tic disorder             Past Surgical History:   Procedure Laterality Date    HX TONSILLECTOMY/ADENOIDECTOMY      INCISIONAL HERNIA REPAIR      KNEE ARTHROSCOPY Right     VASECTOMY         Family History   Problem Relation Age of Onset    Heart Disease Mother      coronary art disease, not premature    Cancer Father      multiple myeloma    Cancer Maternal Grandmother      unknown     Multiple Sclerosis Neg Hx     Lupus Neg Hx     Rheum arthritis Neg Hx     Thyroid disease Neg Hx     Diabetes Neg Hx     Colon cancer Neg Hx     Colon polyps Neg Hx     High Blood Pressure Neg Hx          Social History    reports that he has never smoked. He has never used smokeless tobacco. He reports that he does not drink alcohol or use illicit drugs. His sexual activity history is not on file.    Living Situation     Questions Responses    Patient lives with Spouse    Homeless No    Caregiver for other family member No    External Services None    Employment Retired    Domestic Violence Risk No          Review of Systems   Review of Systems   Constitutional: Negative for chills, fatigue  and fever.   HENT: Positive for congestion and rhinorrhea. Negative for ear pain, hoarse voice, sneezing and sore throat.    Eyes: Negative.    Respiratory: Negative for snoring, cough, shortness of breath and wheezing.    Cardiovascular: Negative for chest pain.   Gastrointestinal: Negative for nausea and vomiting.   Musculoskeletal: Negative.    Allergic/Immunologic: Positive for environmental allergies and immunocompromised state.   Neurological: Positive for headaches.   Hematological: Negative.    Psychiatric/Behavioral: Negative.        Physical Exam     ED Triage Vitals   BP Heart Rate Heart Rate (via Pulse Ox) Resp Temp Temp src SpO2 O2 Device O2 Flow Rate   03/25/16 1716 03/25/16 1716 -- 03/25/16 1716 03/25/16 1716 03/25/16 1716 03/25/16 1716 03/25/16 1716 --   125/88 77  16 36.6 C (97.9 F) TEMPORAL 99 % None (Room air)       Weight           --                               Physical Exam   Constitutional: He is oriented to person, place, and time. He appears well-developed and well-nourished. He does not appear ill. No distress.   HENT:   Head: Normocephalic and atraumatic.   Right Ear: External ear normal. A middle ear effusion is present.   Left Ear: Tympanic membrane, external ear and ear canal normal.    Nose: Mucosal edema, rhinorrhea and sinus tenderness present. No nose lacerations, nasal deformity or nasal septal hematoma.   Mouth/Throat: Uvula is midline. Posterior oropharyngeal erythema (and post nasal drip) present.   Neck: Normal range of motion. Neck supple.   Cardiovascular: Normal rate, regular rhythm and normal heart sounds.    Pulmonary/Chest: Effort normal and breath sounds normal. No respiratory distress. He has no wheezes. He has no rales. He exhibits no tenderness.   Musculoskeletal: Normal range of motion.   Lymphadenopathy:     He has no cervical adenopathy.   Neurological: He is alert and oriented to person, place, and time.   Skin: Skin is warm and dry.   Psychiatric: He has a normal mood and affect. His behavior is normal. Judgment and thought content normal.   Nursing note and vitals reviewed.       Medical Decision Making      No labs or imaging studies ordered  Due to the patient's immunocompromised state and that his symptoms have significantly worsened the past 3-4 days I elected to treat him with antibiotics at this time.  Initial Evaluation:  ED First Provider Contact     Date/Time Event User Comments    03/25/16 1716 ED Provider First Contact Kleo Dungee Initial Face to Face Provider Contact          Patient seen by me as above    Assessment:  65 y.o.male comes to the [facility] with URI symptoms for over one week, sinus pressure and dark green discharge for the past 3-4 days.    Differential Diagnosis includes Acute Bacterial Sinusitis  Acute Viral Sinusitis  Acute Allergic Sinusitis  Acute URI NOS                Plan:   Orders Placed This Encounter    amoxicillin-clavulanate (AUGMENTIN) 875-125 MG per tablet       No results found for this or any previous visit (  from the past 24 hour(s)).      Final Diagnosis    ICD-10-CM ICD-9-CM   1. Acute non-recurrent maxillary sinusitis J01.00 461.0       Encourage fluids, encourage rest, good hand hygiene.    Use over the counter  medications as discussed.    Please start the new medications as below:    Current Discharge Medication List      New Medications    Details Last Dose Given Next Dose Due Script Given?   amoxicillin-clavulanate (AUGMENTIN) 1 tablet Dose: 1 tablet  Take 1 tablet by mouth 2 times daily  Quantity 20 tablet, Refill 0  Start date: 03/25/2016, End date: 04/04/2016                   Please follow up with your physician as below:    Follow-up Information     Follow up with Budd, Joyice Faster, MD.    Specialty:  Primary Care    Why:  As needed    Contact information:    [address]  [facility] [address]  [place] [address]  [phone]          Thank you Wessley Emert for coming to [facility] for your health care concerns.    If your condition changes and/or worsens please follow up with her primary doctor and/or return to the urgent care center.    If short of breath, chest pains or any other concerns please report to the emergency room.    In the event of an Emergency dial 911.    Final Diagnosis  Final diagnoses:   [J01.00] Acute non-recurrent maxillary sinusitis (Primary)           Shan Levans, NP    Supervising physician Dr. Vertell Limber was immediately available     Shan Levans, NP  03/25/16 1731

## 2016-03-25 NOTE — ED Triage Notes (Signed)
c/o cold/congestion 1 week ago. Presents today with c/o increasing pressure/congestion on left side of face. Associated c/o thick, green nasal drainage.    Triage Note   Rori Goar P Luka Reisch, RN

## 2016-04-13 ENCOUNTER — Encounter: Payer: Self-pay | Admitting: Primary Care

## 2016-04-19 ENCOUNTER — Other Ambulatory Visit: Payer: Self-pay | Admitting: Primary Care

## 2016-04-20 MED ORDER — SILDENAFIL CITRATE 100 MG PO TABS *I*
ORAL_TABLET | ORAL | 2 refills | Status: DC
Start: 2016-04-20 — End: 2016-04-23

## 2016-04-20 NOTE — Telephone Encounter (Signed)
Last office visit was 02/2015 no future labs in system.  No upcoming appt scheduled      Lab results: 07/08/15  1150   Sodium 141   Potassium 4.4   Chloride 99   CO2 26   UN 17   Creatinine 0.87   GFR,Caucasian 90   GFR,Black 104   Glucose 72   Calcium 8.5*         Lab results: 02/06/16  0931 07/08/15  1150   Sodium  --  141   Potassium  --  4.4   Chloride  --  99   CO2  --  26   UN  --  17   Creatinine  --  0.87   GFR,Caucasian  --  90   GFR,Black  --  104   Glucose  --  72   Calcium  --  8.5*   Total Protein 6.6 6.8   Albumin 4.4 4.4   ALT 26 56*   AST 35 47   Alk Phos 66 109   Bilirubin,Total 0.7 0.6

## 2016-04-21 ENCOUNTER — Other Ambulatory Visit: Payer: Self-pay | Admitting: Primary Care

## 2016-04-22 NOTE — Telephone Encounter (Signed)
Last seen 02/14/15

## 2016-04-24 ENCOUNTER — Encounter: Payer: Self-pay | Admitting: Gastroenterology

## 2016-06-02 ENCOUNTER — Encounter: Payer: Self-pay | Admitting: Gastroenterology

## 2016-07-08 ENCOUNTER — Encounter: Payer: Self-pay | Admitting: Primary Care

## 2016-07-27 ENCOUNTER — Telehealth: Payer: Self-pay | Admitting: Primary Care

## 2016-07-27 DIAGNOSIS — E785 Hyperlipidemia, unspecified: Secondary | ICD-10-CM

## 2016-07-27 DIAGNOSIS — Z0000 Encounter for general adult medical examination without abnormal findings: Secondary | ICD-10-CM

## 2016-07-27 NOTE — Telephone Encounter (Signed)
Patient called and scheduled physical for 11/05/2016, please enter any needed lab work. Thanks

## 2016-07-27 NOTE — Telephone Encounter (Signed)
Please see pended labs

## 2016-07-30 ENCOUNTER — Other Ambulatory Visit: Payer: Self-pay | Admitting: Neurology

## 2016-07-30 MED ORDER — DIMETHYL FUMARATE 240 MG PO CPDR *I*
240.0000 mg | DELAYED_RELEASE_CAPSULE | Freq: Two times a day (BID) | ORAL | 5 refills | Status: DC
Start: 2016-07-30 — End: 2017-01-28

## 2016-08-30 ENCOUNTER — Encounter: Payer: Self-pay | Admitting: Primary Care

## 2016-09-07 ENCOUNTER — Encounter: Payer: Self-pay | Admitting: Primary Care

## 2016-09-07 MED ORDER — TADALAFIL 5 MG PO TABS *I*
5.0000 mg | ORAL_TABLET | Freq: Every day | ORAL | 1 refills | Status: DC
Start: 2016-09-07 — End: 2017-02-09

## 2016-09-07 NOTE — Telephone Encounter (Signed)
rx pended, then prior auth would come through if needed.

## 2016-09-11 ENCOUNTER — Telehealth: Payer: Self-pay | Admitting: Primary Care

## 2016-09-11 NOTE — Telephone Encounter (Signed)
Call from [facility] with [facility] at # [phone], requesting Rx prior auth for patient's Cialis. Molli Hazard asked that we call the insurance at .  I tried calling # [phone], it appeared to be a fax #. Prior auth form started and routed to [place].

## 2016-09-14 ENCOUNTER — Encounter: Payer: Self-pay | Admitting: Primary Care

## 2016-09-14 NOTE — Telephone Encounter (Signed)
His 11/27 e-mail mentions a specific phone number where this Rx would be promptly approved.  That number does not match the "preferred pharmacy" in his eRecord.  Are we all on the same page here?  If so, please reassure [name] that in process.  Thanks Elon Jester.

## 2016-09-14 NOTE — Telephone Encounter (Signed)
Diagnosis is ED per chart and we are submitting prior auth now via fax.

## 2016-09-14 NOTE — Telephone Encounter (Signed)
Prior auth received and working on

## 2016-09-14 NOTE — Telephone Encounter (Signed)
Call from [employer] - they are looking for a prior auth submission for Cialis. [employer] states that they cover BPH diagnoses but not ED.

## 2016-09-25 ENCOUNTER — Other Ambulatory Visit: Payer: Self-pay | Admitting: Primary Care

## 2016-09-25 NOTE — Telephone Encounter (Signed)
Last seen 02/14/15  Next appt 11/05/16

## 2016-09-27 ENCOUNTER — Ambulatory Visit
Admission: AD | Admit: 2016-09-27 | Discharge: 2016-09-27 | Disposition: A | Discharge status: Home / Self Care | Payer: Self-pay

## 2016-09-27 DIAGNOSIS — S20211A Contusion of right front wall of thorax, initial encounter: Secondary | ICD-10-CM

## 2016-09-27 NOTE — ED Triage Notes (Signed)
Patient slipped on ice while cleaning snow off car on Tuesday and landed on back. Patient now reports worsening right sided mid back pain.       Triage Note   Anne Fu, LPN

## 2016-09-27 NOTE — UC Provider Note (Signed)
History     Chief Complaint   Patient presents with    Back Pain     Patient fell on his back on Tuesday. Patient now reports worsening right sided mid back pain.     Patient is a 65 y.o. male presenting with back pain.   History provided by:  Patient  Language interpreter used: No    Back Pain   Location:  Thoracic spine  Quality:  Aching  Radiates to:  Does not radiate  Pain severity:  Moderate  Onset quality:  Sudden  Duration:  6 days  Timing:  Constant  Progression:  Worsening  Chronicity:  New  Context comment:  Patient slipped on ice in his driveway and struck his back on the ground  Relieved by:  Being still and OTC medications  Worsened by:  Palpation, movement, deep breathing, coughing and bending  Associated symptoms: no abdominal pain, no abdominal swelling, no bladder incontinence, no bowel incontinence, no chest pain, no dysuria, no fever, no headaches, no leg pain, no numbness, no paresthesias, no pelvic pain, no perianal numbness, no tingling, no weakness and no weight loss        Past Medical History:   Diagnosis Date    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      HLD (hyperlipidemia)     Multiple sclerosis     Psoriasis     Tic disorder             Past Surgical History:   Procedure Laterality Date    HX TONSILLECTOMY/ADENOIDECTOMY      INCISIONAL HERNIA REPAIR      KNEE ARTHROSCOPY Right     VASECTOMY         Family History   Problem Relation Age of Onset    Heart Disease Mother      coronary art disease, not premature    Cancer Father      multiple myeloma    Cancer Maternal Grandmother      unknown    Multiple Sclerosis Neg Hx     Lupus Neg Hx     Rheum arthritis Neg Hx     Thyroid disease Neg Hx     Diabetes Neg Hx     Colon cancer Neg Hx     Colon polyps Neg Hx     High Blood Pressure Neg  Hx          Social History    reports that he has never smoked. He has never used smokeless tobacco. He reports that he does not drink alcohol or use illicit drugs. His sexual activity history is not on file.    Living Situation     Questions Responses    Patient lives with Spouse    Homeless No    Caregiver for other family member No    External Services None    Employment Retired    Domestic Violence Risk No          Review of Systems   Review of Systems   Constitutional: Negative for fever and weight loss.   Respiratory: Negative for cough, choking, chest tightness, shortness of breath, wheezing and stridor.    Cardiovascular: Negative for chest pain.   Gastrointestinal: Negative for abdominal pain and bowel incontinence.   Genitourinary: Negative for bladder incontinence,  dysuria and pelvic pain.   Musculoskeletal: Positive for back pain.   Skin: Negative for color change and wound.   Neurological: Negative for tingling, weakness, numbness, headaches and paresthesias.   All other systems reviewed and are negative.      Physical Exam     ED Triage Vitals   BP Heart Rate Heart Rate (via Pulse Ox) Resp Temp Temp src SpO2 (Retired) O2 Device O2 Flow Rate   09/27/16 1025 09/27/16 1025 -- 09/27/16 1025 09/27/16 1025 09/27/16 1025 09/27/16 1025 -- --   131/80 58  18 36.7 C (98.1 F) TEMPORAL 99 %        Weight           --                               Physical Exam   Constitutional: He is oriented to person, place, and time. He appears well-developed and well-nourished. No distress.   HENT:   Head: Normocephalic and atraumatic.   Cardiovascular: Normal rate, regular rhythm, normal heart sounds and intact distal pulses.  Exam reveals no gallop and no friction rub.    No murmur heard.  Pulmonary/Chest: Effort normal and breath sounds normal. No respiratory distress. He has no wheezes. He has no rales.   Musculoskeletal:        Thoracic back: He exhibits bony tenderness (along posterior ribs #11 and 12), swelling (and  ecchymosis to the area) and pain. He exhibits normal range of motion, no edema, no deformity, no laceration, no spasm and normal pulse.        Back:    Neurological: He is alert and oriented to person, place, and time.   Skin: Skin is warm and dry.   Psychiatric: He has a normal mood and affect. His behavior is normal. Judgment and thought content normal.   Nursing note and vitals reviewed.       Medical Decision Making      Amount and/or Complexity of Data Reviewed  Tests in the radiology section of CPT: ordered and reviewed  Independent visualization of images, tracings, or specimens: yes        Initial Evaluation:  ED First Provider Contact     Date/Time Event User Comments    09/27/16 1037 ED First Provider Contact Lonell Face M Initial Face to Face Provider Contact          Patient was seen on: 09/27/2016        Assessment:  65 y.o.male comes to the [facility] with right posterior rib pain and bruising x 6 days due to slip and fall on ice.     Differential Diagnosis includes Chest soft tissue contusion, Rib fracture, Intercostal strain, muscle strain, Periosteal contusion, pneumothorax.                  Plan:   Orders Placed This Encounter    * Ribs RIGHT standard obliques with frontal chest       No results found for this or any previous visit (from the past 24 hour(s)).     Orders Placed This Encounter   Procedures    * Ribs RIGHT standard obliques with frontal chest     Standing Status:   Standing     Number of Occurrences:   1     Order Specific Question:   Signs and symptoms/indications     Answer:   pain posteriorly  along ribs 11 and 12 due to slip and fall on ice 6 days ago       Xrays ordered and reviewed NAD, official radiology read pending at time of discharge, any deviation from plan at time of discharge will be conveyed telephonically.     Final Diagnosis    ICD-10-CM ICD-9-CM   1. Rib contusion, right, initial encounter Y19.509T 922.1       Encourage fluids, encourage rest, good hand  hygiene.    Use over the counter medications as discussed. Recommend otc Tylenol and/or Motrin as needed for pain/fever relief. Use as directed on package labeling.      Alternate heat with ice to affected areas.     Please follow up with your physician as below:    Follow-up Information     Follow up with Ivory Broad, MD In 2 days.    Specialties:  Primary Care, Hematology and Oncology    Why:  For re-evaluation    Contact information:    [address]  [facility] [address]  [place] [address]  [phone]          If short of breath, chest pains, worsening symptoms, or any other concerns please report to the emergency room.    In the event of an Emergency dial 911.     Final Diagnosis  Final diagnoses:   [S50.539J] Rib contusion, right, initial encounter (Primary)           Andris Flurry, PA    Supervising physician Allayne Gitelman, MD was immediately available     Andris Flurry, [place]  09/27/16 [phone]

## 2016-09-27 NOTE — Discharge Instructions (Signed)
Alternate heat with ice to affected areas.     Recommend otc Tylenol and/or Motrin as needed for pain/fever relief. Use as directed on package labeling.

## 2016-09-28 ENCOUNTER — Telehealth: Payer: Self-pay | Admitting: Primary Care

## 2016-09-28 MED ORDER — OXYCODONE HCL 5 MG PO CAPS *A*
5.0000 mg | ORAL_CAPSULE | ORAL | 0 refills | Status: DC | PRN
Start: 2016-09-28 — End: 2016-11-08

## 2016-09-28 NOTE — Telephone Encounter (Signed)
Whether ribs broken or cracked or bruised, the pain can be considerable, and the treatment is the same.  I'd be happy to provide oxycodone for the first week, but will have to be very careful with constipation avoidance, and may find opioid sedating as well.    He had Vicodin for shingles in 2010.  Was that helpful and well tolerated?

## 2016-09-28 NOTE — Telephone Encounter (Signed)
I spoke with [name], he doesn't remember how the vicodin worked, interested in the oxycodone.  Oxy ir pended so can still use tylenol prn between if needed, istop done.  Pharmacy verified and he was educated on constipation avoidance.

## 2016-09-28 NOTE — Telephone Encounter (Signed)
[name] called because he was at [facility] yesterday for fall and rib pain, was told nothing is broken and was told to take advil alternating with tylenol.  He states this isn't helping enough.  He has been taking 600 mg 2-3 times a day, he knows it can be hard on the stomach.  He was advised to brace when he coughs, turns, stands up.  He is asking if there is something for better pain control you could advise on, also asked if you could review the x ray because he didn't think the "highest" of UC.  There is note of stool so he has been working on regulating BM. No pneumothorax seen and he isn't complaining of SOB, just bruising and pain with movement.  Please advise.(he's aware you aren't here until 11)

## 2016-10-19 ENCOUNTER — Other Ambulatory Visit
Admission: RE | Admit: 2016-10-19 | Discharge: 2016-10-19 | Disposition: A | Discharge status: Home / Self Care | Payer: Self-pay | Source: Ambulatory Visit | Attending: Primary Care | Admitting: Primary Care

## 2016-10-19 DIAGNOSIS — E785 Hyperlipidemia, unspecified: Secondary | ICD-10-CM

## 2016-10-19 DIAGNOSIS — Z0000 Encounter for general adult medical examination without abnormal findings: Secondary | ICD-10-CM

## 2016-10-19 LAB — COMPREHENSIVE METABOLIC PANEL
ALT: 25 U/L (ref 0–50)
AST: 34 U/L (ref 0–50)
Albumin: 4.1 g/dL (ref 3.5–5.2)
Alk Phos: 65 U/L (ref 40–130)
Anion Gap: 11 (ref 7–16)
Bilirubin,Total: 0.8 mg/dL (ref 0.0–1.2)
CO2: 30 mmol/L — ABNORMAL HIGH (ref 20–28)
Calcium: 9.2 mg/dL (ref 8.6–10.2)
Chloride: 102 mmol/L (ref 96–108)
Creatinine: 0.94 mg/dL (ref 0.67–1.17)
GFR,Black: 97 *
GFR,Caucasian: 84 *
Glucose: 94 mg/dL (ref 60–99)
Lab: 13 mg/dL (ref 6–20)
Potassium: 3.9 mmol/L (ref 3.3–5.1)
Sodium: 143 mmol/L (ref 133–145)
Total Protein: 6.3 g/dL (ref 6.3–7.7)

## 2016-10-19 LAB — PSA (EFF.4-2010): PSA (eff. 4-2010): 0.96 ng/mL (ref 0.00–4.00)

## 2016-10-19 LAB — LIPID PANEL
Chol/HDL Ratio: 3.6
Cholesterol: 185 mg/dL
HDL: 52 mg/dL
LDL Calculated: 114 mg/dL
Non HDL Cholesterol: 133 mg/dL
Triglycerides: 94 mg/dL

## 2016-10-19 LAB — TSH: TSH: 3.35 u[IU]/mL (ref 0.27–4.20)

## 2016-10-19 LAB — HEMOGLOBIN A1C: Hemoglobin A1C: 4.7 % (ref 4.0–6.0)

## 2016-10-23 ENCOUNTER — Encounter: Payer: Self-pay | Admitting: Primary Care

## 2016-10-23 DIAGNOSIS — M25551 Pain in right hip: Secondary | ICD-10-CM

## 2016-10-23 NOTE — Telephone Encounter (Signed)
Referral faxed over

## 2016-10-23 NOTE — Telephone Encounter (Addendum)
Please see pended order, I sent patient message letting him know we are sending this over there.

## 2016-10-23 NOTE — Telephone Encounter (Signed)
Can enter if you want.[name]

## 2016-11-05 ENCOUNTER — Encounter: Payer: Self-pay | Admitting: Primary Care

## 2016-11-05 ENCOUNTER — Ambulatory Visit: Payer: Self-pay | Admitting: Primary Care

## 2016-11-05 VITALS — BP 106/68 | HR 56 | Resp 14 | Ht 71.0 in | Wt 170.0 lb

## 2016-11-05 DIAGNOSIS — Z0000 Encounter for general adult medical examination without abnormal findings: Secondary | ICD-10-CM

## 2016-11-05 DIAGNOSIS — Z23 Encounter for immunization: Secondary | ICD-10-CM

## 2016-11-05 LAB — PCMH FALL RISK PLAN

## 2016-11-05 LAB — PCMH DEPRESSION ASSESSMENT

## 2016-11-05 LAB — PCMH FALL RISK ASSESSMENT

## 2016-11-05 MED ORDER — TAMSULOSIN HCL 0.4 MG PO CAPS *I*
0.4000 mg | ORAL_CAPSULE | Freq: Every evening | ORAL | 5 refills | Status: DC
Start: 2016-11-05 — End: 2016-11-12

## 2016-11-05 MED ORDER — ZOLPIDEM TARTRATE 12.5 MG PO TBCR *I*
12.5000 mg | ORAL_TABLET | Freq: Every evening | ORAL | 0 refills | Status: DC | PRN
Start: 2016-11-05 — End: 2016-12-12

## 2016-11-05 NOTE — H&P (Signed)
History and Physical    HISTORY:  Chief Complaint   Patient presents with    Annual Exam         History of Present Illness:    HPI  Slowing of urinary stream, dribbling.  No pain, no blood.  Interrupted sleep, sometimes urinary sometimes not; some trouble resuming.  Muscle irritability/cramping (MS?), spasms, tried tizanidine, baclofen, no benefit from latter, some annoying side effects with tizanidine.  Intestinal gas.  E.D. -- tried 5 and 10 mg doses of Cialis, not enough efficacy there.  100 mg Viagra also was marginally helpful.    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      HLD (hyperlipidemia)     Multiple sclerosis     Psoriasis     Tic disorder      Past Surgical History:   Procedure Laterality Date    HX TONSILLECTOMY/ADENOIDECTOMY      INCISIONAL HERNIA REPAIR      KNEE ARTHROSCOPY Right     VASECTOMY           Allergies:    Allergies   Allergen Reactions    Environmental Allergies     Environmental [Mold]        Current medications:    Current Outpatient Prescriptions   Medication Sig    tadalafil (CIALIS) 5 MG tablet Take 1 tablet (5 mg total) by mouth daily    dimethyl fumarate (TECFIDERA) 240 mg DR capsule Take 1 capsule (240 mg total) by mouth 2 times daily   TAKE 1 CAPSULE ('240MG'$ ) BY MOUTH TWICE A DAY.    fluticasone-salmeterol (ADVAIR DISKUS) 250-50 MCG/DOSE diskus inhaler Inhale 1 puff into the lungs 2 times daily     cetirizine (ZYRTEC) 10 MG tablet Take 10 mg by mouth daily    fluticasone (FLONASE) 50 MCG/ACT nasal spray 1 spray by Nasal route daily    cholecalciferol (VITAMIN D) 1000 UNIT tablet Take 1,000 Units by mouth daily    aqscorbic acid (VITAMIN C) 1000 MG tablet Take 1,000 mg by mouth daily    Cyanocobalamin (VITAMIN B-12 CR PO) Take 1,000 mcg by mouth daily       albuterol (PROVENTIL HFA) 108 (90 BASE) MCG/ACT inhaler INHALE 2 PUFFS EVERY 6 HOURS AS NEEDED    oxyCODONE (ROXICODONE) 5 MG immediate release tablet Take 5 mg by mouth every 4 hours as needed   for pain    VIAGRA 100 MG tablet     oxyCODONE (OXY-IR) 5 MG immediate release capsule Take 1 capsule (5 mg total) by mouth every 4 hours as needed for Pain   Max daily dose: 30 mg    tiZANidine (ZANAFLEX) 2 MG capsule Start with one cap at bedtime for 2-3 days.  Increase to 3 times a day if tolerated without too much sleepiness.  Family History:    Family History   Problem Relation Age of Onset    Heart Disease Mother      coronary art disease, not premature    Cancer Father      multiple myeloma    Cancer Maternal Grandmother      unknown    Multiple Sclerosis Neg Hx     Lupus Neg Hx     Rheum arthritis Neg Hx     Thyroid disease Neg Hx     Diabetes Neg Hx     Colon cancer Neg Hx     Colon polyps Neg Hx     High Blood Pressure Neg Hx        Social/Occupational History:   Social History     Social History    Marital status: Married     Spouse name: N/A    Number of children: N/A    Years of education: N/A     Social History Main Topics    Smoking status: Never Smoker    Smokeless tobacco: Never Used    Alcohol use No      Comment: Very occasionaly    Drug use: No    Sexual activity: Not Asked     Other Topics Concern    None     Social History Programmer, 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        Exercise -- doubles tennis or pickle ball up to 5 times weekly, joined [employer] and plans to add some resistance  machines.        Diet -- varied, more lean proteins, complex carbs, not a lot of dessert, some greens.  Protein shakes at bkfst and lunch, flax, seeds, yogurt.        Sleep -- active dreams, variable sleep success.  No GU interruption.        Safety -- seatbelt and no distractions, smoke detectors, sunglasses, plans to be more consistent with skin block.         Review of Systems:    Review of Systems   Constitutional: Negative for diaphoresis and malaise/fatigue.   HENT: Negative for nosebleeds and sore throat.         No change in headache pattern, none intrusive.  Hearing quite functional.   Eyes: Negative for double vision.   Respiratory: Negative for cough, sputum production and shortness of breath.    Cardiovascular: Negative for chest pain, palpitations and leg swelling.   Gastrointestinal: Negative for abdominal pain, blood in stool, constipation, diarrhea, melena, nausea and vomiting.        Heartburn rare, no swallowing difficulties.   Genitourinary: Negative for dysuria and flank pain.   Musculoskeletal: Negative for falls and joint pain.   Skin: Negative for rash.        No active moles noted.   Neurological: Negative for dizziness, tremors and weakness.   Endo/Heme/Allergies: Negative for polydipsia.   Psychiatric/Behavioral: Negative for depression and memory loss. The patient is not nervous/anxious.        Vital Signs:   BP 106/68   Pulse 56   Resp 14   Ht 1.803 m ('5\' 11"'$ )   Wt 77.1 kg (170 lb)   BMI 23.71 kg/m2      PHYSICAL EXAM:  Physical Exam   Constitutional: He is oriented to person, place, and time. He appears well-developed and well-nourished. No distress.   HENT:   Mouth/Throat: Oropharynx is clear and  moist. No oropharyngeal exudate.   Eyes: Conjunctivae and EOM are normal. No scleral icterus.   Neck: Normal range of motion. Neck supple. No tracheal deviation present. No thyromegaly present.   Cardiovascular: Normal rate, regular rhythm, normal heart sounds and intact distal pulses.  Exam  reveals no gallop.    No murmur heard.  Pulmonary/Chest: Effort normal and breath sounds normal. No stridor. No respiratory distress. He has no wheezes. He has no rales. He exhibits no tenderness.   Abdominal: Soft. Bowel sounds are normal. He exhibits no distension and no mass. There is no hepatosplenomegaly. There is no tenderness.   Genitourinary: Rectum normal and prostate normal.   Musculoskeletal: Normal range of motion. He exhibits no edema.   Lymphadenopathy:     He has no cervical adenopathy.     He has no axillary adenopathy.        Right: No supraclavicular adenopathy present.        Left: No supraclavicular adenopathy present.   Neurological: He is alert and oriented to person, place, and time. No cranial nerve deficit. Coordination normal.   Skin: No rash noted. He is not diaphoretic.   Psychiatric: He has a normal mood and affect. His behavior is normal.     Component      Latest Ref Rng & Units 10/19/2016 03/19/2015 02/04/2015   WBC      4.2 - 9.1 THOU/uL   4.8   RBC      4.6 - 6.1 MIL/uL   4.8   Hemoglobin      13.7 - 17.5 g/dL   14.7   Hematocrit      40 - 51 %   43   MCV      79 - 92 fL   89   MCH      26 - 32 pg/cell   31   MCHC      32 - 37 g/dL   35   RDW      11.6 - 14.4 %   12.4   Platelets      150 - 330 THOU/uL   179   Seg Neut %      %   62.8   Lymphocyte %      %   24.2   Monocyte %      %   10.1   Eosinophil %      %   2.3   Basophil %      %   0.4   Neut # K/uL      1.8 - 5.4 THOU/uL   3.0   Lymph # K/uL      1.3 - 3.6 THOU/uL   1.2 (L)   Mono # K/uL      0.3 - 0.8 THOU/uL   0.5   Eos # K/uL      0.0 - 0.5 THOU/uL   0.1   Baso # K/uL      0.0 - 0.1 THOU/uL   0.0   Nucl RBC %      0.0 - 0.2 /100 WBC   0.0   Nucl RBC # K/uL      0.0 - 0.0 THOU/uL   0.0   IMM Granulocytes #      0.0 - 0.1 THOU/uL   0.0   IMM Granulocytes      %   0.2   Sodium      133 - 145 mmol/L 143  Potassium      3.3 - 5.1 mmol/L 3.9     Chloride      96 - 108 mmol/L 102     CO2      20 - 28 mmol/L 30 (H)     Anion Gap       7 - 16 11     UN      6 - 20 mg/dL 13     Creatinine      0.67 - 1.17 mg/dL 0.94     GFR,Caucasian      * 84     GFR,Black      * 97     Glucose      60 - 99 mg/dL 94     Calcium      8.6 - 10.2 mg/dL 9.2     Total Protein      6.3 - 7.7 g/dL 6.3  6.4   Albumin      3.5 - 5.2 g/dL 4.1  4.3   Bilirubin,Total      0.0 - 1.2 mg/dL 0.8  0.8   AST      0 - 50 U/L 34  36   ALT      0 - 50 U/L 25  34   Alk Phos      40 - 130 U/L 65  62   Bilirubin,Direct      0.0 - 0.3 mg/dL   0.2   Cholesterol      mg/dL 185 185 136   Triglycerides      mg/dL 94 48 60   HDL      mg/dL 52 57 58   LDL Calculated      mg/dL 114 118 66   Non HDL Cholesterol      mg/dL 133 128 78   Chol/HDL Ratio       3.6 3.2 2.3   TSH      0.27 - 4.20 uIU/mL 3.35     Hemoglobin A1C      4.0 - 6.0 % 4.7     PSA (eff. 01-2009)      0.00 - 4.00 ng/mL 0.96           Assessment:       GU -- discussed BPH options, wants to try alpha blocker.  Interrupted sleep -- discussed hygiene, ADR's with hypnotics.  Will use zolpidem judiciously, report.  Muscle cramping in MS, no metabolic underpinnings evident.  Neurologist to address.  ED -- has not tried higher dose Cialis, will do so.  CV -- risk being appropriately addressed, and no sx of concern.  CA screening on track, schedule reviewed.  Immunization schedule reviewed, shingrix?   .      Plan:        Will discuss leg cramps further with neurologist.  Will try taking 15 to 20 mg Cialis.  Will consider behavioral options for intestinal gas.  Will try judicious use of ext release zolpidem for insomnia  Will try tamsulosin 0.4 mg nightly to see if improves bladder emptying, urine flow.  Talk to your neurologist about the new shingles vaccine.

## 2016-11-05 NOTE — Patient Instructions (Addendum)
Will discuss leg cramps further with neurologist.  Will try taking 15 to 20 mg Cialis.  Will consider behavioral options for intestinal gas.  Will try judicious use of ext release zolpidem for insomnia  Will try tamsulosin 0.4 mg nightly to see if improves bladder emptying, urine flow.  Talk to your neurologist about the new shingles vaccine.

## 2016-11-08 ENCOUNTER — Encounter: Payer: Self-pay | Admitting: Primary Care

## 2016-11-11 ENCOUNTER — Encounter: Payer: Self-pay | Admitting: Primary Care

## 2016-11-11 NOTE — Telephone Encounter (Signed)
Zolpidem sent to [facility] 1/25

## 2016-11-12 ENCOUNTER — Encounter: Payer: Self-pay | Admitting: Primary Care

## 2016-11-12 MED ORDER — ALFUZOSIN HCL 10 MG PO TB24 *I*
10.0000 mg | ORAL_TABLET | Freq: Every day | ORAL | 0 refills | Status: DC
Start: 2016-11-12 — End: 2016-12-11

## 2016-12-10 ENCOUNTER — Encounter: Payer: Self-pay | Admitting: Primary Care

## 2016-12-11 ENCOUNTER — Other Ambulatory Visit: Payer: Self-pay | Admitting: Primary Care

## 2016-12-11 NOTE — Telephone Encounter (Signed)
Please see his my chart message yesterday for this

## 2016-12-12 ENCOUNTER — Encounter: Payer: Self-pay | Admitting: Primary Care

## 2016-12-12 DIAGNOSIS — R3911 Hesitancy of micturition: Secondary | ICD-10-CM

## 2016-12-12 DIAGNOSIS — N529 Male erectile dysfunction, unspecified: Secondary | ICD-10-CM

## 2016-12-12 MED ORDER — ZOLPIDEM TARTRATE 12.5 MG PO TBCR *I*
12.5000 mg | ORAL_TABLET | Freq: Every evening | ORAL | 0 refills | Status: DC | PRN
Start: 2016-12-12 — End: 2017-01-17

## 2016-12-13 ENCOUNTER — Encounter: Payer: Self-pay | Admitting: Primary Care

## 2016-12-16 NOTE — Progress Notes (Signed)
Multiple Sclerosis Clinic Follow-up Visit    Subjective:  [name] is a 66 y.o. M here for a regular follow-up of relapsing remitting MS. Since his 12/15 clinic visit he has been experiencing increased frequency of R thigh "spasms"/abnormal sensation. These are brief but bothersome. He is able to continue playing tennis, although he worries that he might fall due to sense of instability, although it has never caused him to fall. He tried the baclofen(max dose uncertain), possibly the Tegretol, maybe with side effects, but it has been a while and he does not remember the details of past med trials.    He has difficulty maintaining sleep, waking up sometimes needing to use the bathroom or with an active mind.   Hand/foot tingling is unchanged.  He will be working with a urologist.    [name] is ongoing without missed doses.    MS History:  Clinical presentations:   1990's R arm weakness   Later numbness hands and feet and stereotyped abnormal distal R thigh sensation  MRIs:   4/08 brain-relatively mild lesion burden, 1 enhancing lesion at the time   5/13 brain-no new lesions   11/14 brain-unchanged   10/16 brain-unchanged  Other testing:   1/8 LFT wnl   4/17 abs lymph 1.0  Disease modifying treatment hx:   Betaseron started in 1990's, stopped due to injection site reactions/infections   Tecfidera started 5/13 and continued through the present  Symptomatic treatment hx:   ED-Cialis helps   Foot cramps-baclofen can be helpful   Stereotyped R thigh abnormal sensation-will try carbamazepine, baclofen    Past Medical History:   Diagnosis Date    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 Never smoker.       Meds:  Current Outpatient Prescriptions on File Prior to Visit   Medication Sig Dispense Refill    zolpidem (AMBIEN CR) 12.5 MG CR tablet Take 1 tablet (12.5 mg total) by mouth nightly as needed for Sleep   Max daily dose: 12.5 mg Swallow whole. Do not crush, break, or chew. 15 tablet 0    tadalafil (CIALIS) 5 MG tablet Take 1 tablet (5 mg total) by mouth daily 90 tablet 1    dimethyl fumarate (TECFIDERA) 240 mg DR capsule Take 1 capsule (240 mg total) by mouth 2 times daily   TAKE 1 CAPSULE (240MG ) BY MOUTH TWICE A DAY. 60 capsule 5    fluticasone-salmeterol (ADVAIR DISKUS) 250-50 MCG/DOSE diskus inhaler Inhale 1 puff into the lungs 2 times daily      cetirizine (ZYRTEC) 10 MG tablet Take 10 mg by mouth daily      fluticasone (FLONASE) 50 MCG/ACT nasal spray 1 spray by Nasal route daily      cholecalciferol (VITAMIN D) 1000 UNIT tablet Take 1,000  Units by mouth daily      aqscorbic acid (VITAMIN C) 1000 MG tablet Take 1,000 mg by mouth daily      Cyanocobalamin (VITAMIN B-12 CR PO) Take 1,000 mcg by mouth daily         albuterol (PROVENTIL HFA) 108 (90 BASE) MCG/ACT inhaler INHALE 2 PUFFS EVERY 6 HOURS AS NEEDED 7 0    alfuzosin (UROXATRAL) 10 MG 24 hr tablet TAKE 1 TABLET BY MOUTH EVERY DAY WITH BREAKFAST 30 tablet 0     No current facility-administered medications on file prior to visit.         ROS:  10 point ROS negative other than per HPI    Vitals  Blood pressure 125/80, pulse 61, height 1.803 m (5\' 11" ), weight 78 kg (172 lb).    PE:  Gen: Well appearing, NAD.  MS: Alert. Fluent. Affect appropriate for situation.  CN: Pupils 4 to 3mm without APD.  Choppy pursuits. Full facial strength. Hearing full to soft voice.   Motor: Normal tone.  (R/L) FE 5/5, HF 5/5-, ADF 5/5. No pronator  drift.  DTRs: 2+ biceps, BR, 3+patellars.   Sens: Temp decreased in R>LLE.  Coord: No ataxia on FTN.  Gait: Mildly wide-based casual gait.     Assesment and Plan:  66 y.o. M with relatively mild relapsing remitting MS on Tecfidera as DMT who has not had recent clinical or radiologic breakthough disease. He has only very mild lymphopenia and tolerates this med, so it will be continued with Q6 month CBC and LFT.   Abnormal leg sensation a stereotyped MS related symptom, so carbamazepine is the most likely to help treatment. He was asked to trial 100mg  x1 week QHS, increased to 200mg  QHS. If needed could add 100mg  QAM. He will inform us of effect of this med so that it can be adjusted as needed.  MRI brain 10/18.    Return to clinic in 6 months.    Arin Vanosdol, MD  Neuroimmunology

## 2016-12-17 ENCOUNTER — Telehealth: Payer: Self-pay | Admitting: Urology

## 2016-12-17 ENCOUNTER — Encounter: Payer: Self-pay | Admitting: Neurology

## 2016-12-17 ENCOUNTER — Ambulatory Visit: Payer: Medicare (Managed Care) | Attending: Neurology | Admitting: Neurology

## 2016-12-17 ENCOUNTER — Other Ambulatory Visit
Admission: RE | Admit: 2016-12-17 | Discharge: 2016-12-17 | Disposition: A | Discharge status: Home / Self Care | Payer: Medicare (Managed Care) | Source: Ambulatory Visit

## 2016-12-17 VITALS — BP 125/80 | HR 61 | Ht 71.0 in | Wt 172.0 lb

## 2016-12-17 DIAGNOSIS — G35 Multiple sclerosis: Secondary | ICD-10-CM

## 2016-12-17 DIAGNOSIS — R202 Paresthesia of skin: Secondary | ICD-10-CM | POA: Insufficient documentation

## 2016-12-17 LAB — CBC AND DIFFERENTIAL
Baso # K/uL: 0 10*3/uL (ref 0.0–0.1)
Basophil %: 0.7 %
Eos # K/uL: 0.1 10*3/uL (ref 0.0–0.5)
Eosinophil %: 1.3 %
Hematocrit: 44 % (ref 40–51)
Hemoglobin: 15 g/dL (ref 13.7–17.5)
IMM Granulocytes #: 0 10*3/uL (ref 0.0–0.1)
IMM Granulocytes: 0.4 %
Lymph # K/uL: 1.2 10*3/uL — ABNORMAL LOW (ref 1.3–3.6)
Lymphocyte %: 21.1 %
MCH: 30 pg/cell (ref 26–32)
MCHC: 34 g/dL (ref 32–37)
MCV: 88 fL (ref 79–92)
Mono # K/uL: 0.7 10*3/uL (ref 0.3–0.8)
Monocyte %: 11.8 %
Neut # K/uL: 3.6 10*3/uL (ref 1.8–5.4)
Nucl RBC # K/uL: 0 10*3/uL (ref 0.0–0.0)
Nucl RBC %: 0 /100 WBC (ref 0.0–0.2)
Platelets: 197 10*3/uL (ref 150–330)
RBC: 5 MIL/uL (ref 4.6–6.1)
RDW: 12.2 % (ref 11.6–14.4)
Seg Neut %: 64.7 %
WBC: 5.5 10*3/uL (ref 4.2–9.1)

## 2016-12-17 LAB — HEPATIC FUNCTION PANEL
ALT: 20 U/L (ref 0–50)
AST: 23 U/L (ref 0–50)
Albumin: 4.6 g/dL (ref 3.5–5.2)
Alk Phos: 67 U/L (ref 40–130)
Bilirubin,Direct: <0.2 mg/dL (ref 0.0–0.3)
Bilirubin,Total: 0.7 mg/dL (ref 0.0–1.2)
Total Protein: 6.8 g/dL (ref 6.3–7.7)

## 2016-12-17 MED ORDER — CARBAMAZEPINE 100 MG PO TB12 *I*
ORAL_TABLET | ORAL | 11 refills | Status: DC
Start: 2016-12-17 — End: 2017-06-18

## 2016-12-17 NOTE — Patient Instructions (Signed)
·   For the 1st week take 1 pill of carbamazepine at bedtime. For the second week and beyond please take 2 pills at bedtime. Call after being on the higher dose for 3-4 weeks or sooner if you have side effects to let us know how it's going.   Have blood drawn around Sept (needs to be drawn 2x/year while on Tecfidera).

## 2016-12-17 NOTE — Comprehensive Assessment (Unsigned)
Manual Dexterity Test    Dominant Hand:   right     Left Hand Average Time:   [phone]     Right Hand Average Time:   [phone]         My Health    Please indicate your sex.:   male     Please indicate your race (choose all that apply):   white     Other (please specify):       Please indicate your ethnicity.:   not_hispanic_or_latino     Please indicate your years of education.:   18     Please indicate the hand you learned to write with (choose one).:   right     Please indicate the age you experienced your first symptom that you can definitely attribute to MS.:   34     Please indicate the age when you were diagnosed with MS.:   34     Pick the statement that best describes what MS has been like for you.:       Please indicate how many relapses you experienced in the past 12 months. An MS relapse is generally defined as a new or worsening symptom lasting 24 hours or more in absence of fever or infection. :   1     Are you currently taking any of the following disease modifying medications?:   oral tecfidera        When did you start taking Tecfidera (dimethyl fumarate)?:   92010071     Have you ever received intravenous or high-dose oral steroids for your MS?:   yes     When was the last time you received intravenous or high-dose oral steroids for your MS?:       What was the most recent disease modifying medication you were taking prior to Tecfidera (dimethyl fumarate)?:   injectable betaseron        When was the last dose of Betaseron (interferon beta-1b)?:       How many years were you taking Betaseron (interferon beta-1b)?:   15_years     What is your primary insurance?:   medicare     What is your employment status?:   full_time     What is your current living situation?:   home_without_assistance     Please read the choices listed below and choose the one that best describes your own situation:   mild_disability         Neuro-QoL    upper_extremity:   [phone]      lower_extremity:   15.83094076808811     sleep:   03.15945859292446     fatigue:   28.63817711657903     anxiety:   [phone]     depression:   04.59977414239532     stigma:   [phone]     positive_affect_well_being:   29.02111552080223     cognitive_function:   36.1224497530051     ability_participate_social_roles:   10.21117356701410     satisfaction_social_roles:   30.13143888757972     emotional_behavioral_dyscontrol:   82.06015615379432         Number Processing Speed Test    Total Number Correct:   61         Walking Speed Test    Average Trial Time:   4.171040500004892     AFO Choice:   none     Walking Aid Choice:   none         Contrast Test    Test Canceled:   unable

## 2016-12-17 NOTE — Telephone Encounter (Signed)
[name] called, patient is scheduled for an NPV on:    Date: 05/14/17  Provider: Dr. Ron Agee  Diagnosis/Symptoms: ED    Is this appointment is over 2 weeks out? yes  Does the patient want a sooner appointment if available? yes  Is the patient ok with waiting until the date scheduled to be seen? no    The patient can be reached at [phone] if necessary.

## 2016-12-20 ENCOUNTER — Encounter: Payer: Self-pay | Admitting: Neurology

## 2016-12-21 NOTE — Telephone Encounter (Signed)
Please contact patient and make him/her aware of appointment date/time and location.     NPV - Please have patient contact referring physician, records will need to be faxed to office ASAP    4/16 @ 9:15am

## 2016-12-21 NOTE — Telephone Encounter (Signed)
Spoke with patient and he confirmed his date and time with location

## 2016-12-28 ENCOUNTER — Encounter: Payer: Self-pay | Admitting: Primary Care

## 2016-12-28 NOTE — Telephone Encounter (Signed)
Okay to administer.

## 2017-01-01 ENCOUNTER — Ambulatory Visit: Payer: Medicare (Managed Care) | Attending: Primary Care

## 2017-01-01 DIAGNOSIS — Z23 Encounter for immunization: Secondary | ICD-10-CM

## 2017-01-01 NOTE — Progress Notes (Signed)
Patient received shingrix #1 today in left deltoid.  Tolerated well, second dose scheduled.

## 2017-01-04 ENCOUNTER — Encounter: Payer: Self-pay | Admitting: Neurology

## 2017-01-04 DIAGNOSIS — G35 Multiple sclerosis: Secondary | ICD-10-CM

## 2017-01-05 MED ORDER — METHYLPREDNISOLONE SOD SUCC 1000 MG IJ SOLR (125 MG/ML) *WRAPPED*
1000.0000 mg | Freq: Every day | INTRAMUSCULAR | 2 refills | Status: AC
Start: 2017-01-05 — End: 2017-01-08

## 2017-01-05 MED ORDER — HEPARIN LOCK FLUSH 10 UNIT/ML IJ SOLN WRAPPED *I*
10.0000 [IU] | Freq: Every day | INTRAVENOUS | 0 refills | Status: AC | PRN
Start: 2017-01-05 — End: 2017-01-07

## 2017-01-05 MED ORDER — SODIUM CHLORIDE 0.9 % INJ (FLUSH) WRAPPED *I*
10.0000 mL | 0 refills | Status: AC | PRN
Start: 2017-01-05 — End: 2017-01-08

## 2017-01-05 MED ORDER — IV CATHETER MISC
0 refills | Status: DC
Start: 2017-01-05 — End: 2017-06-18

## 2017-01-05 NOTE — Telephone Encounter (Signed)
Phone call to patient. He states that the symptoms in his legs are new and he is very concerned that he is having a relapse. He states that caramazepine is not working.   Will refer for 3 days IVMP, preferrably to be administered from home. Will also add MRI T spine to current orders, and try to re-schedule MRIs for April 16th-19th (patient states that he will be more readily available then).

## 2017-01-05 NOTE — Telephone Encounter (Signed)
Referral faxed to [facility].  Medications and supplies added to meds/orders.  Please review and sign.

## 2017-01-05 NOTE — Telephone Encounter (Signed)
Patient is returning [facility] call. Please call him back as he states that he feels like he is going to have an attack. Writer tried to reach nurses but was unsuccessful.

## 2017-01-05 NOTE — Telephone Encounter (Signed)
Phone call to patient, no answer. Left message asking him to return call regarding these symptoms.

## 2017-01-05 NOTE — Telephone Encounter (Signed)
Please see My Chart message and advise.

## 2017-01-05 NOTE — Telephone Encounter (Signed)
Patient returning [name] call again. Patient is ducking in and out of appointments at the moment but would like another call back.

## 2017-01-05 NOTE — Telephone Encounter (Signed)
Patient calling back, asking to speak with PA Artist Pais

## 2017-01-06 ENCOUNTER — Encounter: Payer: Self-pay | Admitting: Neurology

## 2017-01-06 NOTE — Telephone Encounter (Signed)
Plan from last office visit:   For the 1st week take 1 pill of carbamazepine at bedtime. For the second week and beyond please take 2 pills at bedtime. Call after being on the higher dose for 3-4 weeks or sooner if you have side effects to let us know how it's going.      He has been on for 20days, increase or taper off? He states no benefit

## 2017-01-07 ENCOUNTER — Encounter: Payer: Self-pay | Admitting: Neurology

## 2017-01-11 ENCOUNTER — Encounter: Payer: Self-pay | Admitting: Neurology

## 2017-01-11 DIAGNOSIS — F419 Anxiety disorder, unspecified: Secondary | ICD-10-CM

## 2017-01-13 ENCOUNTER — Other Ambulatory Visit: Payer: Self-pay | Admitting: Neurology

## 2017-01-13 MED ORDER — PREDNISONE 10 MG PO TABS *I*
ORAL_TABLET | ORAL | 0 refills | Status: DC
Start: 2017-01-13 — End: 2017-02-17

## 2017-01-17 ENCOUNTER — Other Ambulatory Visit: Payer: Self-pay | Admitting: Primary Care

## 2017-01-18 ENCOUNTER — Other Ambulatory Visit: Payer: Self-pay | Admitting: Primary Care

## 2017-01-18 NOTE — Telephone Encounter (Signed)
Last seen Jan 2018 with upcoming appt in July

## 2017-01-19 ENCOUNTER — Other Ambulatory Visit: Payer: Medicare (Managed Care) | Admitting: Radiology

## 2017-01-19 ENCOUNTER — Telehealth: Payer: Self-pay | Admitting: Urology

## 2017-01-19 MED ORDER — ZOLPIDEM TARTRATE 12.5 MG PO TBCR *I*
12.5000 mg | ORAL_TABLET | Freq: Every evening | ORAL | 0 refills | Status: DC | PRN
Start: 2017-01-19 — End: 2017-03-04

## 2017-01-19 MED ORDER — DIAZEPAM 5 MG PO TABS *I*
ORAL_TABLET | ORAL | 0 refills | Status: DC
Start: 2017-01-19 — End: 2017-06-18

## 2017-01-19 NOTE — Telephone Encounter (Signed)
Duplicate rx, signed this morning. Please refuse

## 2017-01-19 NOTE — Telephone Encounter (Signed)
New patient information is in ER from 11/05/16

## 2017-01-21 ENCOUNTER — Encounter: Payer: Self-pay | Admitting: Neurology

## 2017-01-22 ENCOUNTER — Encounter: Payer: Self-pay | Admitting: Gastroenterology

## 2017-01-25 ENCOUNTER — Encounter: Payer: Self-pay | Admitting: Urology

## 2017-01-25 ENCOUNTER — Telehealth: Payer: Self-pay | Admitting: Urology

## 2017-01-25 ENCOUNTER — Ambulatory Visit: Payer: Medicare (Managed Care) | Attending: Primary Care | Admitting: Urology

## 2017-01-25 VITALS — BP 110/65 | HR 68 | Ht 70.98 in | Wt 172.0 lb

## 2017-01-25 DIAGNOSIS — N401 Enlarged prostate with lower urinary tract symptoms: Secondary | ICD-10-CM

## 2017-01-25 DIAGNOSIS — N529 Male erectile dysfunction, unspecified: Secondary | ICD-10-CM

## 2017-01-25 DIAGNOSIS — R339 Retention of urine, unspecified: Secondary | ICD-10-CM

## 2017-01-25 DIAGNOSIS — N3943 Post-void dribbling: Secondary | ICD-10-CM

## 2017-01-25 LAB — POCT BLADDER SCAN PVR: Residual mL: 19

## 2017-01-25 NOTE — Telephone Encounter (Signed)
Spoke with the patient. Requested he drop off a urine sample at any [facility] lab for urine culture, cytology and urinalysis as he was unable to leave a sample at his office visit today and Dr.Gentile would like to order the studies. He was agreeable.

## 2017-01-25 NOTE — Progress Notes (Signed)
Chief Complaint : BPH, ED    HPI : [name] is a 66 y.o. male who is being seen today for evaluation of BPH and ED.   BPH  He is voiding with a good urinary stream.  He denies hesitancy.  He denies urgency.  He denies urinary frequency.  He denies intermittency. He reports no dysuria or hematuria.  He has on average 0-1 x nocturia.  He is concerned about post-void dribbling only.  He is currently using no GU medication.  Alfuzosin was unhelpful.  He is no longer using that.   ED  The patient reports the gradual onset of difficulty obtaining and maintaining an erection over the last 4-5 years.  At present he is unable to achieve an erection on his own that is satisfactory for sexual activity.  He gets rare, soft morning erections.  Erections with attempted masturbation are no better than with attempted sexual relations.  He describes his libido as "good."  His relationship with his partner is good.  He has multiple sclerosis and feels his problems are related to that.  He tried Viagra, which worked at one point.  Cialis on a PRN basis at 20 mgs has not helped.  He never took it daily, although it was prescribed that way.            Medications : has a current medication list which includes the following prescription(s): zolpidem, diazepam, prednisone, iv catheter, carbamazepine, alfuzosin, tadalafil, dimethyl fumarate, fluticasone-salmeterol, cetirizine, fluticasone, cholecalciferol, ascorbic acid, cyanocobalamin, and proventil hfa.  Allergies :   Allergies   Allergen Reactions    Environmental Allergies     Environmental [Mold]                                                                                               Review of Systems   Constitutional: Negative.    HENT: Negative.    Eyes: Negative.    Respiratory: Negative.    Cardiovascular: Negative.    Gastrointestinal: Negative.    Endocrine: Negative.    Genitourinary: Negative.    Musculoskeletal: Negative.    Skin: Negative.    Allergic/Immunologic:  Negative.    Neurological: Positive for numbness.   Hematological: Negative.    Psychiatric/Behavioral: Negative.        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      HLD (hyperlipidemia)     Multiple sclerosis     Psoriasis     Tic disorder      Surgical History :  has a past surgical history that includes Knee arthroscopy (Right); Incisional hernia repair; hx tonsillectomy/adenoidectomy; Vasectomy; Colonoscopy; hernia repair; and other surgical history.  Family History : family history includes Cancer in his father and maternal grandmother; Heart Disease in his mother. There is no history of Multiple Sclerosis, Lupus, Rheum  arthritis, Thyroid disease, Diabetes, Colon cancer, Colon polyps, or High Blood Pressure.  Social History :  reports that he has never smoked. He has never used smokeless tobacco. He reports that he does not drink alcohol or use illicit drugs.    Physical Exam   Constitutional: He is oriented to person, place, and time. He appears well-developed and well-nourished.   HENT:   Head: Normocephalic and atraumatic.   Right Ear: External ear normal.   Left Ear: External ear normal.   Nose: Nose normal.   Eyes: Conjunctivae and EOM are normal. Pupils are equal, round, and reactive to light.   Neck: Normal range of motion. Neck supple. No JVD present.   Cardiovascular: Normal rate.    Pulmonary/Chest: Effort normal. No respiratory distress. He has no wheezes.   Abdominal: Soft. He exhibits no distension and no mass. There is no tenderness. There is no guarding.   Musculoskeletal: Normal range of motion.   Neurological: He is alert and oriented to person, place, and time.   Skin: Skin is warm and dry.   Psychiatric: He has a normal mood and  affect. His behavior is normal. Judgment and thought content normal.     Penis - Normal, circumcised.  Urethral meatus normal.  Scrotum - normal, without lesions.     No intrascrotal masses appreciated.    No evidence of a spermatocele on either side    No evidence of a hydrocele on either side  Testes:   Right - normal in size, shape, consistency, orientation.  No masses evident.   Left - normal in size, shape, consistency, orientation.  No masses evident.  Vasa:   Right - present and normal   Left - present and normal   Epididymides:   Right - present and normal   Left - present and normal    Prostate:   1+ enlarged and benign feeling     Soft, smooth, symmetric, sharp, distinct borders, without induration or nodularity.  Rectum:   No evidence of mass or blood.  Anal sphincter tone:   Normal  Bulbocavernosus reflex:    Normal    Femoral Pulses:   0-1+ bilaterally     PVR:   19 ccs      Lab Review  Results for GOPAL, MALTER (MRN [id number]) as of 01/25/2017 09:44   Ref. Range 09/08/2010 07:30 11/17/2012 08:22 10/19/2016 08:32   PSA (eff. 01-2009) Latest Ref Range: 0.00 - 4.00 ng/mL 0.58 0.58 0.96           Assessment:    BPH/LUTS    Minimal in extent    Post-void dribbling only    PVR excellent    Gland small    PSA excellent    I'd like to see the impact of a true trial of daily Cialis 5 before considering other interventions    Alfuzosin ineffective    I reviewed the pathophysiology of BPH with the patient.    We reviewed currently available medical and surgical treatment options for BPH.    We discussed the potential consequences of untreated BPH,   including elevated residual urine, UTIs, bladder   stone formation, upper tract compromise, urinary   retention, and renal function compromise.    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  These treatment options currently include:  PDE-5 inhibitors (Viagra, Levitra, Cialis [daily and "as needed"], Staxyn, and Stendra), MUSE, Intracavernosal Injection, VED, and IPP (inflatable penile prostheses).  Sexual counseling was also discussed.    In addition, we reviewed the clinical evaluation of ED, which can help pinpoint the cause of erectile dysfunction in many instances.  However, we also reviewed the fact that, except in rare instances, there is little that can be done to reverse the causes that may be uncovered.  The patient at this point is content with moving ahead with treatment, proceeding from the standpoint of pursuing minimally invasive alternatives first.        Plan:    Check T level   Check UA, culture, cytology   Trial of Cialis 5 daily  Follow Up:   6 weeks    LOS:   99203, 16109  C

## 2017-01-26 ENCOUNTER — Encounter: Payer: Self-pay | Admitting: Urology

## 2017-01-26 ENCOUNTER — Other Ambulatory Visit
Admission: RE | Admit: 2017-01-26 | Discharge: 2017-01-26 | Disposition: A | Discharge status: Home / Self Care | Payer: Medicare (Managed Care) | Source: Ambulatory Visit | Attending: Urology | Admitting: Urology

## 2017-01-26 DIAGNOSIS — N401 Enlarged prostate with lower urinary tract symptoms: Secondary | ICD-10-CM | POA: Insufficient documentation

## 2017-01-26 DIAGNOSIS — N529 Male erectile dysfunction, unspecified: Secondary | ICD-10-CM

## 2017-01-26 DIAGNOSIS — N3943 Post-void dribbling: Secondary | ICD-10-CM

## 2017-01-26 HISTORY — DX: Benign prostatic hyperplasia with lower urinary tract symptoms: N40.1

## 2017-01-26 LAB — URINALYSIS WITH MICROSCOPIC
Blood,UA: NEGATIVE
Ketones, UA: NEGATIVE
Leuk Esterase,UA: NEGATIVE
Nitrite,UA: NEGATIVE
Protein,UA: NEGATIVE mg/dL
RBC,UA: <1 /hpf (ref 0–2)
Specific Gravity,UA: 1.005 (ref 1.002–1.030)
WBC,UA: <1 /hpf (ref 0–5)
pH,UA: 7 (ref 5.0–8.0)

## 2017-01-26 LAB — TESTOSTERONE, FREE, TOTAL
Testosterone,% Free: 1 %
Testosterone,Free: 51 pg/mL (ref 47–244)
Testosterone: 420 ng/dL (ref 193–740)

## 2017-01-26 LAB — SEX HORMONE BINDING GLOBULIN: Sex Hormone Binding Glob: 70 nmol/L (ref 10–80)

## 2017-01-27 ENCOUNTER — Ambulatory Visit
Admission: RE | Admit: 2017-01-27 | Discharge: 2017-01-27 | Disposition: A | Discharge status: Home / Self Care | Payer: Medicare (Managed Care) | Source: Ambulatory Visit | Attending: Neurology | Admitting: Neurology

## 2017-01-27 ENCOUNTER — Other Ambulatory Visit: Payer: Medicare (Managed Care) | Admitting: Radiology

## 2017-01-27 ENCOUNTER — Ambulatory Visit: Payer: Medicare (Managed Care) | Admitting: Radiology

## 2017-01-27 DIAGNOSIS — G35 Multiple sclerosis: Secondary | ICD-10-CM | POA: Insufficient documentation

## 2017-01-27 LAB — POCT CREATININE
Creatinine, POCT: 0.8 mg/dL (ref 0.67–1.17)
GFR,Black POC: 107 *
GFR,Other POC: 92 *

## 2017-01-27 LAB — AEROBIC CULTURE: Aerobic Culture: 0

## 2017-01-27 MED ORDER — GADOBUTROL 1 MMOL/ML (GADAVIST) IV SOLN *WRAPPED*
9.0000 mL | Freq: Once | INTRAVENOUS | Status: AC
Start: 2017-01-27 — End: 2017-01-27
  Administered 2017-01-27: 9 mL via INTRAVENOUS

## 2017-01-28 ENCOUNTER — Encounter: Payer: Self-pay | Admitting: Neurology

## 2017-01-28 ENCOUNTER — Other Ambulatory Visit: Payer: Self-pay | Admitting: Neurology

## 2017-01-28 LAB — MEDICAL CYTOLOGY

## 2017-01-28 MED ORDER — DIMETHYL FUMARATE 240 MG PO CPDR *I*
240.0000 mg | DELAYED_RELEASE_CAPSULE | Freq: Two times a day (BID) | ORAL | 5 refills | Status: DC
Start: 2017-01-28 — End: 2017-07-23

## 2017-02-09 ENCOUNTER — Other Ambulatory Visit: Payer: Self-pay | Admitting: Primary Care

## 2017-02-09 MED ORDER — TADALAFIL 5 MG PO TABS *I*
5.0000 mg | ORAL_TABLET | Freq: Every day | ORAL | 1 refills | Status: DC
Start: 2017-02-09 — End: 2017-06-18

## 2017-02-09 NOTE — Telephone Encounter (Signed)
Last seen Jan 2018

## 2017-02-15 ENCOUNTER — Ambulatory Visit
Admission: AD | Admit: 2017-02-15 | Discharge: 2017-02-15 | Disposition: A | Discharge status: Home / Self Care | Payer: Medicare (Managed Care) | Source: Ambulatory Visit | Attending: Orthopedic Surgery | Admitting: Orthopedic Surgery

## 2017-02-15 DIAGNOSIS — J0100 Acute maxillary sinusitis, unspecified: Secondary | ICD-10-CM

## 2017-02-15 MED ORDER — AMOXICILLIN-POT CLAVULANATE 875-125 MG PO TABS *I*
1.0000 | ORAL_TABLET | Freq: Two times a day (BID) | ORAL | 0 refills | Status: DC
Start: 2017-02-15 — End: 2018-12-24

## 2017-02-15 NOTE — UC Provider Note (Signed)
History     Chief Complaint   Patient presents with    Sinusitis     Cold for the past days, sinus pressure and nasal congestion and a cough for the past few days. Has taken mucinex with some releif.     HPI : 66 year old male presents today with complaints of cold symptoms over the past 4-5 days.  He states that some of them have improved but now he has pressure and pain behind his right eye and into the upper right teeth.  He's had a mild cough as well.  He has been taking Mucinex with some relief.  He denies any fever or chills.  No chest pain or shortness of breath.  No nausea vomiting or diarrhea.    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      HLD (hyperlipidemia)     Multiple sclerosis     Psoriasis     Tic disorder             Past Surgical History:   Procedure Laterality Date    COLONOSCOPY      HERNIA REPAIR      HX TONSILLECTOMY/ADENOIDECTOMY      INCISIONAL HERNIA REPAIR      KNEE ARTHROSCOPY Right     OTHER SURGICAL HISTORY      MS    VASECTOMY         Family History   Problem Relation Age of Onset    Heart Disease Mother      coronary art disease, not premature    Cancer Father      multiple myeloma    Cancer Maternal Grandmother      unknown    Multiple Sclerosis Neg Hx     Lupus Neg Hx     Rheum arthritis Neg Hx     Thyroid disease Neg Hx     Diabetes Neg Hx     Colon cancer Neg Hx     Colon polyps Neg Hx     High Blood Pressure Neg Hx          Social History    reports that he has never smoked. He has never used smokeless tobacco. He reports that he does not drink alcohol, use illicit drugs, or engage in sexual activity.    Living Situation     Questions Responses    Patient lives with Spouse    Homeless No    Caregiver for other family  member No    External Services None    Employment Retired    Domestic Violence Risk No          Review of Systems   Review of Systems   Constitutional: Negative for chills and fever.   HENT: Positive for congestion, postnasal drip, sinus pain and sinus pressure. Negative for ear pain and sore throat.    Eyes: Negative.    Respiratory: Positive for cough. Negative for chest tightness, shortness of breath and wheezing.    Cardiovascular: Negative.    Gastrointestinal: Negative.    Genitourinary: Negative.    Musculoskeletal: Negative.    Skin: Negative.    Neurological: Negative.        Physical Exam     ED Triage  Vitals   BP Heart Rate Heart Rate (via Pulse Ox) Resp Temp Temp src SpO2 (Retired) O2 Device O2 Flow Rate   02/15/17 1102 02/15/17 1102 -- 02/15/17 1102 02/15/17 1102 -- 02/15/17 1102 -- --   131/82 72  18 36.5 C (97.7 F)  98 %        Weight           --                               Physical Exam   Constitutional: He is oriented to person, place, and time. He appears well-developed and well-nourished.   HENT:   Head: Normocephalic and atraumatic.   Right Ear: External ear normal.   Left Ear: External ear normal.   Mouth/Throat: Oropharynx is clear and moist.   Right-sided maxillary sinuses very tender to percussion.  He does feel pressure behind his right eye and into the upper part of his right teeth   Eyes: EOM are normal. Pupils are equal, round, and reactive to light.   Neck: Normal range of motion. Neck supple.   Cardiovascular: Normal rate, regular rhythm and normal heart sounds.    Pulmonary/Chest: Effort normal and breath sounds normal.   Abdominal: Soft. Bowel sounds are normal.   Musculoskeletal: Normal range of motion.   Neurological: He is alert and oriented to person, place, and time.   Skin: Skin is warm and dry.   Nursing note and vitals reviewed.       Medical Decision Making        Initial Evaluation:  ED First Provider Contact     Date/Time Event User Comments    02/15/17 1055 ED First  Provider Contact Leilani Able A Initial Face to Face Provider Contact          Patient was seen on: 02/15/2017        Assessment:  66 y.o.male comes to the [facility] with Acute sinusitis.    Differential Diagnosis includes Sinusitis, viral upper respiratory illness, pharyngitis, otitis media.                Plan: You were seen today for an acute sinus infection.  I did send a prescription to the pharmacy for Augmentin to take twice a day for the next 10 days.  Rest, increase fluids.  Continue your Nettie pot.  In addition you can add Flonase.  Ibuprofen for discomfort.  Continue with Mucinex D.  Follow up with your primary care doctor if your symptoms aren't improving.      Final Diagnosis  Final diagnoses:   [J01.00] Acute maxillary sinusitis, recurrence not specified (Primary)           Leilani Able, PA    Supervising physician Philippa Chester, MD was immediately available     Leilani Able, [place]  02/15/17 1112

## 2017-02-15 NOTE — Discharge Instructions (Signed)
You were seen today for an acute sinus infection.  I did send a prescription to the pharmacy for Augmentin to take twice a day for the next 10 days.  Rest, increase fluids.  Continue your Nettie pot.  In addition you can add Flonase.  Ibuprofen for discomfort.  Continue with Mucinex D.  Follow up with your primary care doctor if your symptoms aren't improving.

## 2017-02-15 NOTE — ED Triage Notes (Signed)
Cold for the past days, sinus pressure and nasal congestion and a cough for the past few days. Has taken mucinex with some releif.    Triage Note   Lennie Odor, RN

## 2017-02-16 ENCOUNTER — Encounter: Payer: Self-pay | Admitting: Neurology

## 2017-02-16 ENCOUNTER — Ambulatory Visit: Payer: Medicare (Managed Care) | Attending: Neurology | Admitting: Neurology

## 2017-02-16 VITALS — BP 126/87 | HR 69 | Ht 70.0 in | Wt 171.0 lb

## 2017-02-16 DIAGNOSIS — G35 Multiple sclerosis: Secondary | ICD-10-CM | POA: Insufficient documentation

## 2017-02-16 NOTE — Comprehensive Assessment (Unsigned)
Manual Dexterity Test    Dominant Hand:   right     Left Hand Average Time:   [phone]     Right Hand Average Time:   [phone]         My Health    Do you still have (18 - Master&apos;s Degree) years of education.:   yes     Are you currently still taking Tecfidera (dimethyl fumarate)?:   yes     Please indicate how many relapses you experienced since your last visit. An MS relapse is generally defined as a new or worsening symptom lasting 24 hours or more in absence of fever or infection. :   1     Since your last visit, have you received intravenous or high-dose oral steroids for your MS?:   yes     When was the last time you received intravenous or high-dose oral steroids for your MS?:   [id number]     What is your primary insurance?:   medicare     What is your employment status?:   full_time     What is your current living situation?:   home_without_assistance     Please read the choices listed below and choose the one that best describes your own situation:   gait_disability         Neuro-QoL    upper_extremity:   [phone]     lower_extremity:   26.712458099833     sleep:   82.50539767341937     fatigue:   41.50109529495239     anxiety:   90.24097353299242     depression:   68.34196222979892     stigma:   [phone]     positive_affect_well_being:   [phone]     cognitive_function:   50.27741287867672     ability_participate_social_roles:   09.47096283662947     satisfaction_social_roles:   65.46503546568127     emotional_behavioral_dyscontrol:   51.70017494496759         Number Processing Speed Test    Total Number Correct:   62         Walking Speed Test    Average Trial Time:   1.638466599357017     AFO Choice:   none     Walking Aid Choice:   none         Contrast Test    Test Canceled:   unable

## 2017-02-16 NOTE — Patient Instructions (Addendum)
1.  Will refer to physical therapy - Cindy Thielman  2.  Will double check with Dr. Ardelia Mems on writing for compounded 4-aminopyridine 10 mg extended release to be taken twice a day.   Dr. Ardelia Mems okay with starting 4-AP.  However, let's wait until after PT and sinusitis clears to see if you see an improvement in LE.  3.  Return in 6 months with Dr. Ardelia Mems.  Let us know if you have questions or concerns before then.

## 2017-02-16 NOTE — Progress Notes (Signed)
Multiple Sclerosis Clinic Follow-up Visit    Subjective:  [name] is a 66 y.o. M here for a regular follow-up of relapsing remitting MS. Accompanied by his wife.  Last seen 12/17/16.  Pt and wife are concerned that LE weakness is progressing.  They ask about MRI results.  Wife says pt has been having more trouble with balance (standing, turning).  Pt says he has more trouble going down stairs.  He denies falls, but says he has to concentrate and hold onto stair rails a little more.  He had 3 days of IVMP, but didn't notice a big difference in his gait (maybe for a day).  He now has a sinus infection and is on abx.    Still gets some spasms in R thigh.  Isn't taking carbamazepine as she didn't notice a difference -- although only took for a few days  Hand/foot tingling is continued  He will be working with a urologist.    [name] use is ongoing without missed doses.    MS History:  Clinical presentations:   1990's R arm weakness   Later numbness hands and feet and stereotyped abnormal distal R thigh sensation  MRIs:   4/08 brain-relatively mild lesion burden, 1 enhancing lesion at the time   5/13 brain-no new lesions   11/14 brain-unchanged   10/16 brain-unchanged   01/27/17 - brain - unchanged   01/27/17 - thoracic spine - unchanged  Other testing:   1/8 LFT wnl   4/17 abs lymph 1.0  Disease modifying treatment hx:   Betaseron started in 1990's, stopped due to injection site reactions/infections   Tecfidera started 5/13 and continued through the present  Symptomatic treatment hx:   ED-Cialis helps   Foot cramps-baclofen can be helpful   Stereotyped R thigh abnormal sensation-will try carbamazepine, baclofen    Past Medical History:   Diagnosis Date    Asthma     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 Never smoker.       Meds:  Current Outpatient Prescriptions   Medication    amoxicillin-clavulanate (AUGMENTIN) 875-125 MG per tablet    tadalafil (CIALIS) 5 MG tablet    dimethyl fumarate (TECFIDERA) 240 mg DR capsule    zolpidem (AMBIEN CR) 12.5 MG CR tablet    diazepam (VALIUM) 5 MG tablet    IV Catheter MISC    carBAMazepine (TEGRETOL XR) 100 MG 12 hr tablet    alfuzosin (UROXATRAL) 10 MG 24 hr tablet    fluticasone-salmeterol (ADVAIR DISKUS) 250-50 MCG/DOSE diskus inhaler    cetirizine (ZYRTEC) 10 MG tablet    fluticasone (FLONASE) 50 MCG/ACT nasal spray    cholecalciferol (VITAMIN D) 1000 UNIT tablet    aqscorbic acid (VITAMIN C) 1000 MG tablet    Cyanocobalamin (VITAMIN B-12  CR PO)    albuterol (PROVENTIL HFA) 108 (90 BASE) MCG/ACT inhaler     No current facility-administered medications for this visit.        ROS:  10 point ROS negative other than per HPI    Vitals  Blood pressure 126/87, pulse 69, height 1.778 m (5\' 10" ), weight 77.6 kg (171 lb).      PE:  Gen: Well appearing, NAD.  MS: Alert. Fluent. Affect appropriate for situation.  CN: Pupils 4 to 3mm without APD.  Choppy pursuits. Full facial strength. Hearing full to soft voice.   Motor: Slight increased spasticity LE (L>R).  (R/L) FE 5/5, HF 5-/4_, ADF 5/5. No pronator drift.  DTRs: knees 1+, bilaterally ankles 0-1 (bilaterally)  Sens:Vibratory sensation at great toes 0-2 on L foot, 2 on R, 2 at L malleolus. Sway with Romberg  Coord: No ataxia on FTN.  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 Radiologically stable. Will continue Tecfidera.  Briefly  discussed that some people benefit from monthly pulse steroids, but since he didn't receive a lot of benefit from recent steroids, wouldn't consider at this time.  Discussed medications such as Ocrevus are approved for progressive disease, but that as Tecfidera was keeping him from getting new lesions, didn't think he needed more aggressive DMT at this timeLE weakness:  Does seem like progressive component to /MS.  Referred to [facility] for PT.  Discussed 4-aminopyridine/Ampyra.    Discussed that this has helped some people with LE weakness.  Although his walk time is <6, LE weakness has started to affect his daily routine. Insurance may not pay for medication, but pt willing to try medication.  Discussed low risk of seizures.  Discussed with Dr. Ardelia [name] who said it might be beneficial for pt.  Will have pt get through sinusitis and PT and if he wants to consider 4-AP at that time, will write script.      1.  Will refer to physical therapy - [name]  2.  Continue Tecfidera with routine blood work to check hepatic function and CBC w/diff  3.  Will have pt go to PT and wait until sinusitis clears to see if LE weakness gets better.  If he still wants to try 4-AP at that time,  will send order  4.  Return in 6 months with Dr. Ardelia [name].  Pt tol et [place] know if he has questions or concerns before then.    Gerald Mulders, NP  Neuroimmunology

## 2017-02-17 ENCOUNTER — Encounter: Payer: Self-pay | Admitting: Neurology

## 2017-02-17 ENCOUNTER — Telehealth: Payer: Self-pay | Admitting: Primary Care

## 2017-02-17 NOTE — Telephone Encounter (Signed)
Treated with 10 day course of augmentin, to follow up prn

## 2017-02-17 NOTE — Telephone Encounter (Signed)
Pt was at urgent care on 02/15/17 for Acute maxillary sinusitis.  Per discharge, pt is to follow-up with Ruby Cola, MD prn.

## 2017-03-01 ENCOUNTER — Ambulatory Visit: Payer: Medicare (Managed Care) | Attending: Neurology | Admitting: Rehabilitative and Restorative Service Providers"

## 2017-03-01 ENCOUNTER — Encounter: Payer: Self-pay | Admitting: Rehabilitative and Restorative Service Providers"

## 2017-03-01 DIAGNOSIS — G35 Multiple sclerosis: Secondary | ICD-10-CM | POA: Insufficient documentation

## 2017-03-01 NOTE — Progress Notes (Signed)
Physical Therapy Exercise Flowsheet:  *Please refer to Physical Therapy Daily Flowsheet for further details of this session.*     03/01/17 1400   Hip Exercises   Hip Abduction, Standing Comment HEP   Hip Extension, Standing, Theraband Comment HEP   Hip Flexion, Standing, Theraband Comment HEP   Runner Step Up Comment xx   Foot/Ankle Exercises   Heel Raises, Standing Comment xx   Heel Walking Comment xx   Toe Raises, Standing Comment xx   Toe Walk Comment xx   Balance Exercises   Sidestepping Comment xx   Standing Balance, Dynamic Comment fwd/bwd weight shifting-HEP   Standing Balance, Static Comment HEP   Tandem Walking Comment xx   Mosie Lukes, PT, DPT, ATC

## 2017-03-01 NOTE — Progress Notes (Signed)
Physical Therapy Daily Flowsheet:  *Please see Physical Therapy Exercise Flowsheet for details regarding exercises completed this session.*     03/01/17 1200   Overview   Diagnosis Multiple Sclerosis   Insurance Medicare Blue Choice   Script Date 02/16/17   Visit # 1   Additional Comments See IE   Pain Assessment   Pain Denies   Functional Outcome Measures   Functional Performance Test Yes   Functional Gait Assessment Yes   Gait Level Surface 2   Change in gait speed 2   Gait with horizontal head turns 1   Gait with vertical head turns 2   Gait and pivot turns 2   Step over obstacle 2   Gait with narrow base of support 0   Gait with eyes closed 1   Ambulating backwards 1   Steps 2   Gait assessment total 15   Patient Education   Patient Education Yes   Educated in disease process Yes   Educated in home exercise program Yes   Additional Patient Education HEP consists of standing hip flexion/ext/abd with L3 TB,fwd/bwd weight shifting (limits of stability), and static balance (FT/EC and modified tandem)   Time Calculation   PT Timed Codes 0 mins   PT Untimed Codes 30 mins   PT Unbilled Time 0 mins   PT Total Treatment 30   Plan and Onset date   Plan of Care Date 03/01/17   Onset Date 02/16/17   Treatment Start Date 03/01/17   Charges   CC Charges PT Eval Low complexity - code 7161   Medicare Functional Reporting   Goal Status Mobility Walking Moving Around /  Mobility GOAL status  [phone]   Medicare Functional Limit Modifiers CI   1 -  < 20% impaired, limited restricted   Current Status Mobility Walking Moving Around / CURRENT status [phone]   Medicare Functional Limit Modifiers CJ  20 - < 40% impaired, limited, restricted   Mosie Lukes, PT, DPT, ATC

## 2017-03-01 NOTE — Progress Notes (Signed)
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      HLD (hyperlipidemia)     Multiple sclerosis     Psoriasis     Tic disorder      Past Surgical History:   Procedure Laterality Date    COLONOSCOPY      HERNIA REPAIR      HX TONSILLECTOMY/ADENOIDECTOMY      INCISIONAL HERNIA REPAIR      KNEE ARTHROSCOPY Right     OTHER SURGICAL HISTORY      MS    VASECTOMY       Referring practitioner: Colin Mulders, NP  Reason for referral: Patient with progressive LE weakness. Has trouble walking down stairs. No falls. Was playing tennis, finding it more difficulty. Balance problems.  Onset date on symptoms/Date of Surgery:   Previous treatments: NA for MS    Type of home: 2 story home with basement (stairs have handrails)   Lives: with spouse   Medical equipment in the home: none  Orthotics: NA  Recreational activities: Tennis, golf      SUBJECTIVE  Pain:     No pain; only numbness in left foot     Noticed weakness in LEs, numbness in feet several months ago (worsen in Feb, relates it to increase stress from tax season. Patient is a IT trainer)  Still playing tennis, fell 1x on last Sunday. Also continues to play golf  Descending stairs is worse than ascending upstairs  No cane   2x falls since February       Current functional limitations:descending stairs and balance  Patient Goals for Therapy: to help with stability    OBJECTIVE  Observation: Patient is a pleasant and cooperative male in NAD.  Cognition: no deficit noted  Vision: Impaired; wearing corrective lenses  Sensation: Numbness/tingling reported in bilateral feet; currently  only in L foot  Coordination: not tested  Tone: not tested      ROM:       R UE: AROM within functional limits   L UE: AROM within functional limits   R LE: AROM within functional limits   L LE: AROM within functional limits    *Indicates pain    Strength:      Muscle group Right  Left    Shoulder Flexion 4 4   Elbow Flexion 4 4   Elbow Extension 4 4   Wrist Flexion 4 4   Wrist Extension 4 4   Hip Flexors   4 4   Hip Extensors     Hip Abductors     Hip Adductors     Knee Extensors  4+ 4+   Knee Flexors  4 4   Ankle Dorsiflexors 4 4   Ankle Plantar flexors      Ankle Inverters      Ankle Evertors     Extensor hallucis longus          Functional assessment:         Bed mobility: Independent      Transfers: Independent with occasional use of bilateral UEs  Ambulation: Patient ambulates independently with slight unsteadiness observed requiring use of counter of wall in standing to maintain balance. Patient demonstrates increased unsteadiness with backward walking, tandem walking, and eyes closed walking requiring contact guard.      Gait deviations: path deviation and unsteady     Stairs: Patient navigates with the use of bilateral UEs with a reciprocal pattern. Attempted without railing with stand by assist, LOB of requiring use of hand railings.      Balance:   Static sitting: Independent, unsupported   Dynamic sitting: Independent, unsupported   Static standing: Independent, unsupported   Dynamic standing: Supervision, unsupported   Feet Together/Eyes Closed: >30 seconds, min sway  Tandem: 9 seconds, LOB  SLS: 6 seconds, LOB    Functional Performance Outcome Measures:    5x sit to stands: 14 seconds without UE assist     FGA: 15 out of 30      Endurance: fair     Patient Education:     Patient Education  Patient Education: Yes  Educated in disease process: Yes  Educated in home exercise program: Yes  Additional Patient Education: HEP consists of standing hip flexion/ext/abd with L3 TB,fwd/bwd weight shifting  (limits of stability), and static balance (FT/EC and modified tandem)      ASSESSMENT  [name] is a 66 y.o. male with a history of Diagnosis: Multiple Sclerosis. He currently presents to outpatient physical therapy with deficits in strength, balance and endurance resulting in difficulty in transfers, ambulation and stair navigation. Skilled physical therapy services indicated to maximize independence with functional mobility and address goals below.     The following comorbidities may affect treatment/recovery: Cardiac history  Multiple sclerosis (MS)  Osteoarthritis (OA) and the following Personal factors may affect treatment/recovery: Advanced age  History of falls.       Clinical presentation:evolving    Patient complexity is low level as indicated by above personal factors, environmental factors and comorbidities in addition to their impairments found on physical exam.      Rehab potential/prognosis: fair  Patient's understanding: good      Short term goals: 4 weeks  HEP/Family Training Goals:   Patient will be independent with basic home exercise routine.  Outcome Measures Goals:   FGA: Patient will achieve a 20/30 on the Functional Gait Assessment in order to limit fall risk.   Balance/Vestibular Goals:   Patient will ambulate with head turns 30 feet without mild path deviation.    Long Term Goals: 8 weeks  HEP/Family Training Goals:   Patient will be independent with comprehensive home exercise routine.  Outcome Measures Goals:   FGA: Patient will achieve a 24/30 on the Functional Gait Assessment in order to limit fall risk.   Balance/Vestibular Goals:   Patient will be able to maintain Sharpened Romberg >= 10 seconds with eyes open without LOB indicating improved standing balance.  Patient will ambulate with head turns 30 feet without LOB indicated.  Functional Mobility Goals:  Gait training:  Patient will be able to ascend/descend steps in a step-to pattern without handrail with No assist  (Independently).  Patient will be able to navigate by stepping over obstacles in pathway of unlevel surfaces with no device and No assist (Independently).        PLAN  Plan of Care: Appropriate for PT  PT interventions: AROM/PROM/Therapeutic exercise, Balance activites, Closed chain activites, Flexibility, Gait training/Functional activities, General conditioning, Home exercise program instruction, Manual therapy, Neuromuscular Re-education, Patient/Family Education, Postural training/body Curator education, Proprioceptive  training, Strengthening, Therapeutic Activities  PT frequency:  Once a week  PT duration: 8 weeks        Goal Status: Mobility Walking Moving Around /  Mobility GOAL status  [phone]           Medicare Functional Limit Modifiers: CI   1 -  < 20% impaired, limited restricted          Current Status: Mobility Walking Moving Around / CURRENT status [id number]           Medicare Functional Limit Modifiers: CJ  20 - < 40% impaired, limited, restricted                                     *Based on clinical judgement, objective measures and subjective reports      Thank you for the referral.  If you have any questions and/or concerns, please feel free to contact me at [phone].              Mosie Lukes, PT, DPT, ATC                              Department of Physical Medicine & Rehabilitation    PLAN OF CARE    Physician:  Colin Mulders, NP    I have reviewed your initial evaluation and agree with the documented goals and Plan of Care      03/01/2017

## 2017-03-04 ENCOUNTER — Other Ambulatory Visit: Payer: Self-pay | Admitting: Primary Care

## 2017-03-05 DIAGNOSIS — Z9109 Other allergy status, other than to drugs and biological substances: Secondary | ICD-10-CM | POA: Insufficient documentation

## 2017-03-05 HISTORY — DX: Other allergy status, other than to drugs and biological substances: Z91.09

## 2017-03-05 NOTE — Telephone Encounter (Signed)
Last filled [id number] as a 15 day supply  Last seen Jan 2018

## 2017-03-06 MED ORDER — ZOLPIDEM TARTRATE 12.5 MG PO TBCR *I*
12.5000 mg | ORAL_TABLET | Freq: Every evening | ORAL | 0 refills | Status: DC | PRN
Start: 2017-03-06 — End: 2017-04-30

## 2017-03-09 ENCOUNTER — Ambulatory Visit: Payer: Medicare (Managed Care) | Admitting: Rehabilitative and Restorative Service Providers"

## 2017-03-09 DIAGNOSIS — G35 Multiple sclerosis: Secondary | ICD-10-CM

## 2017-03-09 NOTE — Progress Notes (Signed)
Physical Therapy Daily Flowsheet:  *Please see Physical Therapy Exercise Flowsheet for details regarding exercises completed this session.*     03/09/17 0700   Overview   Diagnosis Multiple Sclerosis   Insurance Medicare Blue Choice   Script Date 02/16/17   Visit # 2   Additional Comments S:patient reported he played tennis on Sunday and had no difficulty. Patient noted cramping/spasm, reported no stretching throughtout the day.   Pain Assessment   Pain Denies   Patient Education   Patient Education Yes   Educated in disease process Yes   Educated in home exercise program Yes   Additional Patient Education Added in soleus, gastroc, and hamstring stretch and side stepping to HEP   Time Calculation   PT Timed Codes 30 mins   PT Untimed Codes 0 mins   PT Unbilled Time 0 mins   PT Total Treatment 30   Charges   CC Charges Therapeutic Activites - Code 0240 (15 minutes);Therapeutic Exercises - code [phone] (15 minutes)   [name], PT, DPT, ATC

## 2017-03-09 NOTE — Progress Notes (Signed)
Physical Therapy Exercise Flowsheet:  *Please refer to Physical Therapy Daily Flowsheet for further details of this session.*     03/09/17 1400   Hip Exercises   Hamstring Stretch, Stair Comment 2x20 sec hold; b/l    Runner Step Up Comment xx   Foot/Ankle Exercises   Achilles Stretch, Gastroc, Standing Comment on wedge; b/l 2x20 sec   Achilles Stretch, Soleus, Standing Comment on wedge; b/l 2x20 sec   Heel Raises, Standing Comment 1x10; 3 sec hold in // bars with UEs assist   Heel Walking Comment x2 length, tol fairly, unable to maintain thru full gait   Toe Raises, Standing Comment 1x10; 3 sec hold in // bars with UEs assist   Toe Walk Comment x2 lengths of // bars   Balance Exercises   Sidestepping Comment in // bars with L2 TB around ankle; x4 lengths of // bars   Standing Balance, Dynamic Comment Reviewed weight shifting on even surface   Standing Balance, Static Comment on foam; FT/EC, 2x30 seconds; on dynadisc, multiple attempts with eyes open, longest stance ~7 seconds.   Tandem Walking Comment xx   Mosie Lukes, PT, DPT, ATC

## 2017-03-15 ENCOUNTER — Ambulatory Visit: Payer: Medicare (Managed Care) | Attending: Neurology | Admitting: Rehabilitative and Restorative Service Providers"

## 2017-03-15 ENCOUNTER — Encounter: Payer: Self-pay | Admitting: Orthopedic Surgery

## 2017-03-15 ENCOUNTER — Other Ambulatory Visit: Payer: Self-pay | Admitting: Orthopedic Surgery

## 2017-03-15 ENCOUNTER — Ambulatory Visit: Payer: Medicare (Managed Care) | Admitting: Orthopedic Surgery

## 2017-03-15 ENCOUNTER — Ambulatory Visit: Payer: Medicare (Managed Care)

## 2017-03-15 ENCOUNTER — Ambulatory Visit
Admission: RE | Admit: 2017-03-15 | Discharge: 2017-03-15 | Disposition: A | Discharge status: Home / Self Care | Payer: Medicare (Managed Care) | Source: Ambulatory Visit | Attending: Orthopedic Surgery | Admitting: Orthopedic Surgery

## 2017-03-15 ENCOUNTER — Encounter: Payer: Self-pay | Admitting: Primary Care

## 2017-03-15 ENCOUNTER — Encounter: Payer: Self-pay | Admitting: Neurology

## 2017-03-15 VITALS — BP 114/77 | Ht 71.0 in | Wt 170.0 lb

## 2017-03-15 DIAGNOSIS — M25561 Pain in right knee: Secondary | ICD-10-CM | POA: Insufficient documentation

## 2017-03-15 DIAGNOSIS — M1711 Unilateral primary osteoarthritis, right knee: Secondary | ICD-10-CM

## 2017-03-15 DIAGNOSIS — G35 Multiple sclerosis: Secondary | ICD-10-CM | POA: Insufficient documentation

## 2017-03-15 DIAGNOSIS — M170 Bilateral primary osteoarthritis of knee: Secondary | ICD-10-CM | POA: Insufficient documentation

## 2017-03-15 NOTE — Progress Notes (Signed)
Walk-in Visit  [facility]  [phone]    Patient name: [name]   MRN: [id number]      ICD-10-CM ICD-9-CM   1. Primary osteoarthritis of right knee M17.11 715.16       Pain    03/15/17 1724   PainSc:   0 - No pain       The Patient was provided with the following items:  Device  Side: Bilateral  Size:: #3  Model:: Crosstrainers w/ right lateral wedge  Manufacturer: Spenco  Part Number: [id number]  Warranty:: 90 Days  Quantity: 1 pair  Status: Delivered    Functional Goals: Offload  medial  Provide cushion    ROM settings: Free Motion    Expected Frequency / Duration of Use:   Daily    Modifications made: lateral wedge adhered to  right insert    Complexity:   Fitting was routine, requiring little to no adjustments    Yes, Device was inspected for safety/security and found to be functioning properly    Ordered/Delivered: Device Delivered    The patient given verbal instructions.  The following instructions were reviewed: Purpose of device, Cleaning / Care of device, Potential Risks / Benefits, Frequency / Duration of use and How to report potential failure / malfunctions    Laurel Dimmer

## 2017-03-15 NOTE — Progress Notes (Signed)
[phone]

## 2017-03-15 NOTE — Patient Instructions (Signed)
The Patient was provided with the following items:  Device  Side: Bilateral  Size:: #3  Model:: Crosstrainers w/ right lateral wedge  Manufacturer: Spenco  Part Number: [id number]  Warranty:: 90 Days  Quantity: 1 pair  Status: Delivered  [facility]      Your physician has determined that you require Prefabricated (off-the-shelf) Orthotic and Prosthetic (O&P) devices and/or Durable Medical Equipment (DME).  O&P devices include items such as braces for the spine or limbs, while DME includes items such as canes, crutches and walkers.  For your convenience you were fitted by the [place] [facility] of [facility].    Return Policy:    • Prefabricated O&P and DME items are not returnable if used outside of clinic due to hygiene concerns.    • Special or custom orders are not returnable or refundable.    • O&P devices and DME purchased from the  Orthotics and Prosthetics Department may only be exchanged if the item is faulty or poorly fitting, as determined by the O&P Department Clinical Coordinator.  Exchanges will be made using the same type of device, if within 7 days of the purchase date.    • No exchange will be issued for items that were not used in accordance with the manufacturer’s recommended standard of care.    • Replacement or repair of minor parts could become necessary due to wear.  This may include a charge and an order from you physician may be required.  Spenco® PolySorb®  CROSSTRAINER   Replacement Insoles    Recommended For:  Increased shock absorption and heel strike protection.  Reduced friction to help prevent blisters.  Higher energy return for improved athletic performance.  Best For Use In:  Athletic, tennis, basketball and training shoes with removable insoles.  Instructions:  Remove the original insole from the shoe (some brands of shoes glue the insole to the shoe, but  careful removal will not damage the shoe). Use the original insole as a guide to trace and  trim the Spenco® PolySorb® Cross Trainer Replacement Insole.  Care:  Hand wash in warm water. Air dry.

## 2017-03-15 NOTE — Progress Notes (Signed)
Physical Therapy Daily Flowsheet:  *Please see Physical Therapy Exercise Flowsheet for details regarding exercises completed this session.*     03/15/17 0900   Overview   Diagnosis Multiple Sclerosis   Insurance Medicare Blue Choice   Script Date 02/16/17   Visit # 3   Additional Comments S:Patient did not play tennis this weekend, however did a lot of walking (fatigue)   Pain Assessment   Pain Denies   Patient Education   Patient Education Yes   Educated in disease process Yes   Educated in home exercise program Yes   Additional Patient Education Added in tandem walking   Time Calculation   PT Timed Codes 30 mins   PT Untimed Codes 0 mins   PT Unbilled Time 3 mins   PT Total Treatment 30   Charges   CC Charges Therapeutic Activites - Code 0240 (15 minutes);Therapeutic Exercises - code [phone] (15 minutes)   Mosie Lukes, PT, DPT, ATC

## 2017-03-15 NOTE — Progress Notes (Signed)
PATIENTSHAHMIR, [name]  MR #:  [id number]   CSN:  [id number] DOB:  1950-12-08   DICTATED BY:  Raechel Ache, PA DATE OF VISIT:  03/15/2017     CHIEF COMPLAINT:  Right knee pain.    INTERVAL HISTORY:  Patient was last seen here in 2015 for right knee osteoarthritis.  He received viscosupplementation.  He is here today.  He states that he was diagnosed with multiple sclerosis.  He overall is doing well with it.  His knee pain remains in the medial compartment, looking for further suggestions.  Well-preserved motion, no injury, no mechanical symptoms.  No swelling.  No sense of instability.     His past medical history, medications, past surgeries, allergies, social history, family history, and review of systems are otherwise per musculoskeletal questionnaire.    PHYSICAL EXAMINATION:  Shows a polite, cooperative gentleman.  No warmth, erythema.  No effusion.  Well-preserved motion.  Ligamentously stable.  Neurovascularly intact.    IMAGING:  X-rays were taken today and reviewed and compared to 2015.    DIAGNOSIS:  Right knee osteoarthritis.    PLAN:  Patient educated on the findings.  We talked about a shoe wedge, and he will follow up in our nonoperative department for viscosupplementation.  He understands that he will need an appointment for authorization and then can follow up for injections.  Questions are invited and answered to his satisfaction.       Dictated By:  Raechel Ache, PA      ______________________________  Lanae Boast, MD    ML/MODL  DD:  03/15/2017 13:59:17  DT:  03/15/2017 16:39:09  Job #:  [id number]    cc:

## 2017-03-15 NOTE — Progress Notes (Signed)
Physical Therapy Exercise Flowsheet:  *Please refer to Physical Therapy Daily Flowsheet for further details of this session.*     03/15/17 1500   Hip Exercises   Runner Step Up Comment onto 4" fwd/lat step plus blue foam in // bars, 1x10 each way, b/l; demonstrated fair balance (ankle/hip strategy and occasional use of // bars x3)   Foot/Ankle Exercises   Achilles Stretch, Gastroc, Standing Comment on stair; 3x20 sec b/l   Balance Exercises   Sidestepping Comment 6x10 steps each way, L2 TB   Standing Balance, Dynamic Comment balance board, side to side, fwd/bwd weight shifting in // bars, 1x30 rocks each way. Balance board lateral and up/down reaching out of BOS for reaching cone, 1x10 cones each way and each hand.   Tandem Walking Comment x4 lengths in // bars (fwd/bwd); demonstrated fair balance, use of // bars occasionally   additional exercise Reviewed HEP   Mosie Lukes, PT, DPT, ATC

## 2017-03-16 ENCOUNTER — Ambulatory Visit: Payer: Medicare (Managed Care) | Attending: Urology | Admitting: Urology

## 2017-03-16 ENCOUNTER — Encounter: Payer: Self-pay | Admitting: Urology

## 2017-03-16 ENCOUNTER — Encounter: Payer: Self-pay | Admitting: Primary Care

## 2017-03-16 VITALS — BP 136/72 | HR 68

## 2017-03-16 DIAGNOSIS — R339 Retention of urine, unspecified: Secondary | ICD-10-CM

## 2017-03-16 DIAGNOSIS — N401 Enlarged prostate with lower urinary tract symptoms: Secondary | ICD-10-CM

## 2017-03-16 DIAGNOSIS — N529 Male erectile dysfunction, unspecified: Secondary | ICD-10-CM

## 2017-03-16 LAB — POCT BLADDER SCAN PVR

## 2017-03-16 MED ORDER — AVANAFIL 200 MG PO TABS *A*
200.0000 mg | ORAL_TABLET | Freq: Every day | ORAL | 0 refills | Status: DC | PRN
Start: 2017-03-16 — End: 2017-06-18

## 2017-03-16 NOTE — Telephone Encounter (Signed)
FYI

## 2017-03-16 NOTE — Telephone Encounter (Signed)
Seen July 2014 for "nagging cough x 2 months", deemed to be GERD and treated with lansoprazole 30 mg daily.  It was then discontinued by specialist office in Oct 2014.    I called [name] and had to leave message for him to call me back.  Routing to you in the meantime as FYI

## 2017-03-16 NOTE — Progress Notes (Signed)
Visit Diagnosis(es) or CC: BPH, ED    HPI:  The patient comes to the office today in follow up for the above diagnosis (-es).   BPH  The patient began a trial of Cialis 5 daily after his initial visit.  He is bothered still be terminal dribbling.  "It is hard to finish."  He is voiding with a good urinary stream.    He denies hesitancy.    He denies urgency.    He denies intermittency.  He reports no dysuria or hematuria.  He has on average 0 x nocturia.    He is remaining satisfied with his voiding.  He is currently using Cialis 5 only.  He had tried alfuzosin and possibly tamsulosin in the past.   ED  Cialis 5 daily and Cialis 20 PRN have not worked, alone and in combination.  He had tried Viagra a while ago, which was also insufficient.  His T level was excellent.        PMH:    No changes reported          Objective:    GENERAL:  No acute distress.  Well developed, well nourished.  NEUROLOGIC:  Oriented to person, place, time, and situation  PSYCHIATRIC:  Normal mood and affect  HEENT:  NC/AT.  Sclerae anicteric.  Nose/mouth/ears without obvious lesions.  NECK:  Trachea midline.  No JVD in sitting position.    SKIN:  Normal color, turgor, texture, hydration  LUNGS:  Respirations unlabored.    HEART:  Regular rate and rhythm    BACK/ORTHO:  No CVA tenderness.  No obvious back deformities.  No tenderness of the axial skeleton.    ABDOMEN:  Soft, nontender, nondistended, and without masses.  No evidence of bladder distention.  No inguinal hernias.  EXTREMITIES:  No evidence of cyanosis or clubbing.  Edema not evident. Calves non-tender  GENITALIA:   Penis - Normal circumcised penis.  Urethral meatus normal.   Scrotum - normal, without cutaneous lesions evident.    Hydrocele absent    Spermatocele absent   Testes - Right testis normal.  Axis, orientation normal.  Volume normal.    - Left testis normal.  Axis, orientation normal.  Volume normal.   Epididymides - Right normal.                                      -  Left normal.   Vasa - Right normal.                       - Left normal.  PROSTATE:   Deferred      PVR:   12 ccs    19 ccs    LABORATORY DATA (If obtained for this visit):  Results for LANDUN, CZARNY (MRN [id number]) as of 03/16/2017 09:44   Ref. Range 01/26/2017 09:54   Testosterone Latest Ref Range: 193 - 740 ng/dL 983   Testosterone,Free Latest Ref Range: 47 - 244 pg/mL 51   Testosterone,% Free Latest Units: % 1   Sex Hormone Binding Glob Latest Ref Range: 10 - 80 nmol/L 70         ASSESSMENT:   BPH    Isolated complaint of post-void dribbling    Otherwise he has minimal LUTS    We reviewed the changes that occur to the lower urinary tract as men age    We reviewed  that the best treatment for post-void dribbling is remaining      longer at the toilet/urinal after the completion of voiding to ensure      urethral urine has been evacuated    We reviewed other pharmacologic/procedural options    I have advised against both at this point    He is content with observation for now    "It's really not that bad."   ED    Cialis and Viagra have been ineffective    We reviewed other PDE-5 inhibitors    We reviewed a VED, MUSE, ICI therapy, and an IPP    He'd like to stick with pills    "I'm not at that point yet."  PLAN:    BPH    Remain off of medication   ED    Trial of Stendra 200 mgs     I reviewed the mechanism of action, drug interactions, and common side effects of this medication.    If ineffective, he will call the office to arrange a trial of Levitra      FOLLOW-UP:   By phone as above   Here in one year    LOS:      [address], [phone]  C    Time spent in counseling and coordination of care (if used for LOS):  15 minute face-to-face visit with >75% of that spent in counseling and coordination of care.

## 2017-03-17 ENCOUNTER — Encounter: Payer: Self-pay | Admitting: Urology

## 2017-03-18 ENCOUNTER — Encounter: Payer: Self-pay | Admitting: Neurology

## 2017-03-18 ENCOUNTER — Other Ambulatory Visit: Payer: Self-pay | Admitting: Neurology

## 2017-03-18 MED ORDER — DALFAMPRIDINE 10 MG PO TB12 *I*
10.0000 mg | ORAL_TABLET | Freq: Two times a day (BID) | ORAL | 5 refills | Status: DC
Start: 2017-03-18 — End: 2017-09-07

## 2017-03-18 NOTE — Telephone Encounter (Signed)
Script sent form in [employer]

## 2017-03-19 ENCOUNTER — Encounter: Payer: Self-pay | Admitting: Gastroenterology

## 2017-03-22 ENCOUNTER — Ambulatory Visit: Payer: Medicare (Managed Care) | Admitting: Rehabilitative and Restorative Service Providers"

## 2017-03-22 DIAGNOSIS — G35 Multiple sclerosis: Secondary | ICD-10-CM

## 2017-03-22 NOTE — Progress Notes (Signed)
Physical Therapy Daily Flowsheet:  *Please see Physical Therapy Exercise Flowsheet for details regarding exercises completed this session.*     03/22/17 0900   Overview   Diagnosis Multiple Sclerosis   Insurance Medicare Blue Choice   Script Date (sent for script renewal today)   Visit # 4   Additional Comments S:Patient reports no changes with his function. Played tennis twice this weekend and reports no falls   Pain Assessment   Pain Denies   Patient Education   Patient Education Yes   Educated in disease process Yes   Educated in home exercise program Yes   Additional Patient Education added in hip flexor stretch in half kneel and on stairs   Time Calculation   PT Timed Codes 31 mins   PT Untimed Codes 0 mins   PT Unbilled Time 0 mins   PT Total Treatment 31   Charges   CC Charges Therapeutic Activites - Code 0240 (15 minutes x2)   Mosie Lukes, PT, DPT, ATC

## 2017-03-22 NOTE — Progress Notes (Signed)
Physical Therapy Exercise Flowsheet:  *Please refer to Physical Therapy Daily Flowsheet for further details of this session.*     03/22/17 1800   Hip Exercises   Runner Stretch Comment on 3rd step; 3x20 sec hold, b/l   additional exercise half kneel hip flexor stretch; b/l 2x20 sec hold on mat table   Foot/Ankle Exercises   Achilles Stretch, Gastroc, Standing Comment on stair; 3x20 sec b/l   Balance Exercises   Bosu Forward Step-Ups Comment b/l 2x10 in // bars with 3 sec hold at top without UE; tol fairly   Bosu Lateral Step-Ups Comment b/l 2x10 in // bars with 3 sec hold at top without UE; tol fairly   Sidestepping Comment x4 (20 ft) with L3; tol fairly. Monster walks (fwd/bwd) with L3 TB around ankles, x4 lengths (22ft)   Standing Balance, Dynamic Comment balance board, side to side, fwd/bwd weight shifting in // bars, 1x30 rocks each way. Balance board lateral and up/down reaching out of BOS for reaching cone, 2x10 cones each way and each hand.   Mosie Lukes, PT, DPT, ATC

## 2017-03-23 ENCOUNTER — Ambulatory Visit: Payer: Medicare (Managed Care) | Admitting: Orthopedic Surgery

## 2017-03-24 ENCOUNTER — Telehealth: Payer: Self-pay

## 2017-03-24 NOTE — Telephone Encounter (Signed)
Prior auth sent to ins [employer]

## 2017-03-29 ENCOUNTER — Ambulatory Visit: Payer: Medicare (Managed Care) | Admitting: Rehabilitative and Restorative Service Providers"

## 2017-03-29 DIAGNOSIS — G35 Multiple sclerosis: Secondary | ICD-10-CM

## 2017-03-29 NOTE — Progress Notes (Signed)
Physical Therapy Exercise Flowsheet:  *Please refer to Physical Therapy Daily Flowsheet for further details of this session.*     03/29/17 1500   Hip Exercises   Runner Stretch Comment on 3rd step; 3x20 sec hold, b/l   Knee Exercises   Knee Flexion Stretch, Prone Comment prone; 2x15 seconds; tol well   Foot/Ankle Exercises   Achilles Stretch, Gastroc, Standing Comment on stair; 3x20 sec b/l   Balance Exercises   Bosu Forward Step-Ups Comment b/l 2x10 in // bars with 3 sec hold at top without UE; tol fairly   Bosu Lateral Step-Ups Comment b/l lat step (hop-overs) with UEs; tol fairly   Sidestepping Comment x4 (15 steps) with L3 TB around ankle. Monster walks with L3 TB around ankle; fwd/bwd, 4x 15 steps each   Standing Balance, Dynamic Comment BOSU (black top) UE reaches; 3x10 reaches out of BOS   Standing Balance, Static Comment BOSU static balance: 2x30 seconds   [name], PT, DPT, ATC

## 2017-03-29 NOTE — Progress Notes (Signed)
Physical Therapy Daily Flowsheet:  *Please see Physical Therapy Exercise Flowsheet for details regarding exercises completed this session.*     03/29/17 1500   Overview   Diagnosis Multiple Sclerosis   Insurance Medicare Blue Choice   Script Date 03/22/17   Visit # 5   Additional Comments S; patient reported he played tennis yesterday and felt he was moving better and even his wife mentioned to him that he is moving better.   Pain Assessment   Pain Denies   Patient Education   Patient Education Yes   Educated in disease process Yes   Educated in home exercise program Yes   Additional Patient Education Added in BOSU static balance and lat/fwd step up   Time Calculation   PT Timed Codes 30 mins   PT Untimed Codes 0 mins   PT Unbilled Time 0 mins   PT Total Treatment 30   Charges   CC Charges Therapeutic Exercises - code 0204 (15 minutes);Therapeutic Activites - Code 0240 (15 minutes)   Mosie Lukes, PT, DPT, ATC

## 2017-03-30 ENCOUNTER — Encounter: Payer: Self-pay | Admitting: Urology

## 2017-03-31 ENCOUNTER — Other Ambulatory Visit: Payer: Self-pay | Admitting: Urology

## 2017-03-31 DIAGNOSIS — N529 Male erectile dysfunction, unspecified: Secondary | ICD-10-CM

## 2017-03-31 MED ORDER — ALPROSTADIL (VASODILATOR) 1000 MCG UR PLLT *A*
1000.0000 ug | PELLET | Freq: Every day | URETHRAL | 0 refills | Status: DC | PRN
Start: 2017-03-31 — End: 2017-04-21

## 2017-04-05 ENCOUNTER — Ambulatory Visit: Payer: Medicare (Managed Care) | Admitting: Rehabilitative and Restorative Service Providers"

## 2017-04-05 DIAGNOSIS — G35 Multiple sclerosis: Secondary | ICD-10-CM

## 2017-04-05 NOTE — Progress Notes (Signed)
Physical Therapy Exercise Flowsheet:  *Please refer to Physical Therapy Daily Flowsheet for further details of this session.*     04/05/17 1900   Balance Exercises   Standing Balance, Dynamic Comment BOSU (black top) UE reaches; 3x10 reaches out of BOS   Standing Balance, Static Comment BOSU static balance: 2x30 seconds   additional exercise BOSU mini squats; without UE assist, 2x10; demonstrated fair balance   additional exercise re-assessment of balance, strength, and function; see PN   Mosie Lukes, PT, DPT, ATC

## 2017-04-05 NOTE — Progress Notes (Signed)
Physical Therapy Daily Flowsheet:  *Please see Physical Therapy Exercise Flowsheet for details regarding exercises completed this session.*     04/05/17 0900   Overview   Diagnosis Multiple Sclerosis   Insurance Medicare Blue Choice   Script Date 03/22/17   Visit # 6   Additional Comments See PN; will continue PT for a couple more session; however decrease frequency   Pain Assessment   Pain Denies  (reports cramping)   Functional Outcome Measures   Functional Performance Test Yes   Functional Gait Assessment Yes   Gait Level Surface 3   Change in gait speed 3   Gait with horizontal head turns 2   Gait with vertical head turns 3   Gait and pivot turns 3   Step over obstacle 2   Gait with narrow base of support 2   Gait with eyes closed 2   Ambulating backwards 2   Steps 2   Gait assessment total 24   Patient Education   Patient Education Yes   Educated in disease process Yes   Educated in home exercise program Yes   Additional Patient Education Reviewed HEP   Time Calculation   PT Timed Codes 29 mins   PT Untimed Codes 0 mins   PT Unbilled Time 0 mins   PT Total Treatment 29   Plan and Onset date   Plan of Care Date 04/05/17   Onset Date 02/16/17   Treatment Start Date 03/01/17   Charges   CC Charges Therapeutic Exercises - code 0204 (15 minutes);Therapeutic Activites - Code 0240 (15 minutes)   Medicare Functional Reporting   Goal Status Mobility Walking Moving Around /  Mobility GOAL status  [phone]   Medicare Functional Limit Modifiers CI   1 -  < 20% impaired, limited restricted   Current Status Mobility Walking Moving Around / CURRENT status [phone]   Medicare Functional Limit Modifiers CI   1 -  < 20% impaired, limited restricted   Mosie Lukes, PT, DPT, ATC

## 2017-04-05 NOTE — Progress Notes (Signed)
Department of Physical Medicine & Rehabilitation  Physical Therapy Progress Note    HISTORY:  Diagnosis:    Diagnosis: Multiple Sclerosis  Date of Surgery/Onset Date of Symptoms:          Referring Provider:         Colin Mulders, NP  Frequency:    Once a week  Attendance:    Consistent  Patient's compliance with therapy and home exercise program:      good   Treatment:    AROM/PROM/Therapeutic exercise, Balance activities, Closed chain activites, Flexibility, Gait training/Functional activities, General conditioning, Home exercise program instructions/Patient education, Patient/Family Education, Postural training/body Curator education, Best boy, Strengthening, Therapeutic Activities      SUBJECTIVE:  Pain: No pain; only spasms in bilateral LE   NA           OBJECTIVE:  Strength:   Muscle group Right  Left    Shoulder Flexion 4 4   Elbow Flexion 4+ 4+   Elbow Extension 4+ 4+   Wrist Flexion 4 4   Wrist Extension 4 4   Hip Flexors   4 4   Hip Extensors     Hip Abductors     Hip Adductors     Knee Extensors  4+ 4+   Knee Flexors  4 4   Ankle Dorsiflexors 4+ 4+   Ankle Plantar flexors      Ankle Inverters      Ankle Evertors     Extensor hallucis longus          Functional assessment:   Bed mobility: Independent     Transfers: Independent     Ambulation: Patient ambulates independently with slight unsteadiness. Patient demonstrates increased unsteadiness with backward walking, tandem walking, and eyes closed walking requiring stand by assist.       Stairs: Patient navigates with the use of bilateral UEs on handrails with a reciprocal pattern. Attempted without railing, however demonstrated increase unsteadiness.    Balance:                        Static sitting: Independent, unsupported                        Dynamic sitting: Independent, unsupported                        Static standing: Independent, unsupported                        Dynamic standing: Supervision,  unsupported   Feet Together/Eyes Closed: >30 seconds, min sway  Tandem: >30 seconds, min sway  SLS: LLE 12 seconds, LOB, RLE 9 seconds, LOB    Functional Performance Outcome Measures:    5x sit to stands: 11 seconds without UE assist (improved)  FGA: 24 out of 30 (improved)      Endurance: fair         Function:    Improved  Functional Tasks:  Patient reports 20-25% back to normal with overall function and ADLs. He reports he is not able to move quick in tennis as we want. Patient reports his wife and tennis partner have noticed an improvement with his movement; however patient hasn't noticed much improvement. Patient reports of 0 falls throughout his POC.            Previous Goals:    Short term goals: 4 weeks  HEP/Family Training Goals:   Patient will be independent with basic home exercise routine.-achieved  Outcome Measures Goals:   FGA: Patient will achieve a 20/30 on the Functional Gait Assessment in order to limit fall risk. -achieved  Balance/Vestibular Goals:   Patient will ambulate with head turns 30 feet without mild path deviation. -achieved    Long Term Goals: 8 weeks (ongoing and appropriate)  HEP/Family Training Goals:   Patient will be independent with comprehensive home exercise routine.  Outcome Measures Goals:   FGA: Patient will achieve a 24/30 on the Functional Gait Assessment in order to limit fall risk. -achieved  Balance/Vestibular Goals:   Patient will be able to maintain Sharpened Romberg >= 10 seconds with eyes open without LOB indicating improved standing balance.-achieved  Patient will ambulate with head turns 30 feet without LOB indicated.  Functional Mobility Goals:  Gait training:  Patient will be able to ascend/descend steps in a step-to pattern without handrail with No assist (Independently).  Patient will be able to navigate by stepping over obstacles in pathway of unlevel surfaces with no device and No assist (Independently).    ASSESSMENT:  Patient is making good progress  towards his goals. Patient has achieved 3 goals out of 3 of his short term goals. Patient is progressing appropriately towards his other goals. Patient has demonstrated improvements with his static/dynamic balance and has minimal improvements with his strength (mainly maintained his strength). However, continues to demonstrate some deficits with his balance and gait and does not report confidence with his function and expressed his interest of continuing therapy for a couple more sessions to focus on high-functioning balance activities. Patient will continue to benefit from skilled therapy to focus on high-function balance activities to help promote his safety during his leisure activities and on stairs.          Progress Towards Previous Goals:    good       Medicare Functional Reporting for Physical Therapy     Goal Status: Mobility Walking Moving Around /  Mobility GOAL status  [phone]           Medicare Functional Limit Modifiers: CI   1 -  < 20% impaired, limited restricted          Current Status: Mobility Walking Moving Around / CURRENT status [id number]           Medicare Functional Limit Modifiers: CI   1 -  < 20% impaired, limited restricted                        *Based on clinical judgement, objective measures and subjective reports            PLAN:  Plan of Care:     Continue current PT program, progressing as tolerated    Thank you for the referral.  If you have any questions and/or concerns, please feel free to contact me at [phone].    Mosie Lukes, PT, DPT, ATC  Phone # [phone]  Fax # [phone]    Department of Physical Medicine & Rehabilitation    PLAN OF CARE    Physician: Colin Mulders, NP  I have reviewed your progress note and agree with the documented goals and Plan of Care      04/05/2017

## 2017-04-12 ENCOUNTER — Ambulatory Visit: Payer: Medicare (Managed Care) | Attending: Primary Care | Admitting: Rehabilitative and Restorative Service Providers"

## 2017-04-12 DIAGNOSIS — G35 Multiple sclerosis: Secondary | ICD-10-CM | POA: Insufficient documentation

## 2017-04-12 NOTE — Progress Notes (Signed)
Physical Therapy Daily Flowsheet:  *Please see Physical Therapy Exercise Flowsheet for details regarding exercises completed this session.*     04/12/17 0800   Overview   Diagnosis Multiple Sclerosis   Insurance Medicare Blue Choice   Script Date 03/22/17   Visit # 7   Additional Comments S:patient reported he walked 18 holes on thursday and felt okay until the 13th hole where fatigued began. Did not play tennis today due to the heat. At end of session, patient reported he would like to put PT on hold and will call to reschedule appointment if needed.   Pain Assessment   Pain Denies   Patient Education   Patient Education Yes   Educated in disease process Yes   Educated in home exercise program Yes   Time Calculation   PT Timed Codes 31 mins   PT Untimed Codes 0 mins   PT Unbilled Time 0 mins   PT Total Treatment 31   Charges   CC Charges Therapeutic Exercises - code 0204 (15 minutes);Therapeutic Activites - Code 0240 (15 minutes)   [name], PT, DPT, ATC

## 2017-04-12 NOTE — Progress Notes (Signed)
Physical Therapy Exercise Flowsheet:  *Please refer to Physical Therapy Daily Flowsheet for further details of this session.*     04/12/17 1600   Hip Exercises   Runner Stretch Comment on 3rd step; 3x20 sec hold, b/l   additional exercise stationary runner stretch on the ground, throughout the session; hold for 30 sec   Knee Exercises   additional exercise BW squats x10; warm-up before BOSU squats   Balance Exercises   Standing Balance, Dynamic Comment BOSU (black top) ball tosses; 2x10 tosses. BOSU lateral step-overs, 1x20 each way   additional exercise BOSU mini squats; without UE assist, 2x10; demonstrated fair balance   additional exercise side shuffle and fwd/bwd jogging 1x10 for 10 feet each way with supervision   additional exercise fwd/bwd/side shuffle square drill x5   Functional Activities Exercises   additional exercise tennis ball shuffle; reaction time, 2x20 bounces; patient demonstrated good balance and fair movement   Mosie Lukes, PT, DPT, ATC

## 2017-04-13 ENCOUNTER — Encounter: Payer: Self-pay | Admitting: Urology

## 2017-04-13 DIAGNOSIS — N529 Male erectile dysfunction, unspecified: Secondary | ICD-10-CM

## 2017-04-21 ENCOUNTER — Encounter: Payer: Self-pay | Admitting: Sports Medicine

## 2017-04-21 ENCOUNTER — Ambulatory Visit: Payer: Medicare (Managed Care) | Attending: Orthopedic Surgery | Admitting: Sports Medicine

## 2017-04-21 VITALS — BP 111/73 | HR 55 | Ht 71.0 in | Wt 168.0 lb

## 2017-04-21 DIAGNOSIS — M1711 Unilateral primary osteoarthritis, right knee: Secondary | ICD-10-CM

## 2017-04-21 MED ORDER — ALPROSTADIL (VASODILATOR) 1000 MCG UR PLLT *A*
1000.0000 ug | PELLET | Freq: Every day | URETHRAL | 0 refills | Status: DC | PRN
Start: 2017-04-21 — End: 2017-05-12

## 2017-04-21 NOTE — Progress Notes (Signed)
C.C.: Presents for knee pain     HISTORY OF PRESENT ILLNESS    [name] is a 66 y.o. male presents for evaluation and treatment of  right knee pain. This is evaluated as a no injury. The pain began several years ago.  Mechanism of injury : none. Current symptoms include crepitus sensation, stiffness and swelling.The pain is located medial knee.  He describes the symptoms as aching. Symptoms improve with rest, avoiding painful activities. The symptoms are worse with activity, stair climbing, kneeling, deep knee bending. The knee has not given out or felt unstable. The patient can bend and straighten the knee fully.  Denies mechanical symptoms.   Patient reports history of prior problems with this knee, s/p meniscal surgery with Dr. Venia Minks.  Evaluation to date: plain films, which were documented and reviewed as below.     History of meniscal tear with surgery by Dr. Genella Mech & description of previous non surgical right knee treatment:  Activity modification: Y  Physical Therapy:Y (not for knees, for his multiple sclerosis)  Trial of medications: Y  Weight loss: N  Assisted ambulatory device: N  Intra-articular injection: Y - Cortisone in the past, Monovisc in 2015 which lasted for 2 years  Bracing, orthotic devices: No  Contraindications to above treatment: No     Employment:Retired     Patient has previously been seen for this. Consult requested by Sheryle Hail, PA     Patient's medications, allergies, and surgical histories: Per patient questionnaire, reviewed and confirmed.  Past medical history: Per patient questionnaire, reviewed and confirmed.  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      Environmental allergies 05.25.18    HLD  (hyperlipidemia)     Multiple sclerosis     Psoriasis     Tic disorder        Social history: Per patient questionnaire, reviewed and confirmed.   reports that she has never smoked. She does not have any smokeless tobacco history on file..  Family history: Per patient questionnaire, reviewed and confirmed.    Family History   Problem Relation Age of Onset    Heart Disease Mother      coronary art disease, not premature    Cancer Father      multiple myeloma    Cancer Maternal Grandmother      unknown    Multiple Sclerosis Neg Hx     Lupus Neg Hx     Rheum arthritis Neg Hx     Thyroid disease Neg Hx     Diabetes Neg Hx     Colon cancer Neg Hx     Colon polyps Neg Hx     High Blood Pressure Neg Hx      Past Surgical History:   Procedure Laterality Date    COLONOSCOPY      HERNIA REPAIR      HX TONSILLECTOMY/ADENOIDECTOMY      INCISIONAL HERNIA REPAIR      KNEE ARTHROSCOPY Right     OTHER SURGICAL HISTORY      MS    VASECTOMY           Evaluation to date: I personally reviewed patient's X-ray which shows  Right knee findings: Moderate medial joint space narrowing. Mild spurring of the tibial spines.      A radiologist report is available for review.     ROS:  Review of systems:  Per patient questionnaire.   Denies fever, chills, rash, night sweats, unintentional weight loss.  All other systems negative.         OBJECTIVE:   Vitals:    04/21/17 1050   BP: 111/73   Pulse: 55   Weight: 76.2 kg (168 lb)   Height: 1.803 m ('5\' 11"'$ )       General: WDWN and no acute distress male.  Mental Status:  Alert and oriented x3, pleasant and cooperative with exam.  Extremities: No swelling, erythema, ecchymosis or edema.  Gait:  Nonantalgic     Musculoskeletal:   KNEE: No swelling, erythema, ecchymosis or deformity. no palpable effusion or warmth. Tender to palpation: medial joint line.  Range of motion symmetric to opposite side. Neurovascularly intact.  crepitus.  Stable ligamentous testing.      Anterior Drawer:  negative  Posterior Drawer: negative  Lachman's: negative  Valgus stress: slight laxity, firm endpoint   Varus stress: stable  McMurray's: negative       ASSESSMENT / PLAN:  Impression:   66 y.o. male with moderate R knee OA.  After discussion today on non-operative treatment options, patient elected to undergo Monovisc injection as this has provided significant relief in the past.     1. Primary osteoarthritis of right knee         Plan: 1. Primary osteoarthritis of right knee      -We discussed that osteoarthritis is a chronic and usually progressive disease.  It may be caused from injury.  Treatment is patient driven. Simple interventions for arthritis were discussed. I recommended the use of ice to the joint, topical pain relievers, OTC oral pain relievers such ibuprofen (Advil/Motrin), acetaminophen (Tylenol) or naproxen (Alleve) as appropriate given medical comorbidities.  I recommended the use of a brace or sleeve when appropriate, weight loss or maintenance of proper weight, exercise as tolerated and avoidance of painful activities.  We discussed unclear nature of a trial of glucosamine and chondroitin sulfate. I did recommend supervised physical therapy or aqua therapy.  I did recommend steroid (cortisone) or hyaluronic acid viscosupplementation injections.     Will get approval for Monovisc injection.  I did not recommend surgical consultation unless fails to improve.    Questions solicited and answered.     Precautions reviewed.  Medication changes, refills reviewed including directions, side effects and monitoring.    Patient seen and discussed with Dr. Penne Lash, DO  Fellow of Primary Care Sports Medicine  04/21/17     -----------------------------------------------------------------------------------------------------------------------------------------------------    Preceptor Attestation - Patient Seen    I saw and evaluated the patient with the resident/fellow. I have reviewed  the patient intake forms for medications, past medical history, family history and social history. I agree with the resident's/fellow's findings and plan of care as documented above. I have discussed the plan of care with both the resident/fellow and the patient.    Encarnacion Chu, MD, FAAP, CAQ  Assistant professor, Department of Orthopaedics    Primary Care-Internal Medicine/Pediatrics Sports Medicine & Orthopaedics  Non-operative Osteoarthritis/Adult Reconstruction     Faculty, [place]    Team Physician, Walker     [address] [place]   [place], [place] [address]  Office Phone:  [phone]  Email: [email or web address]

## 2017-04-21 NOTE — Patient Instructions (Signed)
Please call my secretary Angela @ [phone] to schedule any of the following injections.    You can also call your insurance and provide them the following codes to determine your cost.     Prior authorization, billing and scheduling can take up to 2-4 weeks.        Monovisc x1  (J7327 + 76942 Ultrasound Fee)                         *Need to go thru Pre-Verification Billing Department first*- Cannot be given same day  Cost is $1158.86 per Knee   *NOT INCLUDING* co-pay for office visit      Orthovisc x3  (J7324)  Some insurances require prior authorization so cannot be given same day, other insurances do allow same day injection  Cost is $224 per Knee each injection, Total for all 3 injections is $672.00  *NOT INCLUDING* co-pay for office visit

## 2017-04-26 ENCOUNTER — Ambulatory Visit: Payer: Medicare (Managed Care) | Admitting: Rehabilitative and Restorative Service Providers"

## 2017-04-30 ENCOUNTER — Other Ambulatory Visit: Payer: Self-pay | Admitting: Primary Care

## 2017-05-07 ENCOUNTER — Ambulatory Visit: Payer: Medicare (Managed Care) | Attending: Primary Care

## 2017-05-07 DIAGNOSIS — Z23 Encounter for immunization: Secondary | ICD-10-CM

## 2017-05-07 NOTE — Progress Notes (Signed)
Patient here for shingrix #2, received in left deltoid and tolerated well.  Series complete.

## 2017-05-10 ENCOUNTER — Encounter: Payer: Self-pay | Admitting: Sports Medicine

## 2017-05-10 ENCOUNTER — Encounter: Payer: Self-pay | Admitting: Urology

## 2017-05-12 ENCOUNTER — Other Ambulatory Visit: Payer: Self-pay | Admitting: Urology

## 2017-05-12 DIAGNOSIS — N529 Male erectile dysfunction, unspecified: Secondary | ICD-10-CM

## 2017-05-13 MED ORDER — ALPROSTADIL (VASODILATOR) 1000 MCG UR PLLT *A*
1000.0000 ug | PELLET | Freq: Every day | URETHRAL | 0 refills | Status: DC | PRN
Start: 2017-05-13 — End: 2017-11-25

## 2017-05-14 ENCOUNTER — Ambulatory Visit: Payer: Medicare (Managed Care) | Admitting: Urology

## 2017-05-19 ENCOUNTER — Encounter: Payer: Self-pay | Admitting: Urology

## 2017-05-19 ENCOUNTER — Telehealth: Payer: Self-pay | Admitting: Urology

## 2017-05-19 NOTE — Telephone Encounter (Signed)
[name], the patient's Pharmacy, is calling to speak with the office regarding the medication [employer] needing a Pre authorization. Please return her call at [phone].

## 2017-05-21 NOTE — Telephone Encounter (Signed)
PA is needed. Pharmacy is faxing over insurance information.

## 2017-05-21 NOTE — Telephone Encounter (Signed)
PA submitted to cover my meds    Your PA has been faxed to the plan as a paper copy. Please contact the plan directly if you haven't received a determination in a typical timeframe.    Key  [id number]

## 2017-06-02 ENCOUNTER — Encounter: Payer: Self-pay | Admitting: Urology

## 2017-06-02 ENCOUNTER — Encounter: Payer: Self-pay | Admitting: Gastroenterology

## 2017-06-07 NOTE — Telephone Encounter (Signed)
Hillary-[employer] is requesting a medical neccesity and other documentation stating that the patient had tried other medications. She states that she is working on the appeal process for the patinet's Muse medication.    Please fax information to [phone].    If necessary she can be reached at [phone].

## 2017-06-07 NOTE — Telephone Encounter (Signed)
Patient has tried Cialis and Viagra. Call back placed to [facility]. Message left for return phone call.

## 2017-06-09 NOTE — Telephone Encounter (Signed)
Hillary from [employer] is calling to check on the status of the records requested on 8/27. She states there are only 2 calendar days left for this appeal. She dan be reached at [phone].

## 2017-06-09 NOTE — Telephone Encounter (Signed)
Additional clinical documentation faxed to [facility] at [facility] at [phone]

## 2017-06-17 ENCOUNTER — Other Ambulatory Visit: Payer: Self-pay | Admitting: Primary Care

## 2017-06-18 NOTE — Telephone Encounter (Signed)
Last filled 7/27  Last seen Jan 2018

## 2017-06-25 NOTE — Telephone Encounter (Signed)
Called pharmacy - Muse is still rejecting as "not covered". Called Hillary at [employer] for an update. Left voice message for her to call back with update and/or fax documentation of denial or approval to [facility].

## 2017-06-30 NOTE — Telephone Encounter (Signed)
MyChart message sent to patient to see if he has heard from [employer] regarding this and how he would like to proceed.

## 2017-07-02 MED ORDER — TADALAFIL 5 MG PO TABS *I*
5.0000 mg | ORAL_TABLET | Freq: Every day | ORAL | 1 refills | Status: DC
Start: 2017-07-02 — End: 2017-12-20

## 2017-07-02 NOTE — Telephone Encounter (Signed)
Insurance denied Muse again. Patient would like a refill of the Cialis 5 mg 90 day supply sent in to his pharmacy. Pended if you agree.   Thanks.

## 2017-07-02 NOTE — Telephone Encounter (Signed)
Done.  Thanks,  Dave

## 2017-07-23 ENCOUNTER — Other Ambulatory Visit: Payer: Self-pay | Admitting: Neurology

## 2017-07-23 MED ORDER — DIMETHYL FUMARATE 240 MG PO CPDR *I*
240.0000 mg | DELAYED_RELEASE_CAPSULE | Freq: Two times a day (BID) | ORAL | 5 refills | Status: DC
Start: 2017-07-23 — End: 2018-01-19

## 2017-08-11 ENCOUNTER — Other Ambulatory Visit: Payer: Self-pay | Admitting: Primary Care

## 2017-08-12 NOTE — Telephone Encounter (Signed)
Last filled 9/8  Last seen jan 2018  [name] please assist with Jan appt, hmp?

## 2017-08-19 ENCOUNTER — Encounter: Payer: Self-pay | Admitting: Gastroenterology

## 2017-08-25 ENCOUNTER — Ambulatory Visit: Payer: Medicare (Managed Care) | Attending: Neurology | Admitting: Neurology

## 2017-08-25 ENCOUNTER — Encounter: Payer: Self-pay | Admitting: Neurology

## 2017-08-25 VITALS — BP 127/80 | HR 60 | Ht 71.0 in | Wt 169.0 lb

## 2017-08-25 DIAGNOSIS — G35 Multiple sclerosis: Secondary | ICD-10-CM | POA: Insufficient documentation

## 2017-08-25 NOTE — Progress Notes (Signed)
[facility]   Follow-up Visit    MS History:  Clinical presentations:   1990's R arm weakness   Later numbness hands and feet and stereotyped abnormal distal R thigh sensation  MRIs:   4/08 brain-relatively mild lesion burden, 1 enhancing lesion at the time   5/13 brain-no new lesions   11/14 brain-unchanged   10/16 brain-unchanged   01/27/17 - brain - unchanged   01/27/17 - thoracic spine - unchanged  Other testing:   1/8 LFT wnl   4/17 abs lymph 1.0  Disease modifying treatment hx:   Betaseron started in 1990's, stopped due to injection site reactions/infections   Tecfidera started 5/13 and continued through the present  Symptomatic treatment hx:   ED-Cialis helps   Foot cramps-baclofen can be helpful   Stereotyped R thigh abnormal sensation-will try carbamazepine, baclofen      Subjective:  [name] is a 66 y.o. M here for a regular follow-up of relapsing remitting MS.   Last seen 02/16/17.  Continues on Tecfidera and tolerates medication.  He started Ampyra after last appt.  He feels it has helped him immensely with LE weakness.  He is able to do more.    He still has to watch himself when he walks down stairs and continues with some balance issues (standing, turning).    Still gets some spasms in R thigh.  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 Never smoker.       Meds:  Current Outpatient Prescriptions   Medication    zolpidem (AMBIEN CR) 12.5 MG CR tablet    dimethyl fumarate (TECFIDERA) 240 mg DR capsule    tadalafil (CIALIS) 5 MG tablet    benzonatate (TESSALON) 200 MG capsule    alprostadil (MUSE) 1000 MCG pellet    dalfampridine (AMPYRA) 10 MG tablet    fluticasone-salmeterol (ADVAIR DISKUS) 250-50 MCG/DOSE diskus inhaler    cetirizine (ZYRTEC) 10 MG tablet    fluticasone (FLONASE) 50 MCG/ACT nasal spray     cholecalciferol (VITAMIN D) 1000 UNIT tablet    aqscorbic acid (VITAMIN C) 1000 MG tablet    Cyanocobalamin (VITAMIN B-12 CR PO)    albuterol (PROVENTIL HFA) 108 (90 BASE) MCG/ACT inhaler     No current facility-administered medications for this visit.        ROS:  10 point ROS negative other than per HPI    Vitals  Blood pressure 127/80, pulse 60, height 1.803 m (5\' 11" ), weight 76.7 kg (169 lb).        PE:  Gen: Well appearing, NAD.  MS: Alert. Fluent. Affect appropriate for situation.  CN: Pupils 4 to 1312mCinciLeonard Sc09New Edinb4833muWest WaLeonard Sc09Dia9890moAlLeonard Sc09Rowl5267maRafael GonLeonard Sc09Ga587mLeonard S477mcDoLeonard Sc09Flush4758miWLeonard Sc09Mountain V2416miEmerald Lake Leonard Sc09Allen P582maCannon Leonard Sc09Bakersfi349meTeLeonard Sc09Dah763mlPLeonard Sc09Notasu1452mlBreLeonard Sc09Middlefi7577meGlen Leonard Sc09Lar6232meMayLeonard Sc09Jeff3335meLeonard Sc09Calvert C22miWooLeonard Sc09Goodr6360miDiamonLeonard Sc09Hubb4145maCornersLeonard Sc09Rocky Po861miGooLeonard Sc09North B4063meCresLeonard Sc09Watkinsvi8270mlBelLeonard Sc09Eagle N7360meEckhart Leonard Sc09Boyert4382moOakLeonard Sc09Okahu8741mLeonard Sc09Pula2msCollings Leonard Sc09Monte Ser2meMarionLeonard Sc09Ple5241mvPaLeonard Sc09Tas2331mhHenrLeonard Sc09Weissport E252maLLeonard Sc09Willoug6070mhOrtonLeonard Sc09West Okob870moAgua Leonard Sc09Blackf48moBerlin HeLeonard Sc09Buff26maNorwood Leonard Sc09Hollis2168mtYoungsLeonard Sc09Wolf257moOgLeonard Sc09Hopk3421miConstaLeonard Sc09Chap4070mmOrderLeonard Sc09[place]7729mdWoLeonard Sc09Rivi648meGLeonard Sc09Terry1gelyn Punt.  Choppy pursuits. Full facial strength. Hearing full to soft voice.   Motor: Slight increased spasticity LE (L>R).   No pronator drift.      Upper extremities  (R/L)   Grip 5/5   Elbow flexion 5/5   Elbow extension 5/5   Shoulder abduction 5/5     Lower extremities  (R/L)   Hip flexion 5/4+   Knee extension 5/5   Knee flexion 5/5   Ankle plantarflexion 5/5   Ankle dorsiflexion 5/5-       DTRs (R/L): biceps 2+/2+ knees 1+,   Sens:Vibratory sensation at great toes 0-2 on L foot,  Sway with Romberg  Coord: No ataxia on FTN.  Gait: Slightly wide based   T25FW (sec): 3.6, last visit 5.16    Assesment and Plan:  66 y.o. male with RRMS with some LE progression on Tecfidera.  Will get updated labs to check CBC w/diff, hepatic function.  Will get repeat MRI of head in April 2019 to check for radiological progression.  He has had increased strength and endurance in walking with  Ampyra.      1.  Continue Tecfidera  2.  Continue Ampyra  3.  Get blood work soon to check liver function and lymphocyte as on Tecfidera  4.  Will get updated MRI of head in April (will need VMP blood work before the MRI to check kidney function)  5.  Return in 6 months with Dr. Ardelia Mems.  Pt to let us know if he has questions or concerns before then        Colin Mulders, NP  Neuroimmunology

## 2017-08-25 NOTE — Patient Instructions (Signed)
1.  Continue Tecfidera  2.  Continue Ampyra  3.  Get blood work soon to check liver function and lymphocyte as on Tecfidera  4.  Will get updated MRI of head in April (will need blood work before that MRI as well)  5.  Return in 6 months with Dr. Ardelia Mems.  Let us know if you have questions or concerns before then

## 2017-08-26 ENCOUNTER — Other Ambulatory Visit
Admission: RE | Admit: 2017-08-26 | Discharge: 2017-08-26 | Disposition: A | Discharge status: Home / Self Care | Payer: Medicare (Managed Care) | Source: Ambulatory Visit | Attending: Neurology | Admitting: Neurology

## 2017-08-26 DIAGNOSIS — G35 Multiple sclerosis: Secondary | ICD-10-CM | POA: Insufficient documentation

## 2017-08-26 LAB — BASIC METABOLIC PANEL
Anion Gap: 9 (ref 7–16)
CO2: 32 mmol/L — ABNORMAL HIGH (ref 20–28)
Calcium: 9.5 mg/dL (ref 8.6–10.2)
Chloride: 100 mmol/L (ref 96–108)
Creatinine: 0.93 mg/dL (ref 0.67–1.17)
GFR,Black: 98 *
GFR,Caucasian: 84 *
Glucose: 76 mg/dL (ref 60–99)
Lab: 16 mg/dL (ref 6–20)
Potassium: 4.1 mmol/L (ref 3.3–5.1)
Sodium: 141 mmol/L (ref 133–145)

## 2017-08-26 LAB — CBC AND DIFFERENTIAL
Baso # K/uL: 0 10*3/uL (ref 0.0–0.1)
Basophil %: 0.6 %
Eos # K/uL: 0.1 10*3/uL (ref 0.0–0.5)
Eosinophil %: 1.7 %
Hematocrit: 42 % (ref 40–51)
Hemoglobin: 14.7 g/dL (ref 13.7–17.5)
IMM Granulocytes #: 0 10*3/uL (ref 0.0–0.1)
IMM Granulocytes: 0.4 %
Lymph # K/uL: 1.1 10*3/uL — ABNORMAL LOW (ref 1.3–3.6)
Lymphocyte %: 21.4 %
MCH: 33 pg/cell — ABNORMAL HIGH (ref 26–32)
MCHC: 35 g/dL (ref 32–37)
MCV: 92 fL (ref 79–92)
Mono # K/uL: 0.7 10*3/uL (ref 0.3–0.8)
Monocyte %: 12.9 %
Neut # K/uL: 3.3 10*3/uL (ref 1.8–5.4)
Nucl RBC # K/uL: 0 10*3/uL (ref 0.0–0.0)
Nucl RBC %: 0 /100 WBC (ref 0.0–0.2)
Platelets: 193 10*3/uL (ref 150–330)
RBC: 4.5 MIL/uL — ABNORMAL LOW (ref 4.6–6.1)
RDW: 12.7 % (ref 11.6–14.4)
Seg Neut %: 63 %
WBC: 5.3 10*3/uL (ref 4.2–9.1)

## 2017-08-26 LAB — HEPATIC FUNCTION PANEL
ALT: 21 U/L (ref 0–50)
AST: 27 U/L (ref 0–50)
Albumin: 4.4 g/dL (ref 3.5–5.2)
Alk Phos: 61 U/L (ref 40–130)
Bilirubin,Direct: <0.2 mg/dL (ref 0.0–0.3)
Bilirubin,Total: 0.8 mg/dL (ref 0.0–1.2)
Total Protein: 6.4 g/dL (ref 6.3–7.7)

## 2017-09-07 ENCOUNTER — Other Ambulatory Visit: Payer: Self-pay | Admitting: Neurology

## 2017-09-07 MED ORDER — DALFAMPRIDINE 10 MG PO TB12 *I*
10.0000 mg | ORAL_TABLET | Freq: Two times a day (BID) | ORAL | 5 refills | Status: DC
Start: 2017-09-07 — End: 2018-03-02

## 2017-09-27 NOTE — Telephone Encounter (Signed)
MyChart note sent to patient, asked him to get appt scheduled.

## 2017-10-13 ENCOUNTER — Telehealth: Payer: Self-pay | Admitting: Primary Care

## 2017-10-13 DIAGNOSIS — Z0000 Encounter for general adult medical examination without abnormal findings: Secondary | ICD-10-CM

## 2017-10-13 DIAGNOSIS — Z125 Encounter for screening for malignant neoplasm of prostate: Secondary | ICD-10-CM

## 2017-10-13 DIAGNOSIS — E785 Hyperlipidemia, unspecified: Secondary | ICD-10-CM

## 2017-10-13 NOTE — Telephone Encounter (Signed)
Patient is scheduled for physical on 11/25/2017.    Please enter any needed labs. Thanks

## 2017-10-14 NOTE — Telephone Encounter (Signed)
Please see pended fasting labs

## 2017-10-29 ENCOUNTER — Other Ambulatory Visit: Payer: Self-pay | Admitting: Primary Care

## 2017-10-29 NOTE — Telephone Encounter (Signed)
Last filled 11/8  Last seen 11/05/2016  Upcoming appt in feb

## 2017-11-15 ENCOUNTER — Other Ambulatory Visit
Admission: RE | Admit: 2017-11-15 | Discharge: 2017-11-15 | Disposition: A | Discharge status: Home / Self Care | Payer: Medicare (Managed Care) | Source: Ambulatory Visit | Attending: Primary Care | Admitting: Primary Care

## 2017-11-15 DIAGNOSIS — Z125 Encounter for screening for malignant neoplasm of prostate: Secondary | ICD-10-CM | POA: Insufficient documentation

## 2017-11-15 DIAGNOSIS — E785 Hyperlipidemia, unspecified: Secondary | ICD-10-CM | POA: Insufficient documentation

## 2017-11-15 DIAGNOSIS — Z0000 Encounter for general adult medical examination without abnormal findings: Secondary | ICD-10-CM | POA: Insufficient documentation

## 2017-11-15 LAB — COMPREHENSIVE METABOLIC PANEL
ALT: 22 U/L (ref 0–50)
AST: 32 U/L (ref 0–50)
Albumin: 4.3 g/dL (ref 3.5–5.2)
Alk Phos: 58 U/L (ref 40–130)
Anion Gap: 10 (ref 7–16)
Bilirubin,Total: 0.7 mg/dL (ref 0.0–1.2)
CO2: 28 mmol/L (ref 20–28)
Calcium: 9.2 mg/dL (ref 8.6–10.2)
Chloride: 106 mmol/L (ref 96–108)
Creatinine: 0.96 mg/dL (ref 0.67–1.17)
GFR,Black: 94 *
GFR,Caucasian: 81 *
Glucose: 88 mg/dL (ref 60–99)
Lab: 14 mg/dL (ref 6–20)
Potassium: 4.6 mmol/L (ref 3.3–5.1)
Sodium: 144 mmol/L (ref 133–145)
Total Protein: 6 g/dL — ABNORMAL LOW (ref 6.3–7.7)

## 2017-11-15 LAB — HEMOGLOBIN A1C: Hemoglobin A1C: 5.1 %

## 2017-11-15 LAB — LIPID PANEL
Chol/HDL Ratio: 3.1
Cholesterol: 185 mg/dL
HDL: 59 mg/dL
LDL Calculated: 117 mg/dL
Non HDL Cholesterol: 126 mg/dL
Triglycerides: 43 mg/dL

## 2017-11-15 LAB — PSA (EFF.4-2010): PSA (eff. 4-2010): 1.01 ng/mL (ref 0.00–4.00)

## 2017-11-15 LAB — TSH: TSH: 2.4 u[IU]/mL (ref 0.27–4.20)

## 2017-11-25 ENCOUNTER — Ambulatory Visit: Payer: Medicare (Managed Care) | Attending: Primary Care | Admitting: Primary Care

## 2017-11-25 ENCOUNTER — Encounter: Payer: Self-pay | Admitting: Primary Care

## 2017-11-25 VITALS — BP 108/62 | HR 69 | Resp 14 | Ht 70.75 in | Wt 166.6 lb

## 2017-11-25 DIAGNOSIS — Z0000 Encounter for general adult medical examination without abnormal findings: Secondary | ICD-10-CM

## 2017-11-25 NOTE — H&P (Signed)
History and Physical    HISTORY:  Chief Complaint   Patient presents with    Annual Exam         History of Present Illness:    HPI  Multiple sclerosis -- relapsing/remitting, tolerating Tecfidera, benefiting from addition of Ampyra (see NOV 2018).  Safety labs, MRI of head scheduled.    GU -- erectile dysfunction, see urology notes.  Noted slowing of stream, tolerable.    Sleep disturbance -- limits zolpidem use to twice weekly to avoid dependence.  Is helpful.    MSK -- R knee DJD, visco-supplementation helpful in past.    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      Environmental allergies 05.25.18    HLD (hyperlipidemia)     Multiple sclerosis     Psoriasis     Tic disorder      Past Surgical History:   Procedure Laterality Date    COLONOSCOPY      HERNIA REPAIR      HX TONSILLECTOMY/ADENOIDECTOMY      INCISIONAL HERNIA REPAIR      KNEE ARTHROSCOPY Right     OTHER SURGICAL HISTORY      MS    VASECTOMY           Allergies:    Allergies   Allergen Reactions    Environmental Allergies     Environmental [Mold]        Current medications:    Current Outpatient Prescriptions   Medication Sig    dalfampridine (AMPYRA) 10 MG tablet Take 1 tablet (10 mg total) by mouth 2 times daily    dimethyl fumarate (TECFIDERA) 240 mg DR capsule Take 1 capsule (240 mg total) by mouth  2 times daily    cholecalciferol (VITAMIN D) 1000 UNIT tablet Take 1,000 Units by mouth daily    aqscorbic acid (VITAMIN C) 1000 MG tablet Take 1,000 mg by mouth daily    Cyanocobalamin (VITAMIN B-12 CR PO) Take 1,000 mcg by mouth daily       zolpidem (AMBIEN CR) 12.5 MG CR tablet TAKE 1 TABLET BY MOUTH NIGHTLY AS NEEDED FOR SLEEP. MAX DAILY DOSE IS 1 TAB    tadalafil (CIALIS) 5 MG tablet Take 1 tablet (5 mg total) by mouth daily    fluticasone-salmeterol (ADVAIR DISKUS) 250-50 MCG/DOSE diskus inhaler Inhale 1 puff into the lungs 2 times daily    cetirizine (ZYRTEC) 10 MG tablet Take 10 mg by mouth daily    fluticasone (FLONASE) 50 MCG/ACT nasal spray 1 spray by Nasal route daily    albuterol (PROVENTIL HFA) 108 (90 BASE) MCG/ACT inhaler INHALE 2 PUFFS EVERY 6 HOURS AS NEEDED       Family History:    Family History   Problem Relation Age of Onset  Heart Disease Mother         coronary art disease, not premature    Cancer Father         multiple myeloma    Cancer Maternal Grandmother         unknown    Multiple Sclerosis Neg Hx     Lupus Neg Hx     Rheum arthritis Neg Hx     Thyroid disease Neg Hx     Diabetes Neg Hx     Colon cancer Neg Hx     Colon polyps Neg Hx     High Blood Pressure Neg Hx        Social/Occupational History:   Social History     Social History    Marital status: Married     Spouse name: N/A    Number of children: N/A    Years of education: N/A     Social History Main Topics    Smoking status: Never Smoker    Smokeless tobacco: Never Used    Alcohol use No      Comment: Very occasionaly    Drug use: No    Sexual activity: No     Other Topics Concern    None     Social History Programmer, 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        Exercise -- doubles tennis or pickle ball up to 5 times weekly, joined [employer] and plans to add some resistance machines.        Diet -- varied, more lean proteins, complex carbs, not a lot of dessert, some greens.   Protein shakes at bkfst and lunch, flax, seeds, yogurt.        Sleep -- active dreams, variable sleep success.  No GU interruption.        Safety -- seatbelt and no distractions, smoke detectors, sunglasses, plans to be more consistent with skin block.         Review of Systems:    Review of Systems   Constitutional: Negative for diaphoresis.   HENT: Negative for nosebleeds.         No change in headache pattern, none intrusive.  Hearing quite functional.   Eyes: Negative for photophobia and pain.   Respiratory: Negative for cough, shortness of breath and wheezing.    Cardiovascular: Negative for chest pain, palpitations, leg swelling and PND.   Gastrointestinal: Negative for abdominal pain, blood in stool, melena, nausea and vomiting.        Heartburn rare, no swallowing difficulties.   Genitourinary: Negative for dysuria, flank pain and hematuria.   Musculoskeletal: Negative for falls.        No intrusive joint or muscle symptoms otherwise.   Skin: Negative for rash.        No active moles noted.   Neurological: Negative for dizziness, tremors and weakness.   Endo/Heme/Allergies: Negative for polydipsia.   Psychiatric/Behavioral: Negative for depression. The patient is not nervous/anxious.        Vital Signs:   BP 108/62    Pulse 69    Resp 14    Ht 1.797 m (5' 10.75")    Wt 75.6 kg (166 lb 9.6 oz)    SpO2 96%    BMI 23.40 kg/m       PHYSICAL EXAM:  Physical Exam   Constitutional: He is oriented to person, place, and time. He appears well-developed and well-nourished. No distress.   HENT:  Head: Atraumatic.   Right Ear: External ear normal.   Left Ear: External ear normal.   Nose: Nose normal.   Mouth/Throat: Oropharynx is clear and moist. No oropharyngeal exudate.   Eyes: Pupils are equal, round, and reactive to light. Conjunctivae and EOM are normal. No scleral icterus.   Neck: Neck supple. No JVD present. No tracheal deviation present. No thyromegaly present.   Cardiovascular: Normal rate, regular rhythm,  normal heart sounds and intact distal pulses.    No murmur heard.  Pulmonary/Chest: Effort normal and breath sounds normal. No respiratory distress. He has no wheezes. He has no rales.   Abdominal: Soft. Bowel sounds are normal. He exhibits no distension and no mass. There is no hepatosplenomegaly. There is no tenderness.   Musculoskeletal: Normal range of motion. He exhibits no edema or tenderness.   Lymphadenopathy:     He has no cervical adenopathy.     He has no axillary adenopathy.        Right: No supraclavicular adenopathy present.        Left: No supraclavicular adenopathy present.   Neurological: He is alert and oriented to person, place, and time. No cranial nerve deficit. Coordination normal.   Skin: Skin is dry. No rash noted. He is not diaphoretic. No erythema.   No suspicious nevi.   Psychiatric: He has a normal mood and affect. His behavior is normal.       Component      Latest Ref Rng & Units 11/15/2017 08/26/2017 10/19/2016   WBC      4.2 - 9.1 THOU/uL  5.3    RBC      4.6 - 6.1 MIL/uL  4.5 (L)    Hemoglobin      13.7 - 17.5 g/dL  14.7    Hematocrit      40 - 51 %  42    MCV      79 - 92 fL  92    MCH      26 - 32 pg/cell  33 (H)    MCHC      32 - 37 g/dL  35    RDW      11.6 - 14.4 %  12.7    Platelets      150 - 330 THOU/uL  193    Seg Neut %      %  63.0    Lymphocyte %      %  21.4    Monocyte %      %  12.9    Eosinophil %      %  1.7    Basophil %      %  0.6    Neut # K/uL      1.8 - 5.4 THOU/uL  3.3    Lymph # K/uL      1.3 - 3.6 THOU/uL  1.1 (L)    Mono # K/uL      0.3 - 0.8 THOU/uL  0.7    Eos # K/uL      0.0 - 0.5 THOU/uL  0.1    Baso # K/uL      0.0 - 0.1 THOU/uL  0.0    Nucl RBC %      0.0 - 0.2 /100 WBC  0.0    Nucl RBC # K/uL      0.0 - 0.0 THOU/uL  0.0    IMM Granulocytes #      0.0 - 0.1 THOU/uL  0.0    IMM Granulocytes      %  0.4    Sodium      133 - 145 mmol/L 144 141 143   Potassium      3.3 - 5.1 mmol/L 4.6 4.1 3.9   Chloride      96 - 108 mmol/L 106 100 102   CO2      20 - 28  mmol/L 28 32 (H) 30 (H)   Anion Gap      7 - '16 10 9 11   '$ UN      6 - 20 mg/dL '14 16 13   '$ Creatinine      0.67 - 1.17 mg/dL 0.96 0.93 0.94   GFR,Caucasian      * 81 84 84   GFR,Black      * 94 98 97   Glucose      60 - 99 mg/dL 88 76 94   Calcium      8.6 - 10.2 mg/dL 9.2 9.5 9.2   Total Protein      6.3 - 7.7 g/dL 6.0 (L) 6.4 6.3   Albumin      3.5 - 5.2 g/dL 4.3 4.4 4.1   Bilirubin,Total      0.0 - 1.2 mg/dL 0.7 0.8 0.8   AST      0 - 50 U/L 32 27 34   ALT      0 - 50 U/L '22 21 25   '$ Alk Phos      40 - 130 U/L 58 61 65   Bilirubin,Direct      0.0 - 0.3 mg/dL  <0.2    Cholesterol      mg/dL 185  185   Triglycerides      mg/dL 43  94   HDL Cholesterol      mg/dL 59  52   LDL Calculated      mg/dL 117  114   Non HDL Cholesterol      mg/dL 126  133   Chol/HDL Ratio       3.1  3.6   TSH      0.27 - 4.20 uIU/mL 2.40  3.35   Hemoglobin A1C      % 5.1  4.7   PSA (eff. 01-2009)      0.00 - 4.00 ng/mL 1.01  0.96         Assessment:       Neurology, urology, orthopedic f/u as above.  No medication changes today.  Favorable CV risk profile, very good habits, and prefers to avoid additional pharma for primary prevention.  Discussed CA screening options, immunization schedule.   .      Plan:        All good, maintain status quo.  Annual physical.

## 2017-12-20 ENCOUNTER — Other Ambulatory Visit: Payer: Self-pay | Admitting: Primary Care

## 2017-12-20 NOTE — Telephone Encounter (Signed)
Dr Ron Agee had most recently been filling this

## 2017-12-24 ENCOUNTER — Other Ambulatory Visit: Payer: Self-pay | Admitting: Urology

## 2017-12-27 MED ORDER — TADALAFIL 5 MG PO TABS *I*
5.0000 mg | ORAL_TABLET | Freq: Every day | ORAL | 3 refills | Status: DC
Start: 2017-12-27 — End: 2018-08-05

## 2018-01-03 ENCOUNTER — Telehealth: Payer: Self-pay | Admitting: Primary Care

## 2018-01-03 NOTE — Telephone Encounter (Signed)
RE: Rx Prior Auth   Received: 01/03/2018   Message Contents   Dunning, Benard [place], [place]            [place]   Dates are 12/04/17 to 01/03/19   [place]

## 2018-01-03 NOTE — Telephone Encounter (Signed)
-----   Message from Amparo Bristol Dunning sent at 01/03/2018  9:35 AM EDT -----  Regarding: RE: Rx Prior Auth  Sent!  Blake Divine  ----- Message -----  From: Sandria Senter  Sent: 12/31/2017  10:34 AM  To: [facility] Prior Auth Requests  Subject: Rx Prior Auth                                    Fax request from CoverMyMeds:    Key: [id number]  Medication: Tadalafil 5 mg tablets  Sig: Take 1 tablet (5 mg total) by mouth daily - Oral  Insurance: [employer]

## 2018-01-16 ENCOUNTER — Other Ambulatory Visit: Payer: Self-pay | Admitting: Primary Care

## 2018-01-17 NOTE — Telephone Encounter (Signed)
11/25/2017 last seen  Next appt 11/25/18

## 2018-01-19 ENCOUNTER — Other Ambulatory Visit: Payer: Self-pay | Admitting: Neurology

## 2018-01-19 MED ORDER — DIMETHYL FUMARATE 240 MG PO CPDR *I*
240.0000 mg | DELAYED_RELEASE_CAPSULE | Freq: Two times a day (BID) | ORAL | 5 refills | Status: DC
Start: 2018-01-19 — End: 2018-08-05

## 2018-03-02 ENCOUNTER — Other Ambulatory Visit: Payer: Self-pay | Admitting: Neurology

## 2018-03-03 MED ORDER — DALFAMPRIDINE 10 MG PO TB12 *I*
10.0000 mg | ORAL_TABLET | Freq: Two times a day (BID) | ORAL | 5 refills | Status: DC
Start: 2018-03-03 — End: 2018-09-23

## 2018-04-20 ENCOUNTER — Encounter: Payer: Self-pay | Admitting: Urology

## 2018-04-26 ENCOUNTER — Ambulatory Visit: Payer: Medicare (Managed Care) | Attending: Urology | Admitting: Urology

## 2018-04-26 ENCOUNTER — Encounter: Payer: Self-pay | Admitting: Urology

## 2018-04-26 VITALS — BP 123/77 | HR 50 | Ht 70.75 in | Wt 166.7 lb

## 2018-04-26 DIAGNOSIS — N529 Male erectile dysfunction, unspecified: Secondary | ICD-10-CM

## 2018-04-26 DIAGNOSIS — R339 Retention of urine, unspecified: Secondary | ICD-10-CM

## 2018-04-26 DIAGNOSIS — N401 Enlarged prostate with lower urinary tract symptoms: Secondary | ICD-10-CM

## 2018-04-26 LAB — POCT BLADDER SCAN PVR: Residual mL: 77

## 2018-04-26 MED ORDER — TADALAFIL 20 MG PO TABS *I*
ORAL_TABLET | ORAL | 12 refills | Status: DC
Start: 2018-04-26 — End: 2018-08-05

## 2018-04-26 NOTE — Progress Notes (Signed)
Visit Diagnosis(es) or CC: BPH, ED    HPI:  The patient comes to the office today in follow up for the above diagnosis (-es).   BPH  He is voiding with a good urinary stream.    He has stable post-void dribbling.    He denies hesitancy.    He denies urgency.    He denies unusual diurnal urinary frequency.  He denies intermittency.  He reports no dysuria or hematuria.  He has on average 0 x nocturia.    He is remaining satisfied with his voiding.  He is currently using Cialis 5 once daily only.  He had tried alfuzosin and possibly tamsulosin in the past.   ED  He is using Cialis 5 daily only but not Cialis 20 PRN.  He now states that worked in the past.  Cialis 5 alone is not sufficient.  MUSE is not covered.  He had tried Viagra a while ago, which was insufficient.  His T level was excellent.        PMH:    No changes reported    PSH:  No changes reported    FH:  No changes reported    SH:  No changes reported    ROS:      Systemic: Denies recent weight loss, weight gain, or fatigue.  Eyes: Denies change in vision.  ENT: Denies hearing problems, or loss. Denies swollen glands or stiff neck.  Chest: Denies recent cold, flu or upper respiratory illness. Denies cough or dyspnea.  Heart: Denies recent "heart trouble," chest pain, or palpitations.  GI: Denies constipation, diarrhea, acid reflux or bloody stool.  GU: Denies urogenital complaints, except as outlined in the HPI.  Musculoskeletal: Denies any muscle or joint pain, stiffness, or progressive arthritis.         Objective:    GENERAL:  No acute distress.  Well developed, well nourished.  NEUROLOGIC:  Oriented to person, place, time, and situation  PSYCHIATRIC:  Normal mood and affect  HEENT:  NC/AT.  Sclerae anicteric.  Nose/mouth/ears without obvious lesions.  NECK:  Trachea midline.  No JVD in sitting position.    SKIN:  Normal color, turgor, texture, hydration  LUNGS:  Respirations unlabored.    HEART:  Regular rate and rhythm  BACK:  No CVAT  ABD:  Soft,  NT/ND, no masses or bladder distention  EXTR:  No calf swelling or tenderness  GENITALIA:  Penis - Normal, circumcised.  Urethral meatus normal.  Scrotum - normal, without lesions.     No intrascrotal masses appreciated.    No evidence of a spermatocele on either side    No evidence of a hydrocele on either side  Testes:   Right - normal in size, shape, consistency, orientation.  No masses evident.   Left - normal in size, shape, consistency, orientation.  No masses evident.  Vasa:   Right - present and normal   Left - present and normal   Epididymides:   Right - present and normal   Left - present and normal    Prostate:   1+ enlarged and benign feeling - unchanged.      Soft, smooth, symmetric, sharp, distinct borders, without induration or nodularity.  Rectum:   No evidence of mass or blood.  Anal sphincter tone:   Normal  Bulbocavernosus reflex:    Normal    Femoral Pulses:   0-1+ bilaterally      PVR:   77 ccs (Last void was at 0715, or  4 hours ago)    CF 12 ccs    19 ccs    LABORATORY DATA (If obtained for this visit):  Results for CLEBERT, WENGER (MRN [id number]) as of 04/26/2018 11:02   Ref. Range 09/08/2010 07:30 11/17/2012 08:22 10/19/2016 08:32 11/15/2017 08:46   PSA (eff. 01-2009) Latest Ref Range: 0.00 - 4.00 ng/mL 0.58 0.58 0.96 1.01       Results for BAKER, MORONTA (MRN [id number]) as of 03/16/2017 09:44   Ref. Range 01/26/2017 09:54   Testosterone Latest Ref Range: 193 - 740 ng/dL 425   Testosterone,Free Latest Ref Range: 47 - 244 pg/mL 51   Testosterone,% Free Latest Units: % 1   Sex Hormone Binding Glob Latest Ref Range: 10 - 80 nmol/L 70         ASSESSMENT:   BPH    Well compensated at present with once daily Cialis 5 only    He does not feel he needs an alpha blocker    LUTS minimal    PVR likely much better than reflected in today's PVR    Gland size good    He is content     ED    Cialis 5 daily not sufficient    Insurance coverage for other ED treatment options that are not pills problematic    I have suggested  adding PRN Cialis 20  PLAN:    BPH    Continue Cialis 5 daily   ED    Continue Cialis 5 daily    Add PRN Cialis 20      FOLLOW-UP:   Here in one year    LOS:      [address]213, [address]  CY    Time spent in counseling and coordination of care (if used for LOS):  15 minute face-to-face visit with >75% of that spent in counseling and coordination of care.

## 2018-07-27 ENCOUNTER — Ambulatory Visit: Payer: Medicare (Managed Care) | Attending: Orthopedic Surgery | Admitting: Orthopedic Surgery

## 2018-07-27 ENCOUNTER — Encounter: Payer: Self-pay | Admitting: Sports Medicine

## 2018-07-27 ENCOUNTER — Encounter: Payer: Self-pay | Admitting: Orthopedic Surgery

## 2018-07-27 VITALS — BP 128/67 | HR 69 | Ht 71.0 in | Wt 165.0 lb

## 2018-07-27 DIAGNOSIS — M1711 Unilateral primary osteoarthritis, right knee: Secondary | ICD-10-CM

## 2018-07-27 NOTE — Progress Notes (Signed)
[phone]

## 2018-07-27 NOTE — Progress Notes (Signed)
[name], [name]  MR #:  [id number]   CSN:  [id number] DOB:  Jul 07, 1951   DICTATED BY:  Gerald Panther, PA DATE OF VISIT:  07/27/2018     CHIEF COMPLAINT:  Followup right knee.    INTERVAL HISTORY:  Patient was last seen here last year.  He has medial based OA, status post previous partial medial meniscectomy.  He also has MS.  He has constant medial based pain.  When I last saw him, we referred him to non-op for viscosupplementation.  The patient states he never followed through with this.  No mechanical symptoms.  He is slightly tender to touch.  No new injury.  He uses occasional Aleve.  Still playing tennis and pickle ball.  He also has some new medial hip pain, for which he has seen Dr. Otilio [name] in the new year.  Here today for further evaluation of his knee.    PHYSICAL EXAMINATION:  Shows a polite, cooperative gentleman.  No warmth or erythema.  Minimal pain to palpation.  Well-preserved motion.  Ligamentously stable.  He is neurovascularly intact.    ASSESSMENT AND PLAN:  We reviewed osteoarthritis with no mechanical symptoms.  I would not suggest any surgical intervention at this time.  He states he does have the shoe wedge; he will be were consistent with that, as well as a copper sleeve.  He is not interested in cortisone.  We did talk about [place] and his hip, and if interested, will follow up with our non-op department for viscosupplementation as suggested last year.  Questions otherwise invited and answered to his satisfaction.       Dictated By:  Gerald Panther, PA      ______________________________  Gerald Boast, MD    MH/MODL  DD:  07/27/2018 13:50:12  DT:  07/27/2018 14:03:38  Job #:  [id number]    cc:

## 2018-07-28 ENCOUNTER — Encounter: Payer: Self-pay | Admitting: Sports Medicine

## 2018-08-02 ENCOUNTER — Telehealth: Payer: Self-pay | Admitting: Sports Medicine

## 2018-08-02 ENCOUNTER — Encounter: Payer: Self-pay | Admitting: Sports Medicine

## 2018-08-02 NOTE — Telephone Encounter (Addendum)
10/22   MBC  AE 02/10/15  B -80%  Est Cost - 791.24  To Col - 158.24      ----- Message from Martina Sinner sent at 08/02/2018  8:01 AM EDT -----  Regarding: FW: 07/29/18 - MONOVISC - [id number]  Hello!     NAR per MBC guidelines. Can B&B.     Thank you!   Caycee  ----- Message -----  From: Jeanie Sewer  Sent: 07/29/2018   1:24 PM EDT  To: [facility] Prior Auth Team  Subject: 07/29/18 - MONOVISC - [id number]            DX:  RIGHT KNEE - M17.11    CPT:  M8413, 20611    INSURANCE:  MEDICARE BLUE CHOICE     Plan: MEDICARE BLUE CHOICE Group: [id number] Member: [id number]  Effective from: 02/10/2015 Subscriber: Jearld Pies Subscriber ID: [id number]  Guarantor: Jearld Pies

## 2018-08-02 NOTE — Telephone Encounter (Signed)
Pt has paid deposit, good to go for injection

## 2018-08-05 ENCOUNTER — Encounter: Payer: Self-pay | Admitting: Orthopedic Surgery

## 2018-08-05 ENCOUNTER — Other Ambulatory Visit: Payer: Self-pay | Admitting: Neurology

## 2018-08-05 ENCOUNTER — Other Ambulatory Visit: Payer: Self-pay | Admitting: Urology

## 2018-08-05 DIAGNOSIS — G35 Multiple sclerosis: Secondary | ICD-10-CM

## 2018-08-05 MED ORDER — DIMETHYL FUMARATE 240 MG PO CPDR *I*
240.0000 mg | DELAYED_RELEASE_CAPSULE | Freq: Two times a day (BID) | ORAL | 1 refills | Status: DC
Start: 2018-08-05 — End: 2018-09-27

## 2018-08-05 NOTE — Telephone Encounter (Signed)
Refill request

## 2018-08-08 ENCOUNTER — Encounter: Payer: Self-pay | Admitting: Gastroenterology

## 2018-08-10 NOTE — Telephone Encounter (Signed)
Left message for return call to schedule. If pt calls back, please relay message regarding labs also

## 2018-08-11 ENCOUNTER — Other Ambulatory Visit
Admission: RE | Admit: 2018-08-11 | Discharge: 2018-08-11 | Disposition: A | Discharge status: Home / Self Care | Payer: Medicare (Managed Care) | Source: Ambulatory Visit | Attending: Neurology | Admitting: Neurology

## 2018-08-11 DIAGNOSIS — G35 Multiple sclerosis: Secondary | ICD-10-CM | POA: Insufficient documentation

## 2018-08-11 LAB — CBC AND DIFFERENTIAL
Baso # K/uL: 0 10*3/uL (ref 0.0–0.1)
Basophil %: 0.6 %
Eos # K/uL: 0.1 10*3/uL (ref 0.0–0.5)
Eosinophil %: 2.3 %
Hematocrit: 42 % (ref 40–51)
Hemoglobin: 14.4 g/dL (ref 13.7–17.5)
IMM Granulocytes #: 0 10*3/uL
IMM Granulocytes: 0.6 %
Lymph # K/uL: 1.1 10*3/uL — ABNORMAL LOW (ref 1.3–3.6)
Lymphocyte %: 20.4 %
MCH: 31 pg/cell (ref 26–32)
MCHC: 35 g/dL (ref 32–37)
MCV: 90 fL (ref 79–92)
Mono # K/uL: 0.6 10*3/uL (ref 0.3–0.8)
Monocyte %: 11.4 %
Neut # K/uL: 3.4 10*3/uL (ref 1.8–5.4)
Nucl RBC # K/uL: 0 10*3/uL (ref 0.0–0.0)
Nucl RBC %: 0 /100 WBC (ref 0.0–0.2)
Platelets: 187 10*3/uL (ref 150–330)
RBC: 4.6 MIL/uL (ref 4.6–6.1)
RDW: 12.8 % (ref 11.6–14.4)
Seg Neut %: 64.7 %
WBC: 5.2 10*3/uL (ref 4.2–9.1)

## 2018-08-11 LAB — HEPATIC FUNCTION PANEL
ALT: 26 U/L (ref 0–50)
AST: 31 U/L (ref 0–50)
Albumin: 4.2 g/dL (ref 3.5–5.2)
Alk Phos: 64 U/L (ref 40–130)
Bilirubin,Direct: <0.2 mg/dL (ref 0.0–0.3)
Bilirubin,Total: 0.7 mg/dL (ref 0.0–1.2)
Total Protein: 6.3 g/dL (ref 6.3–7.7)

## 2018-08-12 NOTE — Telephone Encounter (Signed)
2nd attempt to reach pt

## 2018-08-18 ENCOUNTER — Encounter: Payer: Self-pay | Admitting: Sports Medicine

## 2018-08-19 ENCOUNTER — Other Ambulatory Visit: Payer: Self-pay | Admitting: Gastroenterology

## 2018-08-22 ENCOUNTER — Encounter: Payer: Self-pay | Admitting: Gastroenterology

## 2018-08-25 ENCOUNTER — Ambulatory Visit: Payer: Medicare (Managed Care) | Admitting: Sports Medicine

## 2018-09-13 ENCOUNTER — Ambulatory Visit
Admission: RE | Admit: 2018-09-13 | Discharge: 2018-09-13 | Disposition: A | Discharge status: Home / Self Care | Payer: Medicare (Managed Care) | Source: Ambulatory Visit | Attending: Sports Medicine | Admitting: Sports Medicine

## 2018-09-13 ENCOUNTER — Encounter: Payer: Self-pay | Admitting: Sports Medicine

## 2018-09-13 ENCOUNTER — Ambulatory Visit: Payer: Medicare (Managed Care) | Admitting: Sports Medicine

## 2018-09-13 VITALS — BP 110/80 | Ht 71.0 in | Wt 165.0 lb

## 2018-09-13 DIAGNOSIS — M1711 Unilateral primary osteoarthritis, right knee: Secondary | ICD-10-CM | POA: Insufficient documentation

## 2018-09-13 MED ORDER — HYALURONAN 88 MG/4ML IX SOSY *I*
88.0000 mg | PREFILLED_SYRINGE | Freq: Once | INTRA_ARTICULAR | Status: AC | PRN
Start: 2018-09-13 — End: 2018-09-13
  Administered 2018-09-13: 88 mg via INTRA_ARTICULAR

## 2018-09-13 MED ORDER — LIDOCAINE HCL 1 % IJ SOLN *I*
1.0000 mL | Freq: Once | INTRAMUSCULAR | Status: AC | PRN
Start: 2018-09-13 — End: 2018-09-13
  Administered 2018-09-13: 1 mL via INTRA_ARTICULAR

## 2018-09-13 MED ORDER — LIDOCAINE HCL 1 % IJ SOLN *I*
1.5000 mL | Freq: Once | INTRAMUSCULAR | Status: AC | PRN
Start: 2018-09-13 — End: 2018-09-13
  Administered 2018-09-13: 1.5 mL via INTRA_ARTICULAR

## 2018-09-13 MED ORDER — ETHYL CHLORIDE EX AERO (MULTIPLE PATIENTS) *I*
1.0000 | INHALATION_SPRAY | Freq: Once | CUTANEOUS | Status: AC | PRN
Start: 2018-09-13 — End: 2018-09-13
  Administered 2018-09-13: 1 via TOPICAL

## 2018-09-13 NOTE — Patient Instructions (Signed)
Injection Instructions    · The injection you received today may take 5-7 days to work in the case of cortisone or 3 weeks or more for hyaluronic acid viscosupplements (Synvisc, Hyalgan, Orthovisc or others).   · You may find the area that was injected to be more painful for 1 or 2 days after the injection, after the numbing medicine wears off.  This happens as the medicine is being absorbed in your body.  · Rest for 2-3 days, activities of daily living are fine.   · It may be helpful to put ice on the body part that was injected to ease the pain and rest the joint for today.  · You may also use pain medication - Tylenol, Advil/Motrin or Aleve as appropriate.  · A cortisone injection may cause systemic reactions, including flushing, feeling hot, irritability or inability to sleep.  · If you are diabetic, a cortisone injection can increase your blood sugar level for several days.  Monitor your glucose levels closely.  · Contact the office [phone] if you feel ill, have excessive pain, if the area turns red or you have a fever

## 2018-09-13 NOTE — Procedures (Signed)
Large Joint Aspiration/Injection Procedure: R knee intra - articular    Date/Time: 09/13/2018 3:55 PM  Consent given by: patient  Site marked: site marked  Timeout: Immediately prior to procedure a time out was called to verify the correct patient, procedure, equipment, support staff and site/side marked as required     Procedure Details    Location: knee - R knee intra - articular  Preparation: The site was prepped using the usual aseptic technique.  Ultrasound guidance:  Ultrasound was utilized to improve needle visualization, injection accuracy, and anatomic localization.    Anesthetics administered: 1 spray ethyl chloride; 1 mL lidocaine hcl 1 %; 1.5 mL lidocaine hcl 1 %  Viscosupplementation & other medications administered: 88 mg hyaluronate 88 MG/4ML  Dressing:  A dry, sterile dressing was applied.  Patient tolerance: patient tolerated the procedure well with no immediate complications

## 2018-09-23 ENCOUNTER — Other Ambulatory Visit: Payer: Self-pay | Admitting: Neurology

## 2018-09-23 NOTE — Telephone Encounter (Signed)
Refill request

## 2018-09-26 MED ORDER — DALFAMPRIDINE 10 MG PO TB12 *I*
10.0000 mg | ORAL_TABLET | Freq: Two times a day (BID) | ORAL | 5 refills | Status: DC
Start: 2018-09-26 — End: 2019-03-08

## 2018-09-27 ENCOUNTER — Other Ambulatory Visit: Payer: Self-pay | Admitting: Neurology

## 2018-09-27 MED ORDER — DIMETHYL FUMARATE 240 MG PO CPDR *I*
240.0000 mg | DELAYED_RELEASE_CAPSULE | Freq: Two times a day (BID) | ORAL | 1 refills | Status: DC
Start: 2018-09-27 — End: 2018-11-25

## 2018-10-31 ENCOUNTER — Encounter: Payer: Self-pay | Admitting: Orthopedic Surgery

## 2018-11-03 ENCOUNTER — Encounter: Payer: Self-pay | Admitting: Sports Medicine

## 2018-11-03 ENCOUNTER — Ambulatory Visit: Payer: Medicare (Managed Care) | Attending: Sports Medicine | Admitting: Sports Medicine

## 2018-11-03 VITALS — BP 110/86 | Ht 71.0 in | Wt 165.0 lb

## 2018-11-03 DIAGNOSIS — M94261 Chondromalacia, right knee: Secondary | ICD-10-CM

## 2018-11-03 DIAGNOSIS — M1711 Unilateral primary osteoarthritis, right knee: Secondary | ICD-10-CM

## 2018-11-03 NOTE — Progress Notes (Signed)
Answers for HPI/ROS submitted by the patient on 11/02/2018   RIGHT KNEE PAIN HPI  What is your goal for today's visit?: reduce pain  Date of onset: : 08/11/2018  Was this the result of an injury?: No  What is your pain level?: 6/10  Please describe the quality of your pain: : aching, burning, radiating, shooting  What diagnostic workup have you had for this condition?: X-ray  What treatments have you tried for this condition?: acetominophen, bracing, ice, physical therapy  Progression since onset: : rapidly worsening  Is this a work related condition? : No  Current work status: : usual activities  Fever: No  Chills: No  Numbness: No  Tingling: No  C.C.: Presents for knee pain     HISTORY OF PRESENT ILLNESS    [name] is a 68 y.o. male presents for follow up and treatment of  right knee pain. This is evaluated as a no injury. The pain began several years ago. He is s/p R knee monovisc injection 09/2018 without benefit. He is reporting a change in his pain and his pain is now more anterior.  He has been using a rowing machine, playing pickleball with symptoms.  Does report nocturnal awaking. No fevers, chills, or recent falls.     Mechanism of injury : none. Current symptoms include crepitus sensation, stiffness and swelling.The pain is located anterior knee.  He describes the symptoms as aching. Symptoms improve with rest, avoiding painful activities. The symptoms are worse with activity, stair climbing, kneeling, deep knee bending. The knee has not given out or felt unstable. The patient can bend and straighten the knee fully.  Denies mechanical symptoms.   Patient reports history of prior problems with this knee, s/p meniscal surgery with Dr. Venia Minks.  Evaluation to date: plain films, which were documented and reviewed as below.     History of meniscal tear with surgery by Dr. Genella Mech & description of previous non surgical right knee treatment:  Activity modification: Y  Physical Therapy:Y   Trial of  medications: Y  Weight loss: N  Assisted ambulatory device: N  Intra-articular injection: Y - Cortisone in the past, Monovisc in 2015 which lasted for 2 years, 12/19 ineffective   Bracing, orthotic devices: No  Contraindications to above treatment: No        Patient's medications, allergies, and surgical histories: Per patient questionnaire, reviewed and confirmed.  Past medical history: Per patient questionnaire, reviewed and confirmed.  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      Environmental allergies 05.25.18    HLD (hyperlipidemia)     Multiple sclerosis     Psoriasis     Tic disorder        Social history: Per patient questionnaire, reviewed and confirmed.   reports that she has never smoked. She does not have any smokeless tobacco history on file..  Family history: Per patient questionnaire, reviewed and confirmed.    Family History   Problem Relation Age of Onset    Heart Disease Mother         coronary art disease, not premature    Cancer Father         multiple myeloma  Cancer Maternal Grandmother         unknown    Multiple Sclerosis Neg Hx     Lupus Neg Hx     Rheum arthritis Neg Hx     Thyroid disease Neg Hx     Diabetes Neg Hx     Colon cancer Neg Hx     Colon polyps Neg Hx     High Blood Pressure Neg Hx      Past Surgical History:   Procedure Laterality Date    COLONOSCOPY      HERNIA REPAIR      HX TONSILLECTOMY/ADENOIDECTOMY      INCISIONAL HERNIA REPAIR      KNEE ARTHROSCOPY Right     OTHER SURGICAL HISTORY      MS    VASECTOMY           Evaluation to date: I personally reviewed patient's X-ray which shows Right knee findings: Moderate medial joint space narrowing. Mild spurring of the tibial spines.      A radiologist  report is available for review.     ROS:  Review of systems:  Per patient questionnaire.   Denies fever, chills, rash, night sweats, unintentional weight loss.  All other systems negative.         OBJECTIVE:   Vitals:    11/03/18 1418   BP: 110/86   Weight: 74.8 kg (165 lb)   Height: 1.803 m (5' 11")       General: WDWN and no acute distress male.  Mental Status:  Alert and oriented x3, pleasant and cooperative with exam.  Extremities: No swelling, erythema, ecchymosis or edema.  Gait:  Nonantalgic     Musculoskeletal:   KNEE: No swelling, erythema, ecchymosis or deformity. no palpable effusion or warmth. Tender to palpation: facets.  Range of motion symmetric to opposite side. Neurovascularly intact.  crepitus.  Stable ligamentous testing.      Anterior Drawer: negative  Posterior Drawer: negative  Lachman's: negative  Valgus stress: negative   Varus stress: stable  McMurray's: negative       ASSESSMENT / PLAN:  Impression:   68 y.o. male with mild/moderate R knee OA, likely underlying chondromalacia and progressive OA.  After discussion today on non-operative treatment options, patient elected to undergo further imaging given progression of symptoms and failure of non-operative management.       Plan:     ICD-10-CM ICD-9-CM   1. Primary osteoarthritis of right knee M17.11 715.16   2. 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  It may be caused from injury.  Treatment is patient driven. Simple interventions for arthritis were discussed. I recommended the use of ice to the joint, topical pain relievers, OTC oral pain relievers such ibuprofen (Advil/Motrin), acetaminophen (Tylenol) or naproxen (Alleve) as appropriate given medical comorbidities.  I recommended the use of a brace or sleeve when appropriate, weight loss or maintenance of proper weight, exercise as tolerated and avoidance of painful activities.  I did recommend steroid  (cortisone) and not hyaluronic acid viscosupplementation injections prn.     MRI R knee  Follow up post imaging  Discussed possibility of operative joint reconstruction consultation pending imaging findings     Questions solicited and answered.     Precautions reviewed.  Medication changes, refills reviewed including directions, side effects and monitoring.      Encarnacion Chu, MD, FAAP, CAQ  Assistant professor,  Department of Orthopaedics    [facility]  Non-operative Osteoarthritis/Adult Reconstruction     Faculty, [place]    Team Physician, [place]     [address] [place]   [place], [place] [address]  Office Phone: [phone]  Email: [email or web address]

## 2018-11-10 ENCOUNTER — Ambulatory Visit: Payer: Medicare (Managed Care) | Admitting: Sports Medicine

## 2018-11-12 ENCOUNTER — Ambulatory Visit
Admission: RE | Admit: 2018-11-12 | Discharge: 2018-11-12 | Disposition: A | Discharge status: Home / Self Care | Payer: Medicare (Managed Care) | Source: Ambulatory Visit

## 2018-11-12 DIAGNOSIS — M1711 Unilateral primary osteoarthritis, right knee: Secondary | ICD-10-CM

## 2018-11-12 DIAGNOSIS — M94261 Chondromalacia, right knee: Secondary | ICD-10-CM

## 2018-11-12 DIAGNOSIS — M238X1 Other internal derangements of right knee: Secondary | ICD-10-CM

## 2018-11-16 ENCOUNTER — Encounter: Payer: Medicare (Managed Care) | Admitting: Primary Care

## 2018-11-16 ENCOUNTER — Encounter: Payer: Self-pay | Admitting: Primary Care

## 2018-11-16 DIAGNOSIS — E785 Hyperlipidemia, unspecified: Secondary | ICD-10-CM

## 2018-11-16 DIAGNOSIS — Z0000 Encounter for general adult medical examination without abnormal findings: Secondary | ICD-10-CM

## 2018-11-16 NOTE — Telephone Encounter (Signed)
PSA already ordered by Dr Ron Agee for July 2020.  Fasting HMP labs placed and patient notified.  FYI only

## 2018-11-19 ENCOUNTER — Other Ambulatory Visit
Admission: RE | Admit: 2018-11-19 | Discharge: 2018-11-19 | Disposition: A | Discharge status: Home / Self Care | Payer: Medicare (Managed Care) | Source: Ambulatory Visit | Attending: Primary Care | Admitting: Primary Care

## 2018-11-19 DIAGNOSIS — Z0000 Encounter for general adult medical examination without abnormal findings: Secondary | ICD-10-CM | POA: Insufficient documentation

## 2018-11-19 DIAGNOSIS — E785 Hyperlipidemia, unspecified: Secondary | ICD-10-CM

## 2018-11-19 LAB — COMPREHENSIVE METABOLIC PANEL
ALT: 25 U/L (ref 0–50)
AST: 31 U/L (ref 0–50)
Albumin: 4.3 g/dL (ref 3.5–5.2)
Alk Phos: 63 U/L (ref 40–130)
Anion Gap: 10 (ref 7–16)
Bilirubin,Total: 0.7 mg/dL (ref 0.0–1.2)
CO2: 29 mmol/L — ABNORMAL HIGH (ref 20–28)
Calcium: 9.1 mg/dL (ref 8.6–10.2)
Chloride: 101 mmol/L (ref 96–108)
Creatinine: 0.97 mg/dL (ref 0.67–1.17)
GFR,Black: 92 *
GFR,Caucasian: 79 *
Glucose: 89 mg/dL (ref 60–99)
Lab: 15 mg/dL (ref 6–20)
Potassium: 4 mmol/L (ref 3.3–5.1)
Sodium: 140 mmol/L (ref 133–145)
Total Protein: 6.4 g/dL (ref 6.3–7.7)

## 2018-11-19 LAB — LIPID PANEL
Chol/HDL Ratio: 3.2
Cholesterol: 211 mg/dL — AB
HDL: 65 mg/dL — ABNORMAL HIGH (ref 40–60)
LDL Calculated: 134 mg/dL — AB
Non HDL Cholesterol: 146 mg/dL
Triglycerides: 61 mg/dL

## 2018-11-19 LAB — HEMOGLOBIN A1C: Hemoglobin A1C: 5 %

## 2018-11-25 ENCOUNTER — Other Ambulatory Visit: Payer: Self-pay | Admitting: Neurology

## 2018-11-25 ENCOUNTER — Encounter: Payer: Self-pay | Admitting: Primary Care

## 2018-11-25 ENCOUNTER — Ambulatory Visit: Payer: Medicare (Managed Care) | Attending: Primary Care | Admitting: Primary Care

## 2018-11-25 VITALS — BP 104/68 | HR 72 | Ht 71.0 in | Wt 167.0 lb

## 2018-11-25 DIAGNOSIS — Z8249 Family history of ischemic heart disease and other diseases of the circulatory system: Secondary | ICD-10-CM

## 2018-11-25 DIAGNOSIS — I712 Thoracic aortic aneurysm, without rupture, unspecified: Secondary | ICD-10-CM

## 2018-11-25 DIAGNOSIS — E785 Hyperlipidemia, unspecified: Secondary | ICD-10-CM

## 2018-11-25 DIAGNOSIS — Z0000 Encounter for general adult medical examination without abnormal findings: Secondary | ICD-10-CM

## 2018-11-25 LAB — PCMH FALL RISK PLAN

## 2018-11-25 LAB — PCMH DEPRESSION ASSESSMENT

## 2018-11-25 LAB — PCMH FALL RISK ASSESSMENT

## 2018-11-25 NOTE — Telephone Encounter (Signed)
CASE ITEM [id number] RX # [id number]  MEMBER # [id number]      LAST SEEN 08/25/17 UPCOMING 12/06/18

## 2018-11-25 NOTE — Patient Instructions (Addendum)
Medial compartment knee degen, options discussed.    Will get [place] of abdominal aorta as precaution.    Lipid profile brings risk-reduction opportunity, and we agree that you will focus upon increase in aerobic frequency/intensity, increase in plant-based fiber, reduction in processed foods and animal fats, consistent portion control.    Will repeat fasting metabolic profile in late summer.    Immunizations and cancer screening are up to date.    Turmeric 500 mg twice daily may provide joint relief.    Dermatologist?  More consistency with balance exercise.  7+ hours of high-quality down time.    Thank you for completing your Annual Exam and Initial Annual Medicare visit   with [place] today.     The purpose of this visits was to:     Screen for disease   Assess risk of future medical problems   Help develop a healthy lifestyle   Update vaccines   Get to know your doctor in case of an illness    Patient Care Team:  Ruby Cola, MD as PCP - General  Gaetana Michaelis, MD as Provider Team (Neurology)  Lennette Bihari Jillene Bucks, MD as Provider Team (Cardiology)  Eli Hose, MD as Provider Team (Ophthalmology)  Courtney Paris, MD as Provider Team (Dermatology)  Lucendia Herrlich, MD as Provider Team (Orthopedic Surgery)  Myrle Sheng, MD as Provider Team (Urology)     Medicare 5 Year Plan    The following items were identified as areas of concern during your screening today:  None identified - This is great news. You have avoided Smoking, Diabetes, High Blood Pressure (Hypertension) and a high BMI (you are not overweight).       The Health Maintenance table below identifies screening tests and immunizations recommended by your health care team:  Health Maintenance   Topic Date Due    LIPID DISORDER SCREENING  11/20/2019    DEPRESSION SCREEN YEARLY  11/26/2019    Fall Risk Screening  11/26/2019    Colon Cancer Screening USPSTF  04/29/2025    IMM-INFLUENZA  Completed    IMM-PREVNAR VACCINE 65 + YRS   Completed    IMM-PNEUMOVAX VACCINE 65 + YRS  Completed    HEPATITIS C SCREENING OFFERED  Completed    IMM-ZOSTER  Completed    HIV TESTING OFFERED  Addressed     In addition, goals and orders placed to address these recommendations are listed in the "Today's Visit" section.    We wish you the best of health and look forward to seeing you again next year for your Annual Medicare Wellness Visit.     If you have any health care concerns before then, please do not hesitate to contact us.

## 2018-11-26 ENCOUNTER — Encounter: Payer: Self-pay | Admitting: Primary Care

## 2018-11-26 NOTE — H&P (Signed)
History and Physical    HISTORY:  Chief Complaint   Patient presents with    Annual Exam    Initial Annual Medicare visit         History of Present Illness:    HPI  Brother recently died of "aortic aneurysm", [name] grieving, very close, also wondering about significance of brother's event for himself.  Cardiology is monitoring his ascending thoracic aorta.    See Problem List annotations.  MS well compensated, neurology f/u in place.  No active exertional sx of concern.  R knee somewhat limiting, f/u visit with ortho pending, MR completed.  Airway reactivity also well compensated.    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      Environmental allergies 05.25.18    HLD (hyperlipidemia)     Multiple sclerosis     Psoriasis     Tic disorder      Past Surgical History:   Procedure Laterality Date    COLONOSCOPY      HERNIA REPAIR      HX TONSILLECTOMY/ADENOIDECTOMY      INCISIONAL HERNIA REPAIR      KNEE ARTHROSCOPY Right     OTHER SURGICAL HISTORY      MS    VASECTOMY           Allergies:    Allergies   Allergen Reactions    Environmental Allergies     Environmental [Mold]        Current medications:    Current Outpatient Medications   Medication Sig    SM GLUCOSAMINE SULFATE PO     dimethyl fumarate (TECFIDERA) 240 mg DR capsule Take 1 capsule (240 mg total) by mouth  2 times daily    dalfampridine (AMPYRA) 10 MG tablet Take 1 tablet (10 mg total) by mouth 2 times daily    fluticasone (FLONASE) 50 MCG/ACT nasal spray 1 spray by Nasal route daily    cholecalciferol (VITAMIN D) 1000 UNIT tablet Take 1,000 Units by mouth daily    aqscorbic acid (VITAMIN C) 1000 MG tablet Take 1,000 mg by mouth daily    Cyanocobalamin (VITAMIN B-12 CR PO) Take 1,000 mcg by mouth daily       tadalafil (CIALIS) 5 MG tablet TAKE 1 TABLET DAILY    zolpidem (AMBIEN CR) 12.5 MG CR tablet TAKE 1 TABLET BY MOUTH NIGHTLY AS NEEDED FOR SLEEP. MAX DAILY DOSE IS 1 TAB    fluticasone-salmeterol (ADVAIR DISKUS) 250-50 MCG/DOSE diskus inhaler Inhale 1 puff into the lungs 2 times daily    cetirizine (ZYRTEC) 10 MG tablet Take 10 mg by mouth daily    albuterol (PROVENTIL HFA) 108 (90 BASE) MCG/ACT inhaler INHALE 2 PUFFS EVERY 6 HOURS AS NEEDED       Family History:  Family History   Problem Relation Age of Onset    Heart Disease Mother         coronary art disease, not premature    Cancer Father         multiple myeloma    Cancer Maternal Grandmother         unknown    Multiple Sclerosis Neg Hx     Lupus Neg Hx     Rheum arthritis Neg Hx     Thyroid disease Neg Hx     Diabetes Neg Hx     Colon cancer Neg Hx     Colon polyps Neg Hx     High Blood Pressure Neg Hx        Social/Occupational History:   Social History     Socioeconomic History    Marital status: Married     Spouse name: Not on file    Number of children: Not on file    Years of education: Not on file    Highest education level: Not on file   Tobacco Use    Smoking status: Never Smoker    Smokeless tobacco: Never Used   Substance and Sexual Activity    Alcohol use: No     Comment: Very occasionaly    Drug use: No    Sexual activity: Never   Other Topics Concern    Not on file   Social History Narrative    Engineer, 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 Has 2 older children.        Exercise -- doubles tennis or pickle ball up to 5  times weekly, joined [employer] and plans to add some resistance machines.        Diet -- varied, more lean proteins, complex carbs, not a lot of dessert, some greens.  Protein shakes at bkfst and lunch, flax, seeds, yogurt.        Sleep -- active dreams, variable sleep success.  No GU interruption.        Safety -- seatbelt and no distractions, smoke detectors, sunglasses, plans to be more consistent with skin block.         Review of Systems:    Review of Systems   Constitutional: Negative for diaphoresis.   HENT: Negative for nosebleeds and sore throat.         No change in headache pattern, none intrusive.  Hearing quite functional.   Eyes: Negative for photophobia and pain.   Respiratory: Negative for cough, sputum production and shortness of breath.    Cardiovascular: Negative for chest pain, palpitations, claudication and leg swelling.   Gastrointestinal: Negative for abdominal pain, blood in stool, constipation, diarrhea, melena, nausea and vomiting.        Heartburn rare, no swallowing difficulties.   Genitourinary: Negative for dysuria, flank pain and hematuria.   Musculoskeletal: Negative for falls.        No intrusive joint or muscle symptoms other than above.   Skin: Negative for rash.        No active moles noted.   Neurological: Negative for dizziness and tingling.   Endo/Heme/Allergies: Negative for polydipsia.   Psychiatric/Behavioral: Negative for depression and memory loss. The patient is not nervous/anxious and does not have insomnia.        Vital Signs:   BP 104/68    Pulse 72    Ht 1.803 m ('5\' 11"'$ )    Wt 75.8 kg (167 lb)    BMI 23.29 kg/m  PHYSICAL EXAM:  Physical Exam   Constitutional: He is oriented to person, place, and time. He appears well-developed and well-nourished. No distress.   HENT:   Head: Atraumatic.   Right Ear: External ear normal.   Left Ear: External ear normal.   Nose: Nose normal.   Mouth/Throat: Oropharynx is clear and moist. No oropharyngeal exudate.   Eyes: Pupils are  equal, round, and reactive to light. Conjunctivae and EOM are normal. No scleral icterus.   Neck: Neck supple. No JVD present. No tracheal deviation present. No thyromegaly present.   Cardiovascular: Normal rate, regular rhythm, normal heart sounds and intact distal pulses.   No murmur heard.  Pulmonary/Chest: Effort normal and breath sounds normal. No respiratory distress. He has no wheezes. He has no rales.   Abdominal: Soft. Bowel sounds are normal. He exhibits no distension and no mass. There is no hepatosplenomegaly. There is no abdominal tenderness.   Musculoskeletal: Normal range of motion.         General: No tenderness or edema.   Lymphadenopathy:     He has no cervical adenopathy.     He has no axillary adenopathy.        Right: No supraclavicular adenopathy present.        Left: No supraclavicular adenopathy present.   Neurological: He is alert and oriented to person, place, and time. No cranial nerve deficit. He exhibits normal muscle tone. Coordination normal.   Skin: Skin is dry. No rash noted. He is not diaphoretic. No erythema.   No suspicious nevi.   Psychiatric: He has a normal mood and affect. His behavior is normal.     Component      Latest Ref Rng & Units 11/19/2018 08/11/2018 11/15/2017   WBC      4.2 - 9.1 THOU/uL  5.2    RBC      4.6 - 6.1 MIL/uL  4.6    Hemoglobin      13.7 - 17.5 g/dL  14.4    Hematocrit      40 - 51 %  42    MCV      79 - 92 fL  90    MCH      26 - 32 pg/cell  31    MCHC      32 - 37 g/dL  35    RDW      11.6 - 14.4 %  12.8    Platelets      150 - 330 THOU/uL  187    Seg Neut %      %  64.7    Lymphocyte %      %  20.4    Monocyte %      %  11.4    Eosinophil %      %  2.3    Basophil %      %  0.6    Neut # K/uL      1.8 - 5.4 THOU/uL  3.4    Lymph # K/uL      1.3 - 3.6 THOU/uL  1.1 (L)    Mono # K/uL      0.3 - 0.8 THOU/uL  0.6    Eos # K/uL      0.0 - 0.5 THOU/uL  0.1    Baso # K/uL      0.0 - 0.1 THOU/uL  0.0    Nucl RBC %      0.0 - 0.2 /100 WBC  0.0    Nucl RBC # K/uL       0.0 - 0.0 THOU/uL  0.0    IMM Granulocytes #      THOU/uL  0.0    IMM Granulocytes      %  0.6    Sodium      133 - 145 mmol/L 140  144   Potassium      3.3 - 5.1 mmol/L 4.0  4.6   Chloride      96 - 108 mmol/L 101  106   CO2      20 - 28 mmol/L 29 (H)  28   Anion Gap      7 - '16 10  10   '$ UN      6 - 20 mg/dL 15  14   Creatinine      0.67 - 1.17 mg/dL 0.97  0.96   GFR,Caucasian      * 79  81   GFR,Black      * 92  94   Glucose      60 - 99 mg/dL 89  88   Calcium      8.6 - 10.2 mg/dL 9.1  9.2   Total Protein      6.3 - 7.7 g/dL 6.4 6.3 6.0 (L)   Albumin      3.5 - 5.2 g/dL 4.3 4.2 4.3   Bilirubin,Total      0.0 - 1.2 mg/dL 0.7 0.7 0.7   AST      0 - 50 U/L 31 31 32   ALT      0 - 50 U/L '25 26 22   '$ Alk Phos      40 - 130 U/L 63 64 58   Bilirubin,Direct      0.0 - 0.3 mg/dL  <0.2    Cholesterol      mg/dL 211 (A)  185   Triglycerides      mg/dL 61  43   HDL Cholesterol      40 - 60 mg/dL 65 (H)  59   LDL Calculated      mg/dL 134 (A)  117   Non HDL Cholesterol      mg/dL 146  126   Chol/HDL Ratio       3.2  3.1   TSH      0.27 - 4.20 uIU/mL   2.40   Hemoglobin A1C      % 5.0  5.1   PSA (eff. 01-2009)      0.00 - 4.00 ng/mL   1.01         Assessment:    [name] was seen today for annual exam and initial annual medicare visit.    Diagnoses and all orders for this visit:    Thoracic aortic aneurysm  -     US ABDOMINAL AORTIC ANEURYSM SCREEN; Future    Family history of aortic aneurysm  -     US ABDOMINAL AORTIC ANEURYSM SCREEN; Future    Hyperlipidemia, unspecified  -     Lipid Panel (Reflex to Direct  LDL if Triglycerides more than 400); Future    We reviewed the MR imaging, mostly medial compartment degen.  Not clear that he would be candidate for medial "partial knee" procedure.    Discussed aorta situation, and he'd like abd aorta checked, reasonable.    All generally well compensated as above.    Healthy habits reinforced.  Reviewed CV risk profile, opportunities for  risk reduction.  Discussed CA screening  options.  Discussed immunization schedule, importance of adv directive.  Plan as below.   .      Plan:        Medial compartment knee degen, options discussed.    Will get [place] of abdominal aorta as precaution.    Lipid profile brings risk-reduction opportunity, and we agree that you will focus upon increase in aerobic frequency/intensity, increase in plant-based fiber, reduction in processed foods and animal fats, consistent portion control.    Will repeat fasting metabolic profile in late summer.    Immunizations and cancer screening are up to date.    Turmeric 500 mg twice daily may provide joint relief.    Dermatologist?  More consistency with balance exercise.  7+ hours of high-quality down time.

## 2018-11-26 NOTE — Progress Notes (Signed)
Today we reviewed and updated [name] smoking status, activities of daily living, depression screen, fall risk, medications and allergies.   I have counseled the patient in the above areas.     Subjective:     Chief Complaint: [name] is a 68 y.o. male here for a/an Annual Exam and Initial Annual Medicare visit    In general, Xayne Brumbaugh rates their overall health as:  good      Patient Care Team:  Ivory Broad, MD as PCP - Maurine Cane, MD as Provider Team (Neurology)  Jimmye Norman, MD as Provider Team (Cardiology)  Virgel Gess, MD as Provider Team (Ophthalmology)  Moss Mc, MD as Provider Team (Dermatology)  Leona Carry, MD as Provider Team (Orthopedic Surgery)  Kathreen Cosier, MD as Provider Team (Urology)     Current Outpatient Medications on File Prior to Visit   Medication Sig Dispense Refill    SM GLUCOSAMINE SULFATE PO       dimethyl fumarate (TECFIDERA) 240 mg DR capsule Take 1 capsule (240 mg total) by mouth 2 times daily 60 capsule 1    dalfampridine (AMPYRA) 10 MG tablet Take 1 tablet (10 mg total) by mouth 2 times daily 60 tablet 5    fluticasone (FLONASE) 50 MCG/ACT nasal spray 1 spray by Nasal route daily      cholecalciferol (VITAMIN D) 1000 UNIT tablet Take 1,000 Units by mouth daily      aqscorbic acid (VITAMIN C) 1000 MG tablet Take 1,000 mg by mouth daily      Cyanocobalamin (VITAMIN B-12 CR PO) Take 1,000 mcg by mouth daily         tadalafil (CIALIS) 5 MG tablet TAKE 1 TABLET DAILY 90 tablet 4    zolpidem (AMBIEN CR) 12.5 MG CR tablet TAKE 1 TABLET BY MOUTH NIGHTLY AS NEEDED FOR SLEEP. MAX DAILY DOSE IS 1 TAB 15 tablet 0    fluticasone-salmeterol (ADVAIR DISKUS) 250-50 MCG/DOSE diskus inhaler Inhale 1 puff into the lungs 2 times daily      cetirizine (ZYRTEC) 10 MG tablet Take 10 mg by mouth daily      albuterol (PROVENTIL HFA) 108 (90 BASE) MCG/ACT inhaler INHALE 2 PUFFS EVERY 6 HOURS AS NEEDED 7 0     No current  facility-administered medications on file prior to visit.      Allergies   Allergen Reactions    Environmental Allergies     Environmental [Mold]      Patient Active Problem List    Diagnosis Date Noted    Multiple sclerosis-[facility]PATH CONSENT [id number] 05/30/2008     Priority: High     Relapsing; remitting, doing well on oral Tecfidera.  Taking coenzyme Q10 for muscle irritability, seems better.        Extrinsic allergic asthma 11/28/2012     Priority: Medium     Joyce Gross follows, usually on max therapy in anticipation of bad ragweed season.      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  See 04/06/16 echo ([facility]) mild.        Hyperlipidemia 05/30/2008     Priority: Medium     Normal TG, just normal HDL, and LDL as high as 150's at times.  No premature CVD in family, mother with  CAD late.  Started statin in 2014, supplementing with coenzyme Q10 to minimize muscle side effects.      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      Environmental allergies 05.25.18    HLD (hyperlipidemia)     Multiple sclerosis     Psoriasis     Tic disorder      Past Surgical History:   Procedure Laterality Date    COLONOSCOPY      HERNIA REPAIR      HX TONSILLECTOMY/ADENOIDECTOMY      INCISIONAL HERNIA REPAIR      KNEE ARTHROSCOPY Right     OTHER SURGICAL HISTORY      MS    VASECTOMY       Family History   Problem Relation Age of Onset    Heart Disease Mother          coronary art disease, not premature    Cancer Father         multiple myeloma    Cancer Maternal Grandmother         unknown    Multiple Sclerosis Neg Hx     Lupus Neg Hx     Rheum arthritis Neg Hx     Thyroid disease Neg Hx     Diabetes Neg Hx     Colon cancer Neg Hx     Colon polyps Neg Hx     High Blood Pressure Neg Hx      Social History     Socioeconomic History    Marital status: Married     Spouse name: Not on file    Number of children: Not on file    Years of education: Not on file    Highest education level: Not on file   Occupational History    Not on file   Tobacco Use    Smoking status: Never Smoker    Smokeless tobacco: Never Used   Substance and Sexual Activity    Alcohol use: No     Comment: Very occasionaly    Drug use: No    Sexual activity: Never   Social History Programmer, 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        Exercise -- doubles tennis or pickle ball up to 5 times weekly, joined [employer] and plans to add some resistance machines.        Diet -- varied, more lean proteins, complex carbs, not a lot of dessert, some greens.  Protein shakes at bkfst and lunch, flax, seeds, yogurt.        Sleep -- active dreams, variable sleep success.  No GU interruption.        Safety -- seatbelt and no distractions, smoke detectors, sunglasses, plans to be more consistent with skin block.       Objective:     Vital Signs: BP 104/68    Pulse 72    Ht 1.803 m ('5\' 11"'$ )    Wt 75.8  kg (167 lb)    BMI 23.29 kg/m    BMI: Body mass index is 23.29 kg/m.    Vision Screening Results (Welcome visit only):  No exam data present    Depression Screening Results:  Recent Review Flowsheet Data     PHQ Scores 11/25/2018 11/25/2017 03/16/2017 11/05/2016    PSQ2 Q1 - Interest/Pleasure N N N N    PSQ2 Q2 - Down, Depressed, Hopeless N N N N        Opioid Use/DAST- 10 Screening Results:   No data recorded  Activities of Daily Living/Functional Screening Results:  Is the person deaf or does  he/she have serious difficulty hearing?: N  Is this person blind or does he/she have serious difficulty seeing even when wearing glasses?: N  *Vision Status: Visual aid   Does this person have serious difficulty walking or climbing stairs?: N  Does this person have difficulty dressing or bathing?: N  *Shopping: Independent  *House Keeping: Independent  *Managing Own Medications: Independent  *Handling Finances: Independent  Difficulty doing errands due to a physicial, mental or emotional condition: No  Difficulty remembering or making decisions due to a physicial, mental or emotional condition: No      Fall Risk Screening Results:  Have you fallen in the last year?: No  Do you feel you are at risk for falling?: No      Assessment and Plan:      Cognitive Function:  Recall of recent and remote events appears:  Normal      Advanced Care Planning:  was discussed and patient received paperwork to review     The following health maintenance plan was reviewed with the patient:    Health Maintenance Topics with due status: Not Due       Topic Last Completion Date    Colon Cancer Screening USPSTF 04/30/2015    LIPID DISORDER SCREENING 11/19/2018    DEPRESSION SCREEN YEARLY 11/25/2018    Fall Risk Screening 11/25/2018     Health Maintenance Topics with due status: Completed       Topic Last Completion Date    HEPATITIS C SCREENING OFFERED 12/15/2013    IMM-PREVNAR VACCINE 65 + YRS 02/28/2015    IMM-PNEUMOVAX VACCINE 65 + YRS 11/05/2016    IMM-ZOSTER 05/07/2017    IMM-INFLUENZA 07/20/2018     Health Maintenance Topics with due status: Addressed       Topic Date Due    HIV TESTING OFFERED Addressed     This health maintenance schedule, identified risks, a list of orders placed today and patient goals have been provided to [place] Cal[facility] in the after visit summary.     Plan for any concerns identified during screening or risk assessments:  See Patient Instructions/Plan

## 2018-11-28 MED ORDER — DIMETHYL FUMARATE 240 MG PO CPDR *I*
240.0000 mg | DELAYED_RELEASE_CAPSULE | Freq: Two times a day (BID) | ORAL | 1 refills | Status: DC
Start: 2018-11-28 — End: 2018-12-13

## 2018-11-29 ENCOUNTER — Ambulatory Visit: Payer: Medicare (Managed Care) | Admitting: Sports Medicine

## 2018-11-29 ENCOUNTER — Ambulatory Visit: Payer: Medicare (Managed Care) | Attending: Sports Medicine | Admitting: Sports Medicine

## 2018-11-29 VITALS — BP 118/80 | Ht 71.0 in | Wt 165.0 lb

## 2018-11-29 DIAGNOSIS — M1711 Unilateral primary osteoarthritis, right knee: Secondary | ICD-10-CM

## 2018-11-29 DIAGNOSIS — M25361 Other instability, right knee: Secondary | ICD-10-CM

## 2018-11-29 DIAGNOSIS — M94261 Chondromalacia, right knee: Secondary | ICD-10-CM

## 2018-11-29 NOTE — Progress Notes (Signed)
Answers for HPI/ROS submitted by the patient on 11/23/2018   RIGHT KNEE PAIN HPI  What is your goal for today's visit?: TO REDUCE PAIN  Date of onset: : 07/12/2018  Was this the result of an injury?: No  What is your pain level?: 5/10  Please describe the quality of your pain: : aching, instability, radiating, sharp  What diagnostic workup have you had for this condition?: MRI scan, X-ray  What treatments have you tried for this condition?: acetominophen, bracing, glucosamine  Progression since onset: : gradually worsening  Is this a work related condition? : No  Current work status: : usual activities  Fever: No  Chills: No  Numbness: Yes  Tingling: Yes  Answers for HPI/ROS submitted by the patient on 11/02/2018   RIGHT KNEE PAIN HPI  What is your goal for today's visit?: reduce pain  Date of onset: : 08/11/2018  Was this the result of an injury?: No  What is your pain level?: 6/10  Please describe the quality of your pain: : aching, burning, radiating, shooting  What diagnostic workup have you had for this condition?: X-ray  What treatments have you tried for this condition?: acetominophen, bracing, ice, physical therapy  Progression since onset: : rapidly worsening  Is this a work related condition? : No  Current work status: : usual activities  Fever: No  Chills: No  Numbness: No  Tingling: No  C.C.: Presents for knee pain     HISTORY OF PRESENT ILLNESS    [name] is a 68 y.o. male presents for MRI follow up and treatment of  right knee pain. MRI was personally reviewed and discussed with patient today with details as noted below.    This is evaluated as a no injury. The pain began several years ago. He is s/p R knee monovisc injection 09/2018 without benefit. He is reporting a change in his pain and his pain is now more anterior.  He has been using a rowing machine, playing pickleball with symptoms.  Does report nocturnal awaking. No fevers, chills, or recent falls.     Mechanism of injury : none. Current  symptoms include crepitus sensation, stiffness and swelling.The pain is located anterior knee.  He describes the symptoms as aching. Symptoms improve with rest, avoiding painful activities. The symptoms are worse with activity, stair climbing, kneeling, deep knee bending. The knee has not given out or felt unstable. The patient can bend and straighten the knee fully.  Denies mechanical symptoms.   Patient reports history of prior problems with this knee, s/p meniscal surgery with Dr. Venia Minks.  Evaluation to date: plain films, which were documented and reviewed as below.     History of meniscal tear with surgery by Dr. Genella Mech & description of previous non surgical right knee treatment:  Activity modification: Y  Physical Therapy:Y   Trial of medications: Y  Weight loss: N  Assisted ambulatory device: N  Intra-articular injection: Y - Cortisone in the past, Monovisc in 2015 which lasted for 2 years, 12/19 ineffective   Bracing, orthotic devices: No  Contraindications to above treatment: No        Patient's medications, allergies, and surgical histories: Per patient questionnaire, reviewed and confirmed.  Past medical history: Per patient questionnaire, reviewed and confirmed.  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      Environmental allergies 05.25.18    HLD (hyperlipidemia)     Multiple sclerosis     Psoriasis     Tic disorder        Social history: Per patient questionnaire, reviewed and confirmed.   reports that she has never smoked. She does not have any smokeless tobacco history on file..  Family history: Per patient questionnaire, reviewed and confirmed.    Family History   Problem Relation Age of Onset    Heart Disease Mother          coronary art disease, not premature    Cancer Father         multiple myeloma    Cancer Maternal Grandmother         unknown    Multiple Sclerosis Neg Hx     Lupus Neg Hx     Rheum arthritis Neg Hx     Thyroid disease Neg Hx     Diabetes Neg Hx     Colon cancer Neg Hx     Colon polyps Neg Hx     High Blood Pressure Neg Hx      Past Surgical History:   Procedure Laterality Date    COLONOSCOPY      HERNIA REPAIR      HX TONSILLECTOMY/ADENOIDECTOMY      INCISIONAL HERNIA REPAIR      KNEE ARTHROSCOPY Right     OTHER SURGICAL HISTORY      MS    VASECTOMY           Evaluation to date: I personally reviewed patient's X-ray which shows Right knee findings: Moderate medial joint space narrowing. Mild spurring of the tibial spines.     Mr Knee Rt Without Contrast    Result Date: 11/12/2018  11/12/2018 11:18 AM MRI RIGHT KNEE WITHOUT CONTRAST CLINICAL INFORMATION:  R knee pain, concern for chondromalacia, medial meniscus tear. Moderate OA, M17.11-Unilateral primary osteoarthritis, right kneeM94.261-Chondromalacia, right knee.  History of partial medial meniscectomy, medial knee pain COMPARISON:  Knee radiographs 09/13/2018 PROCEDURE: Multiplanar multisequence MRI was performed without intravenous contrast. FINDINGS: The ACL and PCL, distal quadriceps and patellar tendon and the collateral ligaments are intact. Medial meniscus is diminutive in size consistent with history of prior meniscectomy. A re-tear is not identified, although mucoid degenerative signal changes are seen involving the remaining body series 7 image 17. Areas of full-thickness chondral loss are noted in the medial compartment with underlying geode formation and reactive marrow edema. The lateral meniscus demonstrates mucoid degeneration along the inner margin of the body series 7 image 14 and 15. Areas of partial-thickness focal chondral loss are noted along the posterior aspect of the lateral femoral condyle series 7 image 11. Partial-thickness  chondral loss is noted over the trochlear groove. Slight chondral irregularities noted over the medial patellar facet. A small amount of joint fluid is noted with a tiny neck of a Baker's cyst. No loose bodies are identified.     Tricompartmental chondral degeneration worst in the medial. Postoperative appearance to the medial meniscus without evidence for re-tear. There is mucoid degeneration are seen involving the remaining medial and lateral meniscus. END OF IMPRESSION [facility] submits this DICOM format image data and final report to the [facility], an independent secure electronic health information exchange, on a reciprocally searchable basis (with patient authorization) for a minimum  of 12 months after exam date.     A radiologist report is available for review.     ROS:  Review of systems:  Per patient questionnaire.   Denies fever, chills, rash, night sweats, unintentional weight loss.  All other systems negative.         OBJECTIVE:   Vitals:    11/29/18 0938   BP: 118/80   Weight: 74.8 kg (165 lb)   Height: 1.803 m (_0 )       General: WDWN and no acute distress male.  Mental Status:  Alert and oriented x3, pleasant and cooperative with exam.  Extremities: No swelling, erythema, ecchymosis or edema.  Gait:  Nonantalgic     Musculoskeletal:   KNEE: No swelling, erythema, ecchymosis or deformity. no palpable effusion or warmth. Tender to palpation: facets.  Range of motion symmetric to opposite side. Neurovascularly intact.  crepitus.  Stable ligamentous testing.      Anterior Drawer: negative  Posterior Drawer: negative  Lachman's: negative  Valgus stress: negative   Varus stress: stable  McMurray's: negative       ASSESSMENT / PLAN:  Impression:   68 y.o. male with moderate/severe R knee OA, chondromalacia and progressive OA.  After discussion today on non-operative treatment options, patient elected to undergo operative consultation given progression of symptoms (instability) and failure of  non-operative management.       Plan:     ICD-10-CM ICD-9-CM   1. Primary osteoarthritis of right knee M17.11 715.16   2. 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 Knee instability, right M25.361 718.86     -We discussed that osteoarthritis is a chronic and usually progressive disease.  It may be caused from injury.  Treatment is patient driven. Simple interventions for arthritis were discussed. I recommended the use of ice to the joint, topical pain relievers, OTC oral pain relievers such ibuprofen (Advil/Motrin), acetaminophen (Tylenol) or naproxen (Alleve) as appropriate given medical comorbidities.  I recommended the use of a brace or sleeve when appropriate, weight loss or maintenance of proper weight, exercise as tolerated and avoidance of painful activities.  I did not recommend further steroid (cortisone) and hyaluronic acid viscosupplementation injections given prior failure.     Discussed coolief RFA, educational materials also provided  Referred to operative joint division   Questions solicited and answered.     Precautions reviewed.  Medication changes, refills reviewed including directions, side effects and monitoring.      Encarnacion Chu, MD, FAAP, CAQ  Assistant professor, Department of Orthopaedics    Primary Care-Internal Medicine/Pediatrics Sports Medicine & Orthopaedics  Non-operative Osteoarthritis/Adult Reconstruction     Faculty, [place]    Team Physician, [place]     [address], [place]   [place], [place] [address]  Office Phone: [phone]  Email: [email or web address]

## 2018-12-05 NOTE — Progress Notes (Signed)
Multiple Sclerosis Clinic Follow-up Visit    Subjective:  [name] is a 68 y.o. M here for a regular follow-up of relapsing remitting MS. Since his last clinic visit he has been stable neurologically.  He has balance issues, so needs to hold on to a railing when going down the stairs.     R thigh "spasms"/abnormal sensation ongoing. Less bothersome.  Tegretol not ongoing.  Ampyra is helpful. He continues being active, playing tennis and snowshoeing.    Hand/foot tingling is present.  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    He recently lost his brother to AAA rupture, so is having his AA monitored.    MS History:  Clinical presentations:   1990's R arm weakness   Later numbness hands and feet and stereotyped abnormal distal R thigh sensation  MRIs:   4/08 brain-relatively mild lesion burden, 1 enhancing lesion at the time   5/13 brain-no new lesions   11/14 brain-unchanged   10/16 brain-unchanged   4/18 brain-unchanged                 Thoracic-few, non-enhancing intramedullary lesions  Other testing:   2/20 LFT wnl   10/19 abs lymph 1.   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 Never smoker.       Meds:  Current Outpatient Medications on File Prior to Visit   Medication Sig Dispense Refill    dimethyl fumarate (TECFIDERA) 240 mg DR capsule Take 1 capsule (240 mg total) by mouth 2 times daily 60 capsule 1    SM GLUCOSAMINE SULFATE PO       dalfampridine (AMPYRA) 10 MG tablet Take 1 tablet (10 mg total) by mouth 2 times daily 60 tablet 5    tadalafil (CIALIS) 5 MG tablet TAKE 1 TABLET DAILY 90 tablet 4    zolpidem (AMBIEN CR) 12.5 MG CR tablet TAKE 1 TABLET BY MOUTH NIGHTLY AS NEEDED FOR SLEEP. MAX DAILY DOSE IS 1 TAB 15 tablet 0    fluticasone-salmeterol (ADVAIR DISKUS) 250-50 MCG/DOSE diskus inhaler Inhale 1 puff into the lungs 2 times daily      cetirizine (ZYRTEC) 10 MG tablet Take 10 mg by mouth daily      fluticasone (FLONASE) 50 MCG/ACT nasal spray  1 spray by Nasal route daily      cholecalciferol (VITAMIN D) 1000 UNIT tablet Take 1,000 Units by mouth daily      aqscorbic acid (VITAMIN C) 1000 MG tablet Take 1,000 mg by mouth daily      Cyanocobalamin (VITAMIN B-12 CR PO) Take 1,000 mcg by mouth daily         albuterol (PROVENTIL HFA) 108 (90 BASE) MCG/ACT inhaler INHALE 2 PUFFS EVERY 6 HOURS AS NEEDED 7 0     No current facility-administered medications on file prior to visit.         ROS:  15+point ROS reviewed. Will be scanned into medical record.     Vitals  Blood pressure 129/75, pulse 54, height 1.803 m (5\' 11" ), weight 75.8 kg (167 lb).    PE:  Gen: Well appearing, NAD.  MS: Alert. Fluent. Affect appropriate for situation.  CN: Pupils 4 to 50mm without APD.  Choppy pursuits. Full facial strength. Hearing full to soft voice.   Motor: Normal tone.  (R/L) FE 5/5, HF 4/5, ADF 5/5. No pronator drift.Tic like movements of head.  Sens: FUll LT extremities.  Coord: No ataxia on FTN.  FInger taps normal.  Gait: Mildly wide-based casual gait. 25 ft walk 3.84 sec without assist device (+knee pain).    Assesment and Plan:  68 y.o. M with relatively mild relapsing remitting MS on Tecfidera as DMT who has not had recent clinical or radiologic breakthough disease. He has only very mild lymphopenia and tolerates this med, so it will be continued with Q6 month CBC and LFT.   MRI brain and thoracic cord 11/20.  He will consider adding a stress reduction technique.      Return to clinic in 6-12 months.    Rock Sobol, MD  Neuroimmunology

## 2018-12-06 ENCOUNTER — Ambulatory Visit: Payer: Medicare (Managed Care) | Attending: Neurology | Admitting: Neurology

## 2018-12-06 ENCOUNTER — Encounter: Payer: Self-pay | Admitting: Neurology

## 2018-12-06 VITALS — BP 129/75 | HR 54 | Ht 71.0 in | Wt 167.0 lb

## 2018-12-06 DIAGNOSIS — R269 Unspecified abnormalities of gait and mobility: Secondary | ICD-10-CM | POA: Insufficient documentation

## 2018-12-06 DIAGNOSIS — G35 Multiple sclerosis: Secondary | ICD-10-CM

## 2018-12-06 NOTE — Patient Instructions (Signed)
You are due to blood monitoring while on Tecfidera in April and Oct, at any [facility] lab, no lab slip needed.      Call a few weeks prior to your Nov MRI to ask for Valium, which is fine if you have a driver.    In the future you are welcome to a referral for Balance training focused PT.    Consider simple breathing exercise, guided meditations, white noise, calming music etc (on possible app is Insight Timer), to help with the early morning awakenings.    YOu can look at clinictrials.gov to find out about research across the country.

## 2018-12-07 ENCOUNTER — Ambulatory Visit: Payer: Medicare (Managed Care) | Attending: Orthopedic Surgery | Admitting: Orthopedic Surgery

## 2018-12-07 VITALS — BP 112/84 | Ht 69.88 in | Wt 166.0 lb

## 2018-12-07 DIAGNOSIS — M1711 Unilateral primary osteoarthritis, right knee: Secondary | ICD-10-CM

## 2018-12-08 ENCOUNTER — Ambulatory Visit
Admission: RE | Admit: 2018-12-08 | Discharge: 2018-12-08 | Disposition: A | Discharge status: Home / Self Care | Payer: Medicare (Managed Care) | Source: Ambulatory Visit

## 2018-12-08 DIAGNOSIS — Z87891 Personal history of nicotine dependence: Secondary | ICD-10-CM

## 2018-12-08 DIAGNOSIS — Z8249 Family history of ischemic heart disease and other diseases of the circulatory system: Secondary | ICD-10-CM

## 2018-12-08 DIAGNOSIS — Z136 Encounter for screening for cardiovascular disorders: Secondary | ICD-10-CM

## 2018-12-08 DIAGNOSIS — I712 Thoracic aortic aneurysm, without rupture, unspecified: Secondary | ICD-10-CM

## 2018-12-09 NOTE — Progress Notes (Signed)
Chief Complaint: Right knee pain    History of present illness: [name] is 68 y.o. male, who presents to my clinic with a chief complaint of global knee pain, which began right ago. The patient currently describes his pain as dull, and continuous. The patient rates his pain as a 5+/10 in intensity, and reports that his symptoms are exacerbated by prolonged standing, prolonged ambulation, climbing up and down stairs and relieved with rest. The patient denies a history of recent or remote knee trauma, mechanical falls/safety issues, radicular pain, and lower extremity paresthesias.  Associated symptoms include crepitus . Prior non surgical treatment is outlined below. S/P right knee arthroscopy.    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      Environmental allergies 05.25.18    HLD (hyperlipidemia)     Multiple sclerosis     Psoriasis     Tic disorder        Past Surgical History:   Procedure Laterality Date    COLONOSCOPY      HERNIA REPAIR      HX TONSILLECTOMY/ADENOIDECTOMY      INCISIONAL HERNIA REPAIR      KNEE ARTHROSCOPY Right     OTHER SURGICAL HISTORY      MS    VASECTOMY         Family History   Problem Relation Age of Onset    Heart Disease Mother         coronary art disease, not  premature    Cancer Father         multiple myeloma    Cancer Maternal Grandmother         unknown    Multiple Sclerosis Neg Hx     Lupus Neg Hx     Rheum arthritis Neg Hx     Thyroid disease Neg Hx     Diabetes Neg Hx     Colon cancer Neg Hx     Colon polyps Neg Hx     High Blood Pressure Neg Hx        Social History: Illicit Drug Use - denies; Tobacco: non; EtOH: social; active playing pickle ball    Medication:   Current Outpatient Medications:     dimethyl fumarate (TECFIDERA) 240 mg DR capsule, Take 1 capsule (240 mg total) by mouth 2 times daily, Disp: 60 capsule, Rfl: 1    SM GLUCOSAMINE SULFATE PO, , Disp: , Rfl:     dalfampridine (AMPYRA) 10 MG tablet, Take 1 tablet (10 mg total) by mouth 2 times daily, Disp: 60 tablet, Rfl: 5    tadalafil (CIALIS) 5 MG tablet, TAKE 1 TABLET DAILY, Disp: 90 tablet, Rfl: 4    zolpidem (AMBIEN CR) 12.5 MG CR tablet, TAKE 1 TABLET BY MOUTH NIGHTLY  AS NEEDED FOR SLEEP. MAX DAILY DOSE IS 1 TAB, Disp: 15 tablet, Rfl: 0    fluticasone-salmeterol (ADVAIR DISKUS) 250-50 MCG/DOSE diskus inhaler, Inhale 1 puff into the lungs 2 times daily, Disp: , Rfl:     cetirizine (ZYRTEC) 10 MG tablet, Take 10 mg by mouth daily, Disp: , Rfl:     fluticasone (FLONASE) 50 MCG/ACT nasal spray, 1 spray by Nasal route daily, Disp: , Rfl:     cholecalciferol (VITAMIN D) 1000 UNIT tablet, Take 1,000 Units by mouth daily, Disp: , Rfl:     aqscorbic acid (VITAMIN C) 1000 MG tablet, Take 1,000 mg by mouth daily, Disp: , Rfl:     Cyanocobalamin (VITAMIN B-12 CR PO), Take 1,000 mcg by mouth daily   , Disp: , Rfl:     albuterol (PROVENTIL HFA) 108 (90 BASE) MCG/ACT inhaler, INHALE 2 PUFFS EVERY 6 HOURS AS NEEDED, Disp: 7, Rfl: 0    Allergies:    Allergies   Allergen Reactions    Environmental Allergies     Environmental [Mold]        ROS: Denies chest pain, fever, chills, SOB, radicular pain, paresthesias, all remaining systems negative    Physical Exam:  General: Vitals were reviewed  after taken by a technician:   Blood pressure 112/84, height 1.775 m (5' 9.88"), weight 75.3 kg (166 lb).  Body mass index is 23.9 kg/m.  Constitutional: NAD, appears stated age  Psychiatric: Normal affect  HEENT: hearing intact to audible conversation  Respiratory: Non labored breathing  Neurologic: Non focal exam   Vascular: Compartments soft and compressible  Musculoskeletal exam: A complete exam of the right  lower extremity was performed, details include:  Gait: antalgic - None  Inspection: soft tissue envelope intact right  knee, trace knee effusion  Alignment: mild varus right lower extremity  Palpation:  moderate tenderness medial femoral condyle medial joint line right  knee,   Calor right knee:None  ROM right hip: Well preserved while seated in flexion, internal/external rotation  ROM right knee: 0 - 130 degrees  Crepitus right knee: Mild   Instability right knee: coronal plane - None, sagittal plane - None  Muscle strength: 5/5 hip flexors, quadriceps, hamstring, 5/5 ankle plantarflexion/dorsiflexion.  Special testing: negative posterior drawer     Radiographs/Imaging: 4 views of the right  knee were reviewed and demonstrate: severe narrowing of the tibiofemoral joint space, mild patellafemoral degenerative disease, mild varus alignment, soft tissue unremarkable.     Impression:  1. Right knee osteoarthritis -  Severe  2. Medical Co-morbidities including: MS    Plan:   I had a long discussion with the patient in the office today. We discussed the patient's radiographs, and the natural history of his condition.  We discussed ongoing symptomatic treatment and alternatives to surgery including physical therapy, weight loss, NSAIDS, intra-articular steroid/viscosupplementation injections. We also discussed knee reconstruction including the rationale for surgery, the technical aspects of the surgery, the anticipated postoperative course, as well as the risks related to total knee arthroplasty. These risks  include but are not limited to infection requiring IV antibiotics, further surgery, and in extreme cases loss of limb. We discussed nerve injury resulting in permanent leg weakness/foot drop, and gait impairment, blood vessel injury requiring further surgery for limb salvage, and in extreme cases loss of limb, bleeding requiring transfusion, iatrogenic femur and/or tibia fracture, knee instability, incomplete relief of symptoms, wound drainage, need for further surgery, post operative stiffness requiring manipulation under anesthesia, reflex sympathetic dystrophy,  risks of anesthesia, risk of blood clots in the legs and/or lungs, post-op ileus, risk of exacerbation of underlying medical issues including heart attack, stroke, respiratory failure, kidney failure, liver failure, and even death. The patient understood these risks and may proceed with surgery on an elective basis.      Implant selection discussed, questions entertained and answered.    RightTKA Plan:  CPT: 81448  Abx: Ancef pending screen  Equipment:ZB Vanguard   DVT: ASA  Special Considerations: Multiple sclerosis      Rationale for deviation from stepped care approach: none

## 2018-12-11 ENCOUNTER — Encounter: Payer: Self-pay | Admitting: Neurology

## 2018-12-12 ENCOUNTER — Encounter: Payer: Self-pay | Admitting: Neurology

## 2018-12-12 DIAGNOSIS — G35 Multiple sclerosis: Secondary | ICD-10-CM

## 2018-12-13 ENCOUNTER — Other Ambulatory Visit: Payer: Self-pay | Admitting: Neurology

## 2018-12-13 MED ORDER — DIMETHYL FUMARATE 240 MG PO CPDR *I*
240.0000 mg | DELAYED_RELEASE_CAPSULE | Freq: Two times a day (BID) | ORAL | 1 refills | Status: DC
Start: 2018-12-13 — End: 2019-05-29

## 2018-12-13 NOTE — Telephone Encounter (Signed)
90 Day Refill Request

## 2018-12-20 ENCOUNTER — Other Ambulatory Visit: Payer: Self-pay | Admitting: Primary Care

## 2018-12-20 ENCOUNTER — Encounter: Payer: Self-pay | Admitting: Neurology

## 2018-12-20 MED ORDER — ZOLPIDEM TARTRATE 12.5 MG PO TBCR *I*
ORAL_TABLET | ORAL | 0 refills | Status: DC
Start: 2018-12-20 — End: 2019-07-10

## 2018-12-20 NOTE — Telephone Encounter (Signed)
Last seen 11/25/2018  Next appt 05/29/2019  Voicemail from pharmacy requesting refill on zolpidem  Last filled 01/21/2018

## 2018-12-21 ENCOUNTER — Telehealth: Payer: Self-pay | Admitting: Primary Care

## 2018-12-21 NOTE — Telephone Encounter (Signed)
I called and spoke with [name], he started yesterday morning with head congestion and a mild cough.  He said the cough is dry.  He said nasal congestion is mostly stuffy.  He denies any ear fullness or pain, he denies any pain under eyes or dental pain.  He took 2 mucinex tablets this morning, unsure of the strength.  He denies any fever/chills, shortness of breath, pharyngitis, post nasal drip.  He has some flonase in the house but has not been using it.  He stayed home from work today.  He states he has MS so wants to be cautious.  He is asking for recommendations for this.  For the first several days, nasal saline or flonase, fluids, rest, mucinex?  Please advise

## 2018-12-21 NOTE — Telephone Encounter (Signed)
I called [name] and reviewed, he asked I send it in my chart too.

## 2018-12-21 NOTE — Telephone Encounter (Signed)
Patient calling with URI symptoms:    SOB - No  Fever - No  Duration - since yesterday morning  Congestion - head cold  Cough - a little bit  Sore throat - No  Taking anything for it - Mucinex    [name] can be reached at cell # [phone].

## 2018-12-21 NOTE — Telephone Encounter (Signed)
Perfect thanks

## 2018-12-22 ENCOUNTER — Telehealth: Payer: Self-pay | Admitting: Primary Care

## 2018-12-22 NOTE — Telephone Encounter (Signed)
Please let them know of today's guidelines:    "Reserve testing for those with febrile, lower respiratory tract symptoms or flu-like illness."      Thus if no significant fever or breathlessness, chest congestion, should hold off on using these testing kits for those that need them.

## 2018-12-22 NOTE — Telephone Encounter (Signed)
I called [name] and had to leave message with this information, asked him to be in touch with any symptoms of concern

## 2018-12-22 NOTE — Telephone Encounter (Signed)
I called [name] for update, he states things are starting to break up in his head, still no fever.  Nothing in his chest.  He appreciates the checking in.  FYI only

## 2018-12-23 ENCOUNTER — Encounter: Payer: Self-pay | Admitting: Primary Care

## 2018-12-23 MED ORDER — BENZONATATE 200 MG PO CAPS *I*
200.0000 mg | ORAL_CAPSULE | Freq: Three times a day (TID) | ORAL | 1 refills | Status: DC | PRN
Start: 2018-12-23 — End: 2019-01-09

## 2018-12-23 NOTE — Telephone Encounter (Signed)
I called [name], offered benzonatate to try and then delsym at night.  He asked this be sent to him as instructions in my chart also. I told him I would.  Please see pended rx.  Thanks.  He will send or call with update on Monday

## 2018-12-23 NOTE — Telephone Encounter (Signed)
Does he want benzonatate for dry cough?  Delsym at night?  Still sound viral and non-COVID.

## 2018-12-23 NOTE — Telephone Encounter (Signed)
I spoke with [name], he said the cough is still dry, he has some laryngitis now also.  He said his throat is a little sore also.  He has some nasal congestion still, used netti pot and it loosened things a little.  He continues with the mucinex twice daily.  He is staying well hydrated.  He continues to deny fever/chills and shortness of breath.

## 2018-12-24 ENCOUNTER — Encounter: Payer: Self-pay | Admitting: Primary Care

## 2018-12-24 MED ORDER — AMOXICILLIN-POT CLAVULANATE 875-125 MG PO TABS *I*
1.0000 | ORAL_TABLET | Freq: Two times a day (BID) | ORAL | 0 refills | Status: AC
Start: 2018-12-24 — End: 2019-01-03

## 2018-12-25 ENCOUNTER — Telehealth: Payer: Self-pay

## 2018-12-25 NOTE — Telephone Encounter (Signed)
Ortho Nurse Navigator Initial Intake Assessment Interview 12/06/18  Met with pt    Yes          Support person name  Spouse and dtr.           present today   No        BMI  23.9       Chief Complaint   R knee pain and OA                                          Surgery Type  R TKR      OR Date 06/29/19  Barriers to OR date : Yes clinical decline with medical issues  Allergies mold and environmental   PMH   DM   No               A1c  5.0  Hx thoracic aortic aneurysm, LUTS  HLD RBBB Multiple sclerosis        Specialist to see prior to surgery Yes     Urology Neurology cardiology                                     D/C PLAN   Home  Yes                             Home Situation  2   Story,   # 1  Steps to get in home with rail   No     Plan to set up first/main floor set up with recliner, sofa or bed Yes      With bathroom  Yes                            .    PT EDUCATION pre, peri op, post op TJR information      Reviewed exercises Yes   provided a copy of exercises to start at home     PLAN provided to pt   pt will set up PCP Yes    PRAT to call pt with appt  Yes    PRIOR TJR class attendance     No    1:1  updates provided  No    Pt will set up Attendance class  Yes with  Scheduling secty to call to pt. and confirm                                              Gerilyn Pilgrim, RN  Nurse Navigator, [facility]

## 2019-01-05 ENCOUNTER — Encounter: Payer: Self-pay | Admitting: Primary Care

## 2019-01-06 ENCOUNTER — Encounter: Payer: Self-pay | Admitting: Primary Care

## 2019-01-09 ENCOUNTER — Other Ambulatory Visit: Payer: Self-pay | Admitting: Primary Care

## 2019-03-08 ENCOUNTER — Other Ambulatory Visit: Payer: Self-pay | Admitting: Neurology

## 2019-03-08 DIAGNOSIS — G35 Multiple sclerosis: Secondary | ICD-10-CM

## 2019-03-08 NOTE — Telephone Encounter (Signed)
Update Care Everywhere    Date of last visit and plan for FUV: 12/06/2018, 6-12 months  Date of FUV? Not scheduled  Date of last MRI and plan for next: 01/27/2017, 11/20  Is MRI ordered? Yes  DMT: Tecfidera  Date of most recent 11/19/2018 GFR AA/NAA (Ampyra only): 92/79   ALT: 25   AST: 31 08/11/2018  WBC: 5.2   ALC: 1.1

## 2019-03-09 MED ORDER — DALFAMPRIDINE 10 MG PO TB12 *I*
10.0000 mg | ORAL_TABLET | Freq: Two times a day (BID) | ORAL | 5 refills | Status: DC
Start: 2019-03-09 — End: 2019-09-06

## 2019-03-13 ENCOUNTER — Encounter: Payer: Self-pay | Admitting: Gastroenterology

## 2019-03-20 ENCOUNTER — Telehealth: Payer: Self-pay

## 2019-03-20 NOTE — Telephone Encounter (Signed)
Ortho Nurse Navigator Initial Intake Assessment Interview  Met with pt  yes via phone interview       Must have support person for d/c  to home   yes             present today yes wife  Employed yes  Insurance changed in last year? no     D/C PLAN   Home  YES  has equipment cane no walker commode chair                         Home Situation   2  Story,   #  1 Steps to get in home with rail   Yes     Plan to set up first/main floor set up with recliner, sofa or bed Yes      With bathroom  Yes  first floor  Home care agency  [place]  Chief Complaint  R knee pain and decreased mobility                                           Surgery Type  R TKR      OR Date 07/31/19  Barriers to OR date : No  DM   No               A1c     BMI 23.90        Specialist to see prior to surgery   Cardiology neurology and urology. Pt reports all contacted and were ok with pursuing surgery for knee. I reviewed with him that no notes indicated his updating doctors. So he will remind them when he sees them to document    PT EDUCATION pre, peri op, post op TJR information    Reviewed exercises Yes   provided link to ortho webpage exercises  book   pt will set up PCP Yes   PRAT to call pt Yes    Pt will set up watching class online    PRIOR TJR class attendance     No      1:1  updates provided  Yes reviewed below but these directions may change by October.     Please self isolate prior to surgery   Testing to be done at [address] [facility] [address] up COVID 19 nasal testing  Reviewed Keeping to keep Patients safe staff wearing PPE   You will receive a mask on entering hospital please where when staff enter your room or you are out in hallway.  Staff will be wearing full PPE with your care.  Your family will drop you off at front circle, no one can stay with you in the hospital. Your surgeon will call the primary person after surgery is done.                                             Gerilyn Pilgrim, RN    Nurse Navigator, [facility]

## 2019-04-05 ENCOUNTER — Encounter: Payer: Self-pay | Admitting: Primary Care

## 2019-04-20 ENCOUNTER — Other Ambulatory Visit: Payer: Self-pay | Admitting: Urology

## 2019-05-03 ENCOUNTER — Encounter: Payer: Self-pay | Admitting: Primary Care

## 2019-05-04 ENCOUNTER — Encounter: Payer: Self-pay | Admitting: Primary Care

## 2019-05-04 NOTE — Telephone Encounter (Signed)
I have no quick answer here.  He'll need a visit?

## 2019-05-04 NOTE — Telephone Encounter (Signed)
My chart message sent to patient asking him to call and make an appt

## 2019-05-04 NOTE — Telephone Encounter (Signed)
Patient called and made appt for 05/17/19.

## 2019-05-15 ENCOUNTER — Encounter: Payer: Self-pay | Admitting: Urology

## 2019-05-16 ENCOUNTER — Encounter: Payer: Self-pay | Admitting: Urology

## 2019-05-16 ENCOUNTER — Ambulatory Visit: Payer: Medicare (Managed Care) | Admitting: Urology

## 2019-05-16 VITALS — Ht 71.0 in | Wt 160.0 lb

## 2019-05-16 DIAGNOSIS — N529 Male erectile dysfunction, unspecified: Secondary | ICD-10-CM

## 2019-05-16 DIAGNOSIS — N401 Enlarged prostate with lower urinary tract symptoms: Secondary | ICD-10-CM

## 2019-05-16 DIAGNOSIS — R339 Retention of urine, unspecified: Secondary | ICD-10-CM

## 2019-05-16 LAB — POCT BLADDER SCAN PVR

## 2019-05-16 NOTE — Progress Notes (Signed)
Visit Diagnosis(es) or CC: BPH, ED    HPI:  The patient comes to the office today in follow up for the above diagnosis (-es).   BPH  He is voiding with a good urinary stream.    He denies hesitancy.    He denies urgency.    He denies unusual diurnal urinary frequency.  He denies intermittency.  He reports no dysuria or hematuria.  He has on average 4 x nocturia.    He is getting up because of leg cramps and voids because he is awake.  He is remaining satisfied with his voiding.  He is currently using Cialis 5 once daily only.  He had tried alfuzosin and possibly tamsulosin in the past.   ED  He is using combination Cialis for erections.    He is using Cialis 5 daily only but not Cialis 20 PRN.  He is content with this for now at least.  MUSE is not covered.  He had tried Viagra a while ago, which was insufficient.  His T level was excellent.        PMH:    No changes reported    PSH:  Will have a Right TKA on 07-31-2019    FH:  No changes reported    SH:  No changes reported    ROS:      Systemic: Denies recent weight loss, weight gain, or fatigue.  Eyes: Denies change in vision.  ENT: Denies hearing problems, or loss. Denies swollen glands or stiff neck.  Chest: Denies recent cold, flu or upper respiratory illness. Denies cough or dyspnea.  Heart: Denies recent "heart trouble," chest pain, or palpitations.  GI: Denies constipation, diarrhea, acid reflux or bloody stool.  GU: Denies urogenital complaints, except as outlined in the HPI.  Musculoskeletal:  Worsening right knee pain.   Leg cramps at night.       Objective:    GENERAL:  No acute distress.  Well developed, well nourished.  NEUROLOGIC:  Oriented to person, place, time, and situation  PSYCHIATRIC:  Normal mood and affect  HEENT:  NC/AT.  Sclerae anicteric.  Nose/mouth/ears without obvious lesions.  NECK:  Trachea midline.  No JVD in sitting position.    SKIN:  Normal color, turgor, texture, hydration  LUNGS:  Respirations unlabored.    HEART:  Regular rate  and rhythm  BACK:  No CVAT  ABD:  Soft, NT/ND, no masses or bladder distention  EXTR:  No calf swelling or tenderness  GENITALIA:  Penis - Normal, circumcised.  Urethral meatus normal.  Scrotum - normal, without lesions.     No intrascrotal masses appreciated.    No evidence of a spermatocele on either side    No evidence of a hydrocele on either side  Testes:   Right - normal in size, shape, consistency, orientation.  No masses evident.   Left - normal in size, shape, consistency, orientation.  No masses evident.  Vasa:   Right - present and normal   Left - present and normal   Epididymides:   Right - present and normal   Left - present and normal    Prostate:   1+ enlarged and benign feeling - unchanged.      Soft, smooth, symmetric, sharp, distinct borders, without induration or nodularity.    Unchanged.  Rectum:   No evidence of mass or blood.  Anal sphincter tone:   Normal  Bulbocavernosus reflex:    Normal    Femoral Pulses:  0-1+ bilaterally      PVR:   21 ccs    CF 77 ccs (Last void was at 0715, or 4 hours ago)    CF 12 ccs    CF 19 ccs    LABORATORY DATA (If obtained for this visit):  Results for NAY, KILLINGER (MRN [id number]) as of 04/26/2018 11:02   Ref. Range 09/08/2010 07:30 11/17/2012 08:22 10/19/2016 08:32 11/15/2017 08:46   PSA (eff. 01-2009) Latest Ref Range: 0.00 - 4.00 ng/mL 0.58 0.58 0.96 1.01       Results for TAMARION, PETRONIO (MRN [id number]) as of 03/16/2017 09:44   Ref. Range 01/26/2017 09:54   Testosterone Latest Ref Range: 193 - 740 ng/dL 660   Testosterone,Free Latest Ref Range: 47 - 244 pg/mL 51   Testosterone,% Free Latest Units: % 1   Sex Hormone Binding Glob Latest Ref Range: 10 - 80 nmol/L 70         ASSESSMENT:   BPH    Well compensated at present with once daily Cialis 5 only    LUTS minimal    Gland size good    PVR good    Nocturia not likely secondary to BPY    ED    Cialis 5 daily along with Cialis 20 PRN is proving to be sufficient       PLAN:    BPH    Continue Cialis 5 daily    Check  PSA   ED    Continue Cialis 5 daily and Cialis 20 PRN      FOLLOW-UP:   Here in one year    LOS:      [address], [address]  CY    Time spent in counseling and coordination of care (if used for LOS):  15 minute face-to-face visit with >75% of that spent in counseling and coordination of care.

## 2019-05-17 ENCOUNTER — Encounter: Payer: Self-pay | Admitting: Urology

## 2019-05-17 ENCOUNTER — Ambulatory Visit: Payer: Medicare (Managed Care) | Admitting: Primary Care

## 2019-05-17 ENCOUNTER — Encounter: Payer: Self-pay | Admitting: Primary Care

## 2019-05-17 VITALS — BP 110/68 | HR 68 | Temp 97.4°F | Wt 164.4 lb

## 2019-05-17 DIAGNOSIS — R252 Cramp and spasm: Secondary | ICD-10-CM

## 2019-05-17 DIAGNOSIS — G35 Multiple sclerosis: Secondary | ICD-10-CM

## 2019-05-17 LAB — PCMH FALL RISK ASSESSMENT

## 2019-05-17 LAB — PCMH DEPRESSION ASSESSMENT

## 2019-05-17 LAB — PCMH FALL RISK PLAN

## 2019-05-17 NOTE — Patient Instructions (Signed)
Update bloodwork.  Read the article.  Be sure to maintain very good hydration.  Consider 5 min on the bike in the 30 min prior to bedtime.  Consider diphenhydramine (Benadryl) at 25 mg dose?  I'll be in touch with lab results, any recommendations there.    Can consider trial of diltiazem, a calcium channel blocker, if all else fails.

## 2019-05-19 ENCOUNTER — Other Ambulatory Visit
Admission: RE | Admit: 2019-05-19 | Discharge: 2019-05-19 | Disposition: A | Discharge status: Home / Self Care | Payer: Medicare (Managed Care) | Source: Ambulatory Visit | Attending: Primary Care | Admitting: Primary Care

## 2019-05-19 DIAGNOSIS — E785 Hyperlipidemia, unspecified: Secondary | ICD-10-CM | POA: Insufficient documentation

## 2019-05-19 DIAGNOSIS — R252 Cramp and spasm: Secondary | ICD-10-CM | POA: Insufficient documentation

## 2019-05-19 DIAGNOSIS — N401 Enlarged prostate with lower urinary tract symptoms: Secondary | ICD-10-CM

## 2019-05-19 DIAGNOSIS — G35 Multiple sclerosis: Secondary | ICD-10-CM

## 2019-05-19 LAB — COMPREHENSIVE METABOLIC PANEL
ALT: 40 U/L (ref 0–50)
AST: 38 U/L (ref 0–50)
Albumin: 4.4 g/dL (ref 3.5–5.2)
Alk Phos: 59 U/L (ref 40–130)
Anion Gap: 10 (ref 7–16)
Bilirubin,Total: 0.6 mg/dL (ref 0.0–1.2)
CO2: 27 mmol/L (ref 20–28)
Calcium: 9.4 mg/dL (ref 8.6–10.2)
Chloride: 101 mmol/L (ref 96–108)
Creatinine: 0.99 mg/dL (ref 0.67–1.17)
GFR,Black: 89 *
GFR,Caucasian: 77 *
Glucose: 91 mg/dL (ref 60–99)
Lab: 16 mg/dL (ref 6–20)
Potassium: 4.2 mmol/L (ref 3.3–5.1)
Sodium: 138 mmol/L (ref 133–145)
Total Protein: 6.3 g/dL (ref 6.3–7.7)

## 2019-05-19 LAB — CBC AND DIFFERENTIAL
Baso # K/uL: 0 10*3/uL (ref 0.0–0.1)
Basophil %: 0.7 %
Eos # K/uL: 0.1 10*3/uL (ref 0.0–0.5)
Eosinophil %: 2 %
Hematocrit: 44 % (ref 40–51)
Hemoglobin: 15.1 g/dL (ref 13.7–17.5)
IMM Granulocytes #: 0 10*3/uL (ref 0.0–0.0)
IMM Granulocytes: 0.5 %
Lymph # K/uL: 1.4 10*3/uL (ref 1.3–3.6)
Lymphocyte %: 22.5 %
MCH: 31 pg/cell (ref 26–32)
MCHC: 34 g/dL (ref 32–37)
MCV: 90 fL (ref 79–92)
Mono # K/uL: 0.6 10*3/uL (ref 0.3–0.8)
Monocyte %: 10.3 %
Neut # K/uL: 3.9 10*3/uL (ref 1.8–5.4)
Nucl RBC # K/uL: 0 10*3/uL (ref 0.0–0.0)
Nucl RBC %: 0 /100 WBC (ref 0.0–0.2)
Platelets: 198 10*3/uL (ref 150–330)
RBC: 4.9 MIL/uL (ref 4.6–6.1)
RDW: 12.6 % (ref 11.6–14.4)
Seg Neut %: 64 %
WBC: 6.1 10*3/uL (ref 4.2–9.1)

## 2019-05-19 LAB — PSA (EFF.4-2010): PSA (eff. 4-2010): 0.84 ng/mL (ref 0.00–4.00)

## 2019-05-19 LAB — LIPID PANEL
Chol/HDL Ratio: 3.4
Cholesterol: 190 mg/dL
HDL: 56 mg/dL (ref 40–60)
LDL Calculated: 113 mg/dL
Non HDL Cholesterol: 134 mg/dL
Triglycerides: 107 mg/dL

## 2019-05-19 LAB — MULTIPLE ORDERING DOCS

## 2019-05-19 LAB — TSH: TSH: 3.91 u[IU]/mL (ref 0.27–4.20)

## 2019-05-19 LAB — MAGNESIUM: Magnesium: 2.1 mg/dL (ref 1.6–2.5)

## 2019-05-19 LAB — VITAMIN B12: Vitamin B12: 1391 pg/mL — ABNORMAL HIGH (ref 232–1245)

## 2019-05-19 LAB — VITAMIN D: 25-OH Vit Total: 51 ng/mL (ref 30–60)

## 2019-05-19 LAB — BILIRUBIN, DIRECT: Bilirubin,Direct: <0.2 mg/dL (ref 0.0–0.3)

## 2019-05-20 ENCOUNTER — Encounter: Payer: Self-pay | Admitting: Primary Care

## 2019-05-21 ENCOUNTER — Encounter: Payer: Self-pay | Admitting: Primary Care

## 2019-05-21 NOTE — Progress Notes (Signed)
Subjective:     Patient ID: Lucio Litsey is a 68 y.o. male.    HPI  Longstanding multiple sclerosis, stable and well compensated.  Here today with bothersome leg/foot cramps, wonderin why now?  Not playing his usual tennis, perhaps less stretching, activity, hydration?  No new supplements, meds or diet changes.    Patient's medications, allergies, past medical, surgical, social and family histories were reviewed and updated as appropriate.    Review of Systems  No other muscle irritability  No heat/cold intolerance  No fevers or night sweats  No cough or dyspnea      Objective:   Physical Exam  NAD  No tremor  No fasciulations  Normal tone  No weakness evident  Normal pulses in feet       Assessment:      No particular clues, and discussed general approach, provided UpToDate review for his perusal.  We agreed to plan as below.       Plan:      Update bloodwork.  Read the article.  Be sure to maintain very good hydration.  Consider 5 min on the bike in the 30 min prior to bedtime.  Consider diphenhydramine (Benadryl) at 25 mg dose?  I'll be in touch with lab results, any recommendations there.    Can consider trial of diltiazem, a calcium channel blocker, if all else fails.

## 2019-05-29 ENCOUNTER — Other Ambulatory Visit: Payer: Self-pay | Admitting: Neurology

## 2019-05-29 ENCOUNTER — Ambulatory Visit: Payer: Medicare (Managed Care) | Admitting: Primary Care

## 2019-05-29 DIAGNOSIS — G35 Multiple sclerosis: Secondary | ICD-10-CM

## 2019-05-29 NOTE — Telephone Encounter (Signed)
Refill request

## 2019-05-30 NOTE — Telephone Encounter (Signed)
Date of last visit and plan for FUV: 12/06/18 - 6/12 months  Date of FUV?  Not yet scheduled  Date of last MRI and plan for next: 01/2017 - 08/2019  Is MRI ordered? Yes not yet scheduled  DMT (include alternate dosing if applicable):Tecfidera  Labs:      Lab results: 05/19/19  0803   Sodium 138   Potassium 4.2   Chloride 101   CO2 27   UN 16   Creatinine 0.99   GFR,Caucasian 77   GFR,Black 89   Glucose 91   Calcium 9.4             Lab results: 05/19/19  0803   Total Protein 6.3   Albumin 4.4   ALT 40   AST 38   Alk Phos 59   Bilirubin,Total 0.6   Bilirubin,Direct <0.2           Lab results: 05/19/19  0803   WBC 6.1   Hemoglobin 15.1   Hematocrit 44   RBC 4.9   Platelets 198   Neut # K/uL 3.9   Lymph # K/uL 1.4   Mono # K/uL 0.6   Eos # K/uL 0.1   Baso # K/uL 0.0   Seg Neut % 64.0   Lymphocyte % 22.5   Monocyte % 10.3   Eosinophil % 2.0   Basophil % 0.7

## 2019-05-31 ENCOUNTER — Encounter: Payer: Self-pay | Admitting: Orthopedic Surgery

## 2019-05-31 MED ORDER — DIMETHYL FUMARATE 240 MG PO CPDR *I*
240.0000 mg | DELAYED_RELEASE_CAPSULE | Freq: Two times a day (BID) | ORAL | 1 refills | Status: DC
Start: 2019-05-31 — End: 2020-01-10

## 2019-06-03 ENCOUNTER — Encounter: Payer: Self-pay | Admitting: Orthopedic Surgery

## 2019-06-11 ENCOUNTER — Encounter: Payer: Self-pay | Admitting: Neurology

## 2019-06-11 DIAGNOSIS — R202 Paresthesia of skin: Secondary | ICD-10-CM

## 2019-06-13 NOTE — Telephone Encounter (Signed)
Pt with c/o left foot burning x 4 weeks.  Unrelieved with rest/cooling.  Denies recent illness or symptoms of UTI.  Has been on gabapentin in the past.  Prescription added.  Please advise.

## 2019-06-14 MED ORDER — GABAPENTIN 300 MG PO CAPSULE *I*
ORAL_CAPSULE | ORAL | 5 refills | Status: DC
Start: 2019-06-14 — End: 2019-11-28

## 2019-06-28 ENCOUNTER — Encounter: Payer: Self-pay | Admitting: Orthopedic Surgery

## 2019-06-28 ENCOUNTER — Ambulatory Visit: Payer: Medicare (Managed Care) | Admitting: Orthopedic Surgery

## 2019-06-28 ENCOUNTER — Ambulatory Visit
Admission: RE | Admit: 2019-06-28 | Discharge: 2019-06-28 | Disposition: A | Discharge status: Home / Self Care | Payer: Medicare (Managed Care) | Source: Ambulatory Visit

## 2019-06-28 VITALS — BP 111/68 | HR 68 | Ht 71.0 in | Wt 164.0 lb

## 2019-06-28 DIAGNOSIS — M170 Bilateral primary osteoarthritis of knee: Secondary | ICD-10-CM

## 2019-06-28 DIAGNOSIS — M25562 Pain in left knee: Secondary | ICD-10-CM

## 2019-06-28 DIAGNOSIS — M1712 Unilateral primary osteoarthritis, left knee: Secondary | ICD-10-CM

## 2019-06-29 MED ORDER — LIDOCAINE HCL 1 % IJ SOLN *I*
1.5000 mL | Freq: Once | INTRAMUSCULAR | Status: AC | PRN
Start: 2019-06-29 — End: 2019-06-29
  Administered 2019-06-29: 10:00:00 1.5 mL via INTRA_ARTICULAR

## 2019-06-29 MED ORDER — METHYLPREDNISOLONE ACETATE 40 MG/ML IJ SUSP *I*
40.0000 mg | Freq: Once | INTRAMUSCULAR | Status: AC | PRN
Start: 2019-06-29 — End: 2019-06-29
  Administered 2019-06-29: 40 mg via INTRA_ARTICULAR

## 2019-06-29 MED ORDER — ETHYL CHLORIDE EX AERO (MULTIPLE PATIENTS) *I*
1.0000 | INHALATION_SPRAY | Freq: Once | CUTANEOUS | Status: AC | PRN
Start: 2019-06-29 — End: 2019-06-29
  Administered 2019-06-29: 1 via TOPICAL

## 2019-06-29 NOTE — Progress Notes (Signed)
Subjective: [name] presents with now left knee pain over the last several weeks, denies specific injury, medial based pain dull, worse with activity    ROS: No chest pain, SOB, fever, chills, skin erythema, all remaining systems negative.    Objective:  Constitutional: NAD, comfortable  Psychiatric: Normal affect  Musculoskeletal:  Left knee: Ambulating heel - toe with a non-antalgic gait, soft tissue envelope intact, no appreciable effusion, ROM 0-125, mild crepitus, no coronal or sagittal plane instability, neurocirculatroy exam within normal limits.    Radiographs: Severe left knee arthrosis with bone on bone contact, osteopenic bone appearance    Impression:  1.  Left knee OA - severe    Plan:  Discussed ongoing conservative care, offered and received left knee injection. Scheduled for right TKA October.     Answers for HPI/ROS submitted by the patient on 06/26/2019   LEFT KNEE PAIN HPI  What is your goals for today's visit?: CHECK LEFT KNEE  Date of onset: : 05/26/2019  Was this the result of an injury?: No  What is your pain level?: 4/10  Please describe the quality of your pain: : discomfort  What diagnostic workup have you had for this condition?: no prior workup  What treatments have you tried for this condition?: acetominophen  Progression since onset: : gradually worsening  Is this a work related condition? : No  Current work status: : usual activities  Fever: No  Chills: No  Numbness: No  Tingling: Yes

## 2019-06-29 NOTE — Procedures (Signed)
Large Joint Aspiration/Injection Procedure: L knee intra - articular    Date/Time: 06/28/2019  2:30 PM EDT  Consent given by: patient  Site marked: site marked  Timeout: Immediately prior to procedure a time out was called to verify the correct patient, procedure, equipment, support staff and site/side marked as required     Procedure Details    Location: knee - L knee intra - articular  Preparation: The site was prepped using the usual aseptic technique.  Anesthetics administered: 1 spray ethyl chloride; 1.5 mL lidocaine hcl 1 %  Intra-Articular Steroids administered: 40 mg methylPREDNISolone acetate 40 MG/ML  Dressing:  A dry, sterile dressing was applied.  Patient tolerance: patient tolerated the procedure well with no immediate complications

## 2019-06-30 NOTE — Progress Notes (Signed)
Pre-Visit Planning    Health Maintenance Due   Topic Date Due    HIV Screening USPSTF/[place]  02/27/1964    IMM-INFLUENZA (1) 06/13/2019       Notes:  -      Completed on 06/30/19 by Corlis Hove

## 2019-07-03 ENCOUNTER — Ambulatory Visit: Payer: Medicare (Managed Care) | Admitting: Primary Care

## 2019-07-03 ENCOUNTER — Encounter: Payer: Self-pay | Admitting: Primary Care

## 2019-07-03 VITALS — BP 122/68 | HR 60 | Temp 97.4°F | Wt 166.0 lb

## 2019-07-03 DIAGNOSIS — Z23 Encounter for immunization: Secondary | ICD-10-CM

## 2019-07-03 DIAGNOSIS — G35 Multiple sclerosis: Secondary | ICD-10-CM

## 2019-07-03 DIAGNOSIS — Z01818 Encounter for other preprocedural examination: Secondary | ICD-10-CM

## 2019-07-03 DIAGNOSIS — I712 Thoracic aortic aneurysm, without rupture, unspecified: Secondary | ICD-10-CM

## 2019-07-03 DIAGNOSIS — M1711 Unilateral primary osteoarthritis, right knee: Secondary | ICD-10-CM

## 2019-07-03 LAB — PCMH FALL RISK PLAN

## 2019-07-03 LAB — PCMH DEPRESSION ASSESSMENT

## 2019-07-03 LAB — PCMH FALL RISK ASSESSMENT

## 2019-07-09 ENCOUNTER — Encounter: Payer: Self-pay | Admitting: Primary Care

## 2019-07-09 NOTE — Preop H&P (Signed)
AMBULATORY  Chief Complaint:   Chief Complaint   Patient presents with    Pre-op Exam     10/19 Dr Manuela Neptune knee       History of Present Illness:  HPI  [name] is here for PCP medical "clearance" prior to planned R knee arthroplasty in mid OCT.    Multiple sclerosis has been stable and well compensated, playing tennis up until recently.    Cardiologist is comfortable with surgical plan, feeling that mild LVH and aortic valve insufficiency are well compensated and relatively stable.    [name]'s airway reactivity is similarly very well compensated with current approach, with no new cough, dyspnea, sore throat, fevers, loss of smell, taste.  COVID testing planned preop.    There is no personal or family history of bleeding or clotting abnormalities, anesthesia complications.    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      Environmental allergies 05.25.18    HLD (hyperlipidemia)     Multiple sclerosis     Psoriasis     Tic disorder      Past Surgical History:   Procedure Laterality Date    COLONOSCOPY      HERNIA REPAIR      HX TONSILLECTOMY/ADENOIDECTOMY      INCISIONAL HERNIA REPAIR      KNEE ARTHROSCOPY Right     OTHER SURGICAL HISTORY      MS    VASECTOMY       Family History   Problem Relation Age of Onset    Heart Disease Mother         coronary art disease, not premature    Cancer Father         multiple myeloma    Cancer Maternal Grandmother         unknown    Heart Disease Brother         aneurism-07-09-18    Multiple Sclerosis Neg Hx     Lupus Neg Hx     Rheum arthritis Neg Hx     Thyroid disease Neg Hx     Diabetes Neg Hx     Colon cancer Neg Hx     Colon polyps Neg Hx     High Blood Pressure Neg Hx      Social History     Socioeconomic History    Marital  status: Married     Spouse name: Not on file    Number of children: Not on file    Years of education: Not on file    Highest education level: Not on file   Tobacco Use    Smoking status: Never Smoker    Smokeless tobacco: Never Used   Substance and Sexual Activity    Alcohol use: Never     Comment: Very occasionaly    Drug use: Yes     Frequency: 2.0 times per week     Types: Marijuana    Sexual activity: Never   Other Topics Concern    Not on file   Social History Narrative    Engineer, 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 Has 2 older children.        Exercise -- doubles tennis or pickle ball up to 5 times weekly,  joined [employer] and plans to add some Copywriter, advertising.        Diet -- varied, more lean proteins, complex carbs, not a lot of dessert, some greens.  Protein shakes at bkfst and lunch, flax, seeds, yogurt.        Sleep -- active dreams, variable sleep success.  No GU interruption.        Safety -- seatbelt and no distractions, smoke detectors, sunglasses, plans to be more consistent with skin block.       Allergies:   Allergies   Allergen Reactions    Environmental Allergies     Environmental [Mold]        Medications:  Current Outpatient Medications   Medication    gabapentin (NEURONTIN) 300 MG capsule    dimethyl fumarate (TECFIDERA) 240 mg DR capsule    tadalafil (CIALIS) 5 MG tablet    dalfampridine (AMPYRA) 10 MG tablet    fluticasone-salmeterol (ADVAIR DISKUS) 250-50 MCG/DOSE diskus inhaler    cetirizine (ZYRTEC) 10 MG tablet    fluticasone (FLONASE) 50 MCG/ACT nasal spray    cholecalciferol (VITAMIN D) 1000 UNIT tablet    aqscorbic acid (VITAMIN C) 1000 MG tablet    Cyanocobalamin (VITAMIN B-12 CR PO)    zolpidem (AMBIEN CR) 12.5 MG CR tablet    albuterol (PROVENTIL HFA) 108 (90 BASE) MCG/ACT inhaler     No current facility-administered medications for this visit.         Review of Systems:   ROS  No recent palpitations, lightheadedness, GI upset, altered urinary patterns, skin  rash.    Blood pressure 122/68, pulse 60, temperature 36.3 C (97.4 F), temperature source Temporal, weight 75.3 kg (166 lb).    Physical Exam   Constitutional: He appears well-developed and well-nourished. No distress.   HENT:   Mouth/Throat: Oropharynx is clear and moist.   Eyes: Conjunctivae are normal. No scleral icterus.   Neck: Neck supple. No JVD present. No tracheal deviation present.   Cardiovascular: Normal rate and regular rhythm.   Pulmonary/Chest: Effort normal and breath sounds normal. No stridor. No respiratory distress.   Musculoskeletal:         General: No edema.   Neurological: He is alert.   Skin: He is not diaphoretic.   Psychiatric: He has a normal mood and affect.       Lab Results:    Component      Latest Ref Rng & Units 05/19/2019 11/19/2018   WBC      4.2 - 9.1 THOU/uL 6.1    RBC      4.6 - 6.1 MIL/uL 4.9    Hemoglobin      13.7 - 17.5 g/dL 15.1    Hematocrit      40 - 51 % 44    MCV      79 - 92 fL 90    MCH      26 - 32 pg/cell 31    MCHC      32 - 37 g/dL 34    RDW      11.6 - 14.4 % 12.6    Platelets      150 - 330 THOU/uL 198    Seg Neut %      % 64.0    Lymphocyte %      % 22.5    Monocyte %      % 10.3    Eosinophil %      % 2.0    Basophil %      %  0.7    Neut # K/uL      1.8 - 5.4 THOU/uL 3.9    Lymph # K/uL      1.3 - 3.6 THOU/uL 1.4    Mono # K/uL      0.3 - 0.8 THOU/uL 0.6    Eos # K/uL      0.0 - 0.5 THOU/uL 0.1    Baso # K/uL      0.0 - 0.1 THOU/uL 0.0    Nucl RBC %      0.0 - 0.2 /100 WBC 0.0    Nucl RBC # K/uL      0.0 - 0.0 THOU/uL 0.0    IMM Granulocytes #      0.0 - 0.0 THOU/uL 0.0    IMM Granulocytes      % 0.5    Sodium      133 - 145 mmol/L 138 140   Potassium      3.3 - 5.1 mmol/L 4.2 4.0   Chloride      96 - 108 mmol/L 101 101   CO2      20 - 28 mmol/L 27 29 (H)   Anion Gap      7 - '16 10 10   '$ UN      6 - 20 mg/dL 16 15   Creatinine      0.67 - 1.17 mg/dL 0.99 0.97   GFR,Caucasian      * 77 79   GFR,Black      * 89 92   Glucose      60 - 99 mg/dL 91 89   Calcium       8.6 - 10.2 mg/dL 9.4 9.1   Total Protein      6.3 - 7.7 g/dL 6.3 6.4   Albumin      3.5 - 5.2 g/dL 4.4 4.3   Bilirubin,Total      0.0 - 1.2 mg/dL 0.6 0.7   AST      0 - 50 U/L 38 31   ALT      0 - 50 U/L 40 25   Alk Phos      40 - 130 U/L 59 63   Cholesterol      mg/dL 190 211 (A)   Triglycerides      mg/dL 107 61   HDL Cholesterol      40 - 60 mg/dL 56 65 (H)   LDL Calculated      mg/dL 113 134 (A)   Non HDL Cholesterol      mg/dL 134 146   Chol/HDL Ratio       3.4 3.2   Hemoglobin A1C      %  5.0   Magnesium      1.6 - 2.5 mg/dL 2.1    Vitamin B12      232 - 1,245 pg/mL 1,391 (H)    25-OH Vit Total      30 - 60 ng/mL 51    TSH      0.27 - 4.20 uIU/mL 3.91    PSA (eff. 01-2009)      0.00 - 4.00 ng/mL 0.84    Bilirubin,Direct      0.0 - 0.3 mg/dL <0.2    Radiology impressions (last 30 days):  Knee Arthritis Left 4 Views Right 2 Views    Result Date: 06/28/2019  06/28/2019 2:30 PM 4 VIEWS LEFT KNEE, 2 VIEWS RIGHT KNEE, X-RAYS CLINICAL INFORMATION:  knee pain, M25.562-Pain  in left knee. COMPARISON:  None. PROCEDURE:  4 views of the left knee, 2 views of the right knee. FINDINGS/    Tiny joint effusion is suspected. No acute fracture or dislocation is evident. Early to moderate degenerative changes including narrowing of the medial compartment and beginning tricompartmental osteophytosis are seen. Similar degenerative changes are seen in the right knee. END OF IMPRESSION [facility] submits this DICOM format image data and final report to the [facility], an independent secure electronic health information exchange, on a reciprocally searchable basis (with patient authorization) for a minimum of 12 months after exam date.      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    Plan:     Stay safe between now and surgery.  Proceed.    Author: Ivory Broad, MD  Note created: 07/09/2019  at: 9:12 PM

## 2019-07-10 ENCOUNTER — Ambulatory Visit
Admission: RE | Admit: 2019-07-10 | Discharge: 2019-07-10 | Disposition: A | Discharge status: Home / Self Care | Payer: Medicare (Managed Care) | Source: Ambulatory Visit | Attending: Orthopedic Surgery | Admitting: Orthopedic Surgery

## 2019-07-10 ENCOUNTER — Other Ambulatory Visit: Payer: Self-pay | Admitting: Cardiology

## 2019-07-10 ENCOUNTER — Ambulatory Visit
Admission: RE | Admit: 2019-07-10 | Discharge: 2019-07-10 | Disposition: A | Discharge status: Home / Self Care | Payer: Medicare (Managed Care) | Source: Ambulatory Visit

## 2019-07-10 DIAGNOSIS — M170 Bilateral primary osteoarthritis of knee: Secondary | ICD-10-CM

## 2019-07-10 DIAGNOSIS — Z01810 Encounter for preprocedural cardiovascular examination: Secondary | ICD-10-CM

## 2019-07-10 DIAGNOSIS — Z9109 Other allergy status, other than to drugs and biological substances: Secondary | ICD-10-CM

## 2019-07-10 DIAGNOSIS — F959 Tic disorder, unspecified: Secondary | ICD-10-CM | POA: Insufficient documentation

## 2019-07-10 DIAGNOSIS — Z01818 Encounter for other preprocedural examination: Secondary | ICD-10-CM | POA: Insufficient documentation

## 2019-07-10 DIAGNOSIS — L409 Psoriasis, unspecified: Secondary | ICD-10-CM | POA: Insufficient documentation

## 2019-07-10 LAB — COMPREHENSIVE METABOLIC PANEL
ALT: 28 U/L (ref 0–50)
AST: 41 U/L (ref 0–50)
Albumin: 4.3 g/dL (ref 3.5–5.2)
Alk Phos: 61 U/L (ref 40–130)
Anion Gap: 13 (ref 7–16)
Bilirubin,Total: 0.7 mg/dL (ref 0.0–1.2)
CO2: 24 mmol/L (ref 20–28)
Calcium: 9.5 mg/dL (ref 8.6–10.2)
Chloride: 102 mmol/L (ref 96–108)
Creatinine: 0.9 mg/dL (ref 0.67–1.17)
GFR,Black: 100 *
GFR,Caucasian: 87 *
Glucose: 94 mg/dL (ref 60–99)
Lab: 18 mg/dL (ref 6–20)
Potassium: 4.2 mmol/L (ref 3.3–5.1)
Sodium: 139 mmol/L (ref 133–145)
Total Protein: 6.4 g/dL (ref 6.3–7.7)

## 2019-07-10 LAB — CBC
Hematocrit: 42 % (ref 40–51)
Hemoglobin: 14.4 g/dL (ref 13.7–17.5)
MCH: 30 pg (ref 26–32)
MCHC: 34 g/dL (ref 32–37)
MCV: 88 fL (ref 79–92)
Platelets: 183 10*3/uL (ref 150–330)
RBC: 4.7 MIL/uL (ref 4.6–6.1)
RDW: 12.6 % (ref 11.6–14.4)
WBC: 5.3 10*3/uL (ref 4.2–9.1)

## 2019-07-10 LAB — TYPE AND SCREEN
ABO RH Blood Type: A POS
Antibody Screen: NEGATIVE

## 2019-07-10 LAB — PROTIME-INR
INR: 1.1 (ref 0.9–1.1)
Protime: 13.2 s — ABNORMAL HIGH (ref 10.0–12.9)

## 2019-07-10 LAB — APTT: aPTT: 30.6 s (ref 25.8–37.9)

## 2019-07-10 LAB — ORTHOPEDICS SA NASAL COMPLETE PCR: Orthopedics SA nasal complete PCR: POSITIVE — AB

## 2019-07-10 LAB — ORTHOPEDICS MRSA NASAL COMPLETE PCR: Orthopedics MRSA nasal complete PCR: 0

## 2019-07-10 NOTE — Anesthesia Preprocedure Evaluation (Addendum)
Anesthesia Pre-operative History and Physical for [name]  History and Physical Performed at [facility])  Highlighted Issues for this Procedure:  Right knee total arthroplasty booked for 140 min by Dr. Burnetta Sabin.    Patient reports asthma and MS are well controlled.  Last rescue inhaler use in March 2020.  MS sx include left foot burning pain +/- weakness, loss of balance.  Denies ACS sx, changes in cardiovascular status.    Stress Test/Echocardiography:  TEE 08/19/18    Conclusions    Concentric LVH with normal LV size and systolic function. Aortic valve sclerosis with mild insufficiency, Mitral valve sclerosis with mild regurgitation. The aortic root (4.3cm) and the ascending aorta (4.3 cm)are dilated.    Marland Kitchen  CPM Summary:   [name] presents preoperatively for anesthesia evaluation prior to RIGHT ARTHROPLASTY, KNEE, TOTAL by Dr. Ardine Eng. He  has a past medical history of Asthma, Concentric Left Ventricular Hypertrophy,(LUTS), Environmental allergies, Extrinsic allergic asthma, HLD, Incomplete Right Bundle Branch Block, Multiple sclerosis, Osteoarthritis of right knee, Thoracic aortic aneurysm.  The patient has no dyspnea, denies chest pain, is moderately active, climbs stairs and can lie flat. Pt is limited by their knee pain. Denies any issues with anesthesia. He has clearance from his PCP on 07/09/19. He still needs cardiac clearance.  By Valorie Roosevelt, NP at 10:32 AM on 07/10/2019    Anesthesia Evaluation Information Source: patient, records     ANESTHESIA HISTORY  Pertinent(-):  No History of anesthetic complications or Family hx of anesthetic complications    GENERAL  Pertinent (-):  No infection, history of anesthetic complications or Family Hx of Anesthetic Complications    HEENT    + Visual Impairment          corrective lens for ADL    + Sinus Issues            allergic rhinitis  Pertinent (-):  No hearing loss, nosebleeds or neck pain PULMONARY    + Asthma          allergy induced, inhaler  daily, mild persistent    CARDIOVASCULAR  Good(4+METs) Exercise Tolerance    + Cardiac Testing          echo    + Hx of Dysrhythmias (RBBB)    Comment:  Left Ventricular Hypertrophy    GI/HEPATIC/RENAL    NPO Status  NPO    > 8hrs ago (solids) and > 2hrs ago (clears)    + Alcohol use          social    + Urinary Issues          BPH  Pertinent(-):  No GERD, liver  issues, pancreatic issues, bowel issues or renal issues  NEURO/PSYCH    + Headaches          migraines    + Chronic pain (knees)    + Peripheral Nerve Issue          peripheral neuropathy    + Neuromuscular disease          MS  Pertinent(-):  No seizures, cerebrovascular event, gait/mobility issues or positioning issues    ENDO/OTHER  Pertinent(-):  No diabetes mellitus, thyroid disease, hormone use, steroid use    HEMATOLOGIC    + Blood dyscrasia          hyperlipidemia    + Arthritis          knees  Pertinent(-):  No bruising/bleeding easily, coagulopathy or anticoagulants/antiplatelet medications  Physical Exam    Airway            Mouth opening: normal            Mallampati: I            TM distance (fb): >3 FB            Neck ROM: full            Facial hair: beard, mustache            Airway Impression: easy  Dental   Normal Exam   Cardiovascular  Normal Exam           Rhythm: regular           Rate: normal      Neurologic    Normal Exam    General Survey    Normal Exam   Pulmonary   Normal Exam    breath sounds clear to auscultation    Mental Status   Normal Exam    Operative Site    Normal Exam     ________________________________________________________________________  PLAN  ASA Score  3  Anesthetic Plan spinal     Induction (routine IV) General Anesthesia/Sedation Maintenance Plan (propofol infusion); Airway (nasal cannula); Line ( use current access); Monitoring (standard ASA); Positioning (supine and arms out); PONV Plan (dexamethasone, ondansetron, haloperidol, promethazine, propofol infusion, nonopioid analgesia, no muscle relaxants,  no volatile agents and no N2O); Pain (per surgical team, PO Tylenol, celecoxib and nerve block); PostOp (PACU)    Informed Consent     Risks:         Risks discussed were commensurate with the plan listed above with the following specific points: N/V, sore throat, hypotension, headache, failed block and infection, Damage to: eyes, nerves, teeth and blood vessels, allergic Rx and unexpected serious injury.    Anesthetic Consent:         Anesthetic plan (and risks as noted above) were discussed with patient and spouse    Responsible Anesthesia Attestation:  I attest that the patient or proxy understands and accepts the risks and benefits of the anesthesia plan. I also attest that I have personally performed a pre-anesthetic examination and evaluation, and prescribed the anesthetic plan for this particular location within 48 hours prior to the anesthetic as documented. Donney Dice, MD  07/31/19, 10:02 AM

## 2019-07-10 NOTE — Discharge Instructions (Signed)
Pre-Operative Instructions - Total Joint Replacement                FOLLOW YOUR SURGEON'S INSTRUCTIONS IF DIFFERENT THAN BELOW.                   PRIOR TO SURGERY    Five days before surgery, please STOP taking if surgeon does not specify:     Anti-inflammatory medications (Ibuprofen, Motrin, Advil, Mobic, Meloxicam, Aleve, Naproxen, Voltaren, etc.)      Vitamins and herbal supplements, including herbal teas      YOU MAY TAKE ACETAMINOPHEN (TYLENOL) as needed     Directions regarding your prescribed blood thinners, including aspirin, must be approved by your cardiologist or prescribing doctor.    THREE NIGHTS BEFORE SURGERY FOR INFECTION CONTROL     We ask you to shower for 3 evenings before your surgery using the 4% Chlorhexidine soap the nurse has given you.      Each night take a shower using your normal soap and shampoo.     Afterward, while still in the shower, pour approximately1/3 of the 4% Chlorhexidine soap on a washcloth and wash your body from the neck down. DO NOT wash your head, face, eyes and ears with the Chlorhexidine soap. This soap does not lather. Let the soap sit on your skin for 2 minutes.  Rinse thoroughly.      Dry off with a clean, fresh towel each evening.     Do not apply lotion after your showers. Do not shave below your waist for one week before your surgery.     If you experience itching or redness on application, wash it off and do not use it again. Continue with an over-the-counter antibacterial soap (such as Dial).     After showering, dress in freshly laundered clothes each evening and sleep on clean bedsheets.    ARRIVAL/SURGICAL TIME     Staff from the [facility] will call you between 130PM and 4PM on the Friday (10/16) before surgery to inform you of your arrival and surgery times.     DAY BEFORE SURGERY     Keep yourself well hydrated to aid  in the placement of your IV and for your general well-being.   Do not eat anything after midnight the night before your surgery (including candy or gum).                                                                                         DAY OF SURGERY     DO NOT CONSUME FOOD OF ANY KIND.     Only Gatorade, clear apple juice or water from midnight until 2 hours before your scheduled surgery.     DO NOT WEAR ANY JEWELRY, BODY LOTION OR SCENTS.       Please understand that rings and body piercings need to be removed and left at home. If they are not removed, your surgery is at risk of being delayed or cancelled.      When you come to [place], please try not bring any valuable items with the exception of your phone to keep you  in contact with your family members.  Valuables such as jewelry, cash and credit cards are best left at home during your stay.  Please send them home with family members. If you are alone a staff member will assist you in storing your belongings.     [facility] does not assume responsibility for items brought with you on the day of surgery.     If wearing eyeglasses, please bring a case.  DO NOT WEAR CONTACT LENSES.     You may brush your teeth, shower (with Chlorhexidine soap) and use deodorant.     Patient visitation is restricted at this time due to COVID-19 concerns.      Patients being admitted to the hospital may have one support person present during the preoperative phase of care but will follow the instructions for inpatient visitation after surgery.    SURGICAL SITE INFECTION MEASURES PERFORMED ON THE DAY OF SURGERY:       Neck to toes Chlorhexidine bath-6 prepackaged wipes applied to the skin surface   Nasal Antiseptic-4 swabs used to clean nostrils    Chlorhexidine mouth rinse for 30 seconds    MEDICATIONS: DAY OF SURGERY     Take your medications as directed according to your printed, verbal or MyChart instructions.   Anxiety and pain medications may be taken  as prescribed at any time prior to arrival.    Medications from the [facility] will be administered to you under the direction of your surgeon. Please leave your prescriptions at home with the exception of your inhalers.    AT [place] in the Main Ramp garage.  Enter the building through the [employer].      Please stop at the Information Desk for directions to the [place] on [address].     Leave your belongings in the car. Your visitors may bring them to your room after surgery.    [place]    As a convenience, prior to your discharge we will fill any discharge medications you require.      The pharmacy is open from 9am to 5:30pm on weekdays and 10am-2pm on Saturdays.     If you will be alone on the day of surgery and want to leave with your prescribed medication, you may:   Bring a check made out to [facility]   Use a credit card to pay for your prescription   Have your family call the pharmacy with a credit card number   Only bring cash to the hospital as a last resort. Prescriptions cannot be filled without payment. Thank you for your consideration.    QUESTIONS?     Question about these instructions? Call [phone], select option 2, and leave a message at any time.   A nurse will return your call during our regular business hours.   Any questions regarding specifics about your surgery or recovery? Please call your surgeons office.

## 2019-07-11 LAB — EKG 12-LEAD
P: 27 deg
PR: 223 ms
QRS: 25 deg
QRSD: 110 ms
QT: 498 ms
QTc: 464 ms
Rate: 52 {beats}/min
T: -4 deg

## 2019-07-11 LAB — HEMOGLOBIN A1C: Hemoglobin A1C: 5.1 %

## 2019-07-17 ENCOUNTER — Encounter: Payer: Self-pay | Admitting: Gastroenterology

## 2019-07-24 ENCOUNTER — Other Ambulatory Visit: Payer: Self-pay | Admitting: Orthopedic Surgery

## 2019-07-24 DIAGNOSIS — Z01818 Encounter for other preprocedural examination: Secondary | ICD-10-CM

## 2019-07-25 ENCOUNTER — Encounter: Payer: Self-pay | Admitting: Primary Care

## 2019-07-27 ENCOUNTER — Ambulatory Visit: Payer: Medicare (Managed Care) | Attending: Orthopedic Surgery

## 2019-07-27 DIAGNOSIS — Z01818 Encounter for other preprocedural examination: Secondary | ICD-10-CM

## 2019-07-27 DIAGNOSIS — Z20828 Contact with and (suspected) exposure to other viral communicable diseases: Secondary | ICD-10-CM | POA: Insufficient documentation

## 2019-07-27 DIAGNOSIS — Z01812 Encounter for preprocedural laboratory examination: Secondary | ICD-10-CM | POA: Insufficient documentation

## 2019-07-27 LAB — COVID-19 NAAT (PCR): COVID-19 NAAT (PCR): NEGATIVE

## 2019-07-27 LAB — COVID-19 PCR

## 2019-07-27 NOTE — Comprehensive Assessment (Signed)
07/27/19 [place]   Spokesperson Name Keiron Iodice   Relationship to Patient  Spouse   Phone Number(s) [phone]   Alternate Spokesperson? Yes   Spokesperson Alternate Zoe Noyce   Relationship to Patient  Daughter   Phone Number(s) [phone]   Discharge transportation   Ride to/from facility [facility]   Relationship to Patient  Spouse      07/27/19 1947   Demographics   Religious Beliefs None   [place]   Marital status Married   Ethnicity/Race Caucasian   Primary Language English   Primary Care Taker of? No one   Risk Factors   Risk Factors Adjustment to Dx/Injury/Illness   Health Care Directives Screen (Also required for Hospice Patients)   HCP Name Javiel Canepa   HCP available for inclusion in the chart? Yes   HCP in chart No   *Would they like to discuss any issues related to MOLST, DNR Order, HCP, Living Will, or POA? No   *Health Care Directive teaching done No   Living Situation   Lives With Spouse;Child   Can they assist patient after discharge? Yes   Home Geography   Type of Home 2 Story home   # Of Steps In Home 12   # Steps to Enter Home 1   Bedroom Second floor   Bathroom First floor - half;Second floor - full   Utilitites Working No   Psychosocial   Person assessed Patient   Baseline ADL functioning   Transfers Independent   Ambulation Independent   Assistive Device none   Bathing/Grooming Independent   Meal Prep Independent   Able to feed self? Yes   Household maintenance/chores Independent   Able to drive? 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  No   Is the patient presumed eligible for OPWDD services? No   Home Care Services   Do you currently have home care services? No   Current home  equipment available Walker;Cane;Raised toilet seat;Shower chair;Long-handled shoehorn;Grab bars   SW Home oxygen   Do you have home oxygen? Yes   [place] [facility] Discharge   [place] Complex Discharge No   Time Spent   Time Spent with Patient (min) 15     Telephone call made to pt to complete a prescreen for his upcoming knee surgery on 10.19.20 with Dr Noni Saupe.  Pt has MS however is independent with ADLS, he does not use any assistive device. Pts daughter and wife will care for him post surgery.  Pt given home care options chose [facility].  Email referred to [place] and [employer]. Sw remains available for d.c planning needs.Kristopher Oppenheim, MSW 07/27/19 7:58 PM

## 2019-07-31 ENCOUNTER — Inpatient Hospital Stay: Payer: Medicare (Managed Care)

## 2019-07-31 ENCOUNTER — Other Ambulatory Visit: Payer: Self-pay

## 2019-07-31 ENCOUNTER — Inpatient Hospital Stay: Payer: Medicare (Managed Care) | Admitting: Anesthesiology

## 2019-07-31 ENCOUNTER — Inpatient Hospital Stay: Payer: Medicare (Managed Care) | Admitting: General Surgery

## 2019-07-31 ENCOUNTER — Encounter
Admission: RE | Disposition: A | Discharge status: Home Health | Payer: Self-pay | Source: Ambulatory Visit | Attending: Orthopedic Surgery

## 2019-07-31 ENCOUNTER — Inpatient Hospital Stay
Admission: RE | Admit: 2019-07-31 | Discharge: 2019-08-02 | DRG: 470 | Disposition: A | Discharge status: Home Health | Payer: Medicare (Managed Care) | Source: Ambulatory Visit | Attending: Orthopedic Surgery | Admitting: Orthopedic Surgery

## 2019-07-31 DIAGNOSIS — H547 Unspecified visual loss: Secondary | ICD-10-CM | POA: Diagnosis present

## 2019-07-31 DIAGNOSIS — G35 Multiple sclerosis: Secondary | ICD-10-CM | POA: Diagnosis present

## 2019-07-31 DIAGNOSIS — Z96651 Presence of right artificial knee joint: Secondary | ICD-10-CM

## 2019-07-31 DIAGNOSIS — M1711 Unilateral primary osteoarthritis, right knee: Principal | ICD-10-CM | POA: Diagnosis present

## 2019-07-31 DIAGNOSIS — R339 Retention of urine, unspecified: Secondary | ICD-10-CM | POA: Diagnosis not present

## 2019-07-31 DIAGNOSIS — I517 Cardiomegaly: Secondary | ICD-10-CM | POA: Diagnosis present

## 2019-07-31 DIAGNOSIS — N400 Enlarged prostate without lower urinary tract symptoms: Secondary | ICD-10-CM | POA: Diagnosis present

## 2019-07-31 DIAGNOSIS — R112 Nausea with vomiting, unspecified: Secondary | ICD-10-CM | POA: Diagnosis not present

## 2019-07-31 DIAGNOSIS — R066 Hiccough: Secondary | ICD-10-CM | POA: Diagnosis not present

## 2019-07-31 DIAGNOSIS — G629 Polyneuropathy, unspecified: Secondary | ICD-10-CM | POA: Diagnosis present

## 2019-07-31 DIAGNOSIS — J45998 Other asthma: Secondary | ICD-10-CM | POA: Diagnosis present

## 2019-07-31 DIAGNOSIS — G8918 Other acute postprocedural pain: Secondary | ICD-10-CM

## 2019-07-31 DIAGNOSIS — E785 Hyperlipidemia, unspecified: Secondary | ICD-10-CM | POA: Diagnosis present

## 2019-07-31 DIAGNOSIS — G8929 Other chronic pain: Secondary | ICD-10-CM | POA: Diagnosis present

## 2019-07-31 DIAGNOSIS — Z471 Aftercare following joint replacement surgery: Secondary | ICD-10-CM

## 2019-07-31 DIAGNOSIS — G43909 Migraine, unspecified, not intractable, without status migrainosus: Secondary | ICD-10-CM | POA: Diagnosis present

## 2019-07-31 DIAGNOSIS — J309 Allergic rhinitis, unspecified: Secondary | ICD-10-CM | POA: Diagnosis present

## 2019-07-31 HISTORY — PX: PR ARTHRP KNE CONDYLE&PLATU MEDIAL&LAT COMPARTMENTS: 27447

## 2019-07-31 HISTORY — DX: Presence of right artificial knee joint: Z96.651

## 2019-07-31 LAB — BASIC METABOLIC PANEL
Anion Gap: 12 (ref 7–16)
CO2: 25 mmol/L (ref 20–28)
Calcium: 8.6 mg/dL (ref 8.6–10.2)
Chloride: 103 mmol/L (ref 96–108)
Creatinine: 0.96 mg/dL (ref 0.67–1.17)
GFR,Black: 93 *
GFR,Caucasian: 80 *
Glucose: 189 mg/dL — ABNORMAL HIGH (ref 60–99)
Lab: 16 mg/dL (ref 6–20)
Potassium: 4.2 mmol/L (ref 3.3–5.1)
Sodium: 140 mmol/L (ref 133–145)

## 2019-07-31 LAB — MCHC: MCHC: 35 g/dL (ref 32–37)

## 2019-07-31 LAB — TYPE AND SCREEN
ABO RH Blood Type: A POS
Antibody Screen: NEGATIVE

## 2019-07-31 LAB — HCT AND HGB
Hematocrit: 38 % — ABNORMAL LOW (ref 40–51)
Hemoglobin: 13 g/dL — ABNORMAL LOW (ref 13.7–17.5)

## 2019-07-31 LAB — POCT GLUCOSE: Glucose POCT: 94 mg/dL (ref 60–99)

## 2019-07-31 SURGERY — ARTHROPLASTY, KNEE, TOTAL
Anesthesia: Spinal | Site: Knee | Laterality: Right | Wound class: Clean

## 2019-07-31 MED ORDER — SODIUM CHLORIDE 0.9 % FLUSH FOR PUMPS *I*
0.0000 mL/h | INTRAVENOUS | Status: DC | PRN
Start: 2019-07-31 — End: 2019-07-31

## 2019-07-31 MED ORDER — SODIUM CHLORIDE 0.9 % IV SOLN WRAPPED *I*
20.0000 mL/h | Status: DC
Start: 2019-07-31 — End: 2019-07-31

## 2019-07-31 MED ORDER — OXYCODONE HCL 5 MG PO TABS *I*
5.0000 mg | ORAL_TABLET | ORAL | Status: DC | PRN
Start: 2019-07-31 — End: 2019-08-01
  Administered 2019-07-31: 5 mg via ORAL
  Filled 2019-07-31: qty 1

## 2019-07-31 MED ORDER — MORPHINE SULFATE ER 15 MG PO TBCR *I*
15.0000 mg | ORAL_TABLET | Freq: Two times a day (BID) | ORAL | Status: DC
Start: 2019-07-31 — End: 2019-08-01
  Administered 2019-07-31 – 2019-08-01 (×3): 15 mg via ORAL
  Filled 2019-07-31 (×3): qty 1

## 2019-07-31 MED ORDER — ASPIRIN 81 MG PO TBEC *I*
81.0000 mg | DELAYED_RELEASE_TABLET | Freq: Two times a day (BID) | ORAL | Status: DC
Start: 2019-07-31 — End: 2019-08-02
  Administered 2019-07-31 – 2019-08-02 (×5): 81 mg via ORAL
  Filled 2019-07-31 (×8): qty 1

## 2019-07-31 MED ORDER — KETOROLAC TROMETHAMINE 30 MG/ML IJ SOLN *I*
INTRAMUSCULAR | Status: DC | PRN
Start: 2019-07-31 — End: 2019-07-31
  Administered 2019-07-31: 51 mL via INTRA_ARTICULAR

## 2019-07-31 MED ORDER — LACTATED RINGERS IV SOLN *I*
20.0000 mL/h | INTRAVENOUS | Status: DC
Start: 2019-07-31 — End: 2019-07-31
  Administered 2019-07-31: 20 mL/h via INTRAVENOUS

## 2019-07-31 MED ORDER — ACETAMINOPHEN 500 MG PO TABS *I*
1000.0000 mg | ORAL_TABLET | Freq: Once | ORAL | Status: AC
Start: 2019-07-31 — End: 2019-07-31
  Administered 2019-07-31: 1000 mg via ORAL
  Filled 2019-07-31: qty 2

## 2019-07-31 MED ORDER — CELECOXIB 200 MG PO CAPS *I*
200.0000 mg | ORAL_CAPSULE | Freq: Once | ORAL | Status: AC
Start: 2019-07-31 — End: 2019-07-31
  Administered 2019-07-31: 200 mg via ORAL
  Filled 2019-07-31: qty 1

## 2019-07-31 MED ORDER — DALFAMPRIDINE 10 MG PO TB12 *I*
10.0000 mg | ORAL_TABLET | Freq: Two times a day (BID) | ORAL | Status: DC
Start: 2019-07-31 — End: 2019-08-02
  Administered 2019-07-31 – 2019-08-01 (×2): 10 mg via ORAL

## 2019-07-31 MED ORDER — KETOROLAC TROMETHAMINE 30 MG/ML IJ SOLN *I*
INTRAMUSCULAR | Status: AC
Start: 2019-07-31 — End: 2019-07-31
  Filled 2019-07-31: qty 1

## 2019-07-31 MED ORDER — TRANEXAMIC ACID 1000 MG / 0.7% NACL 100 ML IVPB *I*
1000.0000 mg | Freq: Once | INTRAVENOUS | Status: AC
Start: 2019-07-31 — End: 2019-07-31
  Administered 2019-07-31: 1000 mg via INTRAVENOUS

## 2019-07-31 MED ORDER — DOCUSATE SODIUM 100 MG PO CAPS *I*
200.0000 mg | ORAL_CAPSULE | Freq: Every day | ORAL | Status: DC
Start: 2019-07-31 — End: 2019-08-02
  Administered 2019-07-31 – 2019-08-02 (×3): 200 mg via ORAL
  Filled 2019-07-31 (×3): qty 2

## 2019-07-31 MED ORDER — LACTATED RINGERS IV SOLN *I*
125.0000 mL/h | INTRAVENOUS | Status: DC
Start: 2019-07-31 — End: 2019-07-31
  Administered 2019-07-31: 125 mL/h via INTRAVENOUS

## 2019-07-31 MED ORDER — TAMSULOSIN HCL 0.4 MG PO CAPS *I*
0.4000 mg | ORAL_CAPSULE | Freq: Every evening | ORAL | Status: DC
Start: 2019-07-31 — End: 2019-08-02
  Administered 2019-07-31 – 2019-08-01 (×2): 0.4 mg via ORAL
  Filled 2019-07-31 (×2): qty 1

## 2019-07-31 MED ORDER — LIDOCAINE HCL (PF) 1 % IJ SOLN *I*
0.1000 mL | INTRAMUSCULAR | Status: DC | PRN
Start: 2019-07-31 — End: 2019-07-31
  Administered 2019-07-31: 0.1 mL via SUBCUTANEOUS
  Filled 2019-07-31: qty 2

## 2019-07-31 MED ORDER — DIMETHYL FUMARATE 240 MG PO CPDR *I*
240.0000 mg | DELAYED_RELEASE_CAPSULE | Freq: Two times a day (BID) | ORAL | Status: DC
Start: 2019-07-31 — End: 2019-08-02
  Administered 2019-07-31 – 2019-08-01 (×2): 240 mg via ORAL

## 2019-07-31 MED ORDER — PROMETHAZINE HCL 25 MG/ML IJ SOLN *I*
INTRAMUSCULAR | Status: AC
Start: 2019-07-31 — End: 2019-07-31
  Filled 2019-07-31: qty 1

## 2019-07-31 MED ORDER — BUPIVACAINE HCL 0.25 % IJ SOLUTION *WRAPPED*
Status: AC
Start: 2019-07-31 — End: 2019-07-31
  Filled 2019-07-31: qty 30

## 2019-07-31 MED ORDER — CALCIUM CARBONATE ANTACID 500 MG PO CHEW *I*
1000.0000 mg | CHEWABLE_TABLET | Freq: Three times a day (TID) | ORAL | Status: DC | PRN
Start: 2019-07-31 — End: 2019-08-02

## 2019-07-31 MED ORDER — ALBUTEROL SULFATE (2.5 MG/3ML) 0.083% IN NEBU *I*
2.5000 mg | INHALATION_SOLUTION | Freq: Once | RESPIRATORY_TRACT | Status: DC | PRN
Start: 2019-07-31 — End: 2019-07-31

## 2019-07-31 MED ORDER — SODIUM CHLORIDE 0.9 % INJ (FLUSH) WRAPPED *I*
Status: AC
Start: 2019-07-31 — End: 2019-07-31
  Filled 2019-07-31: qty 10

## 2019-07-31 MED ORDER — TRANEXAMIC ACID 1000 MG / 0.7% NACL 100 ML IVPB *I*
INTRAVENOUS | Status: AC
Start: 2019-07-31 — End: 2019-07-31
  Filled 2019-07-31: qty 100

## 2019-07-31 MED ORDER — SODIUM CHLORIDE 0.9 % IR SOLN *I*
Status: DC | PRN
Start: 2019-07-31 — End: 2019-07-31
  Administered 2019-07-31: 522.5 mL

## 2019-07-31 MED ORDER — ROLLER WALKER MISC *A*
1.0000 [IU] | 0 refills | Status: DC
Start: 2019-07-31 — End: 2019-09-12

## 2019-07-31 MED ORDER — HYDROMORPHONE HCL PF 0.5 MG/0.5 ML IJ SOLN *I*
0.5000 mg | INTRAMUSCULAR | Status: DC | PRN
Start: 2019-07-31 — End: 2019-07-31

## 2019-07-31 MED ORDER — BISACODYL 10 MG RE SUPP *I*
10.0000 mg | Freq: Every day | RECTAL | Status: DC | PRN
Start: 2019-07-31 — End: 2019-08-02
  Filled 2019-07-31: qty 1

## 2019-07-31 MED ORDER — DOCUSATE SODIUM 100 MG PO CAPS *I*
200.0000 mg | ORAL_CAPSULE | Freq: Every day | ORAL | 0 refills | Status: DC
Start: 2019-07-31 — End: 2019-08-15
  Filled 2019-07-31: qty 100, 50d supply, fill #0

## 2019-07-31 MED ORDER — CEFAZOLIN 2000 MG IN STERILE WATER 20ML SYRINGE *I*
2000.0000 mg | PREFILLED_SYRINGE | Freq: Three times a day (TID) | INTRAVENOUS | Status: AC
Start: 2019-07-31 — End: 2019-08-01
  Administered 2019-07-31 – 2019-08-01 (×2): 2000 mg via INTRAVENOUS
  Filled 2019-07-31 (×2): qty 20

## 2019-07-31 MED ORDER — SODIUM CHLORIDE 0.9 % FLUSH FOR PUMPS *I*
0.0000 mL/h | INTRAVENOUS | Status: DC | PRN
Start: 2019-07-31 — End: 2019-08-02

## 2019-07-31 MED ORDER — MIDAZOLAM HCL 1 MG/ML IJ SOLN *I* WRAPPED
INTRAMUSCULAR | Status: DC | PRN
Start: 2019-07-31 — End: 2019-07-31
  Administered 2019-07-31 (×2): 1 mg via INTRAVENOUS
  Administered 2019-07-31: 2 mg via INTRAVENOUS

## 2019-07-31 MED ORDER — ACETAMINOPHEN 500 MG PO TABS *I*
1000.0000 mg | ORAL_TABLET | Freq: Three times a day (TID) | ORAL | Status: DC
Start: 2019-07-31 — End: 2019-08-01
  Administered 2019-07-31 – 2019-08-01 (×4): 1000 mg via ORAL
  Filled 2019-07-31 (×4): qty 2

## 2019-07-31 MED ORDER — DEXAMETHASONE SODIUM PHOSPHATE 4 MG/ML INJ SOLN *WRAPPED*
INTRAMUSCULAR | Status: AC
Start: 2019-07-31 — End: 2019-07-31
  Filled 2019-07-31: qty 2

## 2019-07-31 MED ORDER — DEXTROSE 5 % FLUSH FOR PUMPS *I*
0.0000 mL/h | INTRAVENOUS | Status: DC | PRN
Start: 2019-07-31 — End: 2019-08-02

## 2019-07-31 MED ORDER — BUPIVACAINE IN DEXTROSE 0.75-8.25 % IT SOLN *I*
INTRATHECAL | Status: DC | PRN
Start: 2019-07-31 — End: 2019-07-31
  Administered 2019-07-31: 2 mL via INTRATHECAL

## 2019-07-31 MED ORDER — GABAPENTIN 300 MG PO CAPSULE *I*
300.0000 mg | ORAL_CAPSULE | Freq: Once | ORAL | Status: AC
Start: 2019-07-31 — End: 2019-07-31
  Administered 2019-07-31: 300 mg via ORAL
  Filled 2019-07-31: qty 1

## 2019-07-31 MED ORDER — MIDAZOLAM HCL 1 MG/ML IJ SOLN *I* WRAPPED
INTRAMUSCULAR | Status: AC
Start: 2019-07-31 — End: 2019-07-31
  Filled 2019-07-31: qty 2

## 2019-07-31 MED ORDER — GABAPENTIN 300 MG PO CAPSULE *I*
300.0000 mg | ORAL_CAPSULE | Freq: Every evening | ORAL | Status: DC
Start: 2019-07-31 — End: 2019-08-02
  Administered 2019-07-31 – 2019-08-01 (×2): 300 mg via ORAL
  Filled 2019-07-31 (×2): qty 1

## 2019-07-31 MED ORDER — PROPOFOL 10 MG/ML IV EMUL (INTERMITTENT DOSING) WRAPPED *I*
INTRAVENOUS | Status: DC | PRN
Start: 2019-07-31 — End: 2019-07-31
  Administered 2019-07-31: 30 mg via INTRAVENOUS

## 2019-07-31 MED ORDER — POLYETHYLENE GLYCOL 3350 PO PACK 17 GM *I*
17.0000 g | PACK | Freq: Every day | ORAL | Status: DC
Start: 2019-07-31 — End: 2019-08-02
  Administered 2019-07-31 – 2019-08-02 (×3): 17 g via ORAL
  Filled 2019-07-31 (×4): qty 17

## 2019-07-31 MED ORDER — CEFAZOLIN 2000 MG IN STERILE WATER 20ML SYRINGE *I*
2000.0000 mg | PREFILLED_SYRINGE | Freq: Once | INTRAVENOUS | Status: DC
Start: 2019-07-31 — End: 2019-07-31
  Filled 2019-07-31: qty 20

## 2019-07-31 MED ORDER — OXYCODONE HCL 5 MG PO TABS *I*
10.0000 mg | ORAL_TABLET | ORAL | Status: DC | PRN
Start: 2019-07-31 — End: 2019-08-01
  Administered 2019-07-31 – 2019-08-01 (×5): 10 mg via ORAL
  Filled 2019-07-31 (×6): qty 2

## 2019-07-31 MED ORDER — ASPIRIN 81 MG PO TBEC *I*
81.0000 mg | DELAYED_RELEASE_TABLET | Freq: Two times a day (BID) | ORAL | 0 refills | Status: AC
Start: 2019-07-31 — End: 2019-08-29
  Filled 2019-07-31: qty 56, 28d supply, fill #0

## 2019-07-31 MED ORDER — BUPIVACAINE-EPINEPHRINE 0.25 % IJ SOLUTION *WRAPPED*
INTRAMUSCULAR | Status: AC
Start: 2019-07-31 — End: 2019-07-31
  Filled 2019-07-31: qty 50

## 2019-07-31 MED ORDER — CHLORHEXIDINE GLUCONATE 0.12 % MT SOLN *STORES SUPPLIED*
15.0000 mL | Freq: Once | OROMUCOSAL | Status: AC
Start: 2019-07-31 — End: 2019-07-31
  Administered 2019-07-31: 15 mL via OROMUCOSAL

## 2019-07-31 MED ORDER — PROMETHAZINE HCL 25 MG/ML IJ SOLN *I*
INTRAMUSCULAR | Status: DC | PRN
Start: 2019-07-31 — End: 2019-07-31
  Administered 2019-07-31: 6.25 mg via INTRAVENOUS

## 2019-07-31 MED ORDER — BUDESONIDE-FORMOTEROL FUMARATE 160-4.5 MCG/ACT IN AERO *I*
2.0000 | INHALATION_SPRAY | Freq: Two times a day (BID) | RESPIRATORY_TRACT | Status: DC
Start: 2019-07-31 — End: 2019-08-02
  Filled 2019-07-31: qty 6

## 2019-07-31 MED ORDER — SENNOSIDES 8.6 MG PO TABS *I*
2.0000 | ORAL_TABLET | Freq: Every evening | ORAL | 0 refills | Status: DC
Start: 2019-07-31 — End: 2019-08-15
  Filled 2019-07-31: qty 50, 25d supply, fill #0

## 2019-07-31 MED ORDER — DEXTROSE 5 % FLUSH FOR PUMPS *I*
0.0000 mL/h | INTRAVENOUS | Status: DC | PRN
Start: 2019-07-31 — End: 2019-07-31

## 2019-07-31 MED ORDER — ONDANSETRON HCL 2 MG/ML IV SOLN *I*
4.0000 mg | Freq: Four times a day (QID) | INTRAMUSCULAR | Status: DC | PRN
Start: 2019-07-31 — End: 2019-08-02
  Administered 2019-08-01 (×2): 4 mg via INTRAVENOUS
  Filled 2019-07-31 (×2): qty 2

## 2019-07-31 MED ORDER — BUPIVACAINE HCL 0.25 % IJ SOLUTION *WRAPPED*
Status: DC | PRN
Start: 2019-07-31 — End: 2019-07-31
  Administered 2019-07-31: 30 mL via PERINEURAL

## 2019-07-31 MED ORDER — SENNOSIDES 8.6 MG PO TABS *I*
2.0000 | ORAL_TABLET | Freq: Every evening | ORAL | Status: DC
Start: 2019-07-31 — End: 2019-08-02
  Administered 2019-07-31 – 2019-08-01 (×2): 2 via ORAL
  Filled 2019-07-31 (×2): qty 2

## 2019-07-31 MED ORDER — PROPOFOL INFUSION 10 MG/ML *I*
INTRAVENOUS | Status: DC | PRN
Start: 2019-07-31 — End: 2019-07-31
  Administered 2019-07-31: 150 ug/kg/min via INTRAVENOUS
  Administered 2019-07-31: 50 ug/kg/min via INTRAVENOUS
  Administered 2019-07-31: 100 ug/kg/min via INTRAVENOUS

## 2019-07-31 MED ORDER — DEXAMETHASONE SODIUM PHOSPHATE 4 MG/ML INJ SOLN *WRAPPED*
INTRAMUSCULAR | Status: DC | PRN
Start: 2019-07-31 — End: 2019-07-31
  Administered 2019-07-31: 8 mg via INTRAVENOUS

## 2019-07-31 MED ORDER — ONDANSETRON HCL 2 MG/ML IV SOLN *I*
8.0000 mg | Freq: Once | INTRAMUSCULAR | Status: DC | PRN
Start: 2019-07-31 — End: 2019-07-31

## 2019-07-31 MED ORDER — CELECOXIB 200 MG PO CAPS *I*
200.0000 mg | ORAL_CAPSULE | Freq: Two times a day (BID) | ORAL | Status: DC
Start: 2019-08-01 — End: 2019-08-02
  Administered 2019-08-01 – 2019-08-02 (×3): 200 mg via ORAL
  Filled 2019-07-31 (×3): qty 1

## 2019-07-31 MED ORDER — POVIDONE-IODINE 5 % EX SOLN *I*
Freq: Once | CUTANEOUS | Status: AC
Start: 2019-07-31 — End: 2019-07-31

## 2019-07-31 MED ORDER — SODIUM CHLORIDE 0.9 % IV SOLN WRAPPED *I*
100.0000 mL/h | Status: DC
Start: 2019-07-31 — End: 2019-08-02
  Administered 2019-07-31: 125 mL/h
  Administered 2019-07-31: 125 mL/h via INTRAVENOUS
  Administered 2019-08-01 (×2): 100 mL/h via INTRAVENOUS
  Administered 2019-08-02 (×2): 100 mL/h

## 2019-07-31 MED ORDER — PROPOFOL 10 MG/ML IV EMUL (INTERMITTENT DOSING) WRAPPED *I*
INTRAVENOUS | Status: AC
Start: 2019-07-31 — End: 2019-07-31
  Filled 2019-07-31: qty 60

## 2019-07-31 MED ORDER — CAMPHOR-MENTHOL 0.5-0.5 % EX LOTN *I*
TOPICAL_LOTION | CUTANEOUS | Status: DC | PRN
Start: 2019-07-31 — End: 2019-08-02

## 2019-07-31 MED ORDER — CETIRIZINE HCL 10 MG PO TABS *I*
10.0000 mg | ORAL_TABLET | Freq: Every day | ORAL | Status: DC
Start: 2019-08-01 — End: 2019-08-02
  Filled 2019-07-31: qty 1

## 2019-07-31 MED ORDER — HALOPERIDOL LACTATE 5 MG/ML IJ SOLN *I*
0.5000 mg | Freq: Once | INTRAMUSCULAR | Status: DC | PRN
Start: 2019-07-31 — End: 2019-07-31

## 2019-07-31 MED ORDER — TRAZODONE HCL 50 MG PO TABS *I*
50.0000 mg | ORAL_TABLET | Freq: Every evening | ORAL | Status: DC | PRN
Start: 2019-07-31 — End: 2019-08-02
  Filled 2019-07-31: qty 1

## 2019-07-31 MED ORDER — LUBIPROSTONE 24 MCG PO CAPS *I*
24.0000 ug | ORAL_CAPSULE | Freq: Two times a day (BID) | ORAL | Status: DC
Start: 2019-07-31 — End: 2019-08-02
  Administered 2019-07-31 – 2019-08-02 (×4): 24 ug via ORAL
  Filled 2019-07-31 (×6): qty 1

## 2019-07-31 MED ORDER — CEFAZOLIN SODIUM 1000 MG IJ SOLR *I*
INTRAMUSCULAR | Status: DC | PRN
Start: 2019-07-31 — End: 2019-07-31
  Administered 2019-07-31: 2000 mg via INTRAVENOUS

## 2019-07-31 SURGICAL SUPPLY — 46 items
ADHESIVE DERMABOND GLUE HI VISCOSITY (Dressing) ×1
ADHESIVE SKIN CLOSURE 0.7ML DERMABOND ADVANCED (Dressing) ×2 IMPLANT
APPLICATOR CHLORAPREP STER  ORANGE 26ML (Supply) ×6 IMPLANT
APPLICATOR CLEAR CHLORAPREP 26ML (Supply) ×3 IMPLANT
BLADE SAG AGRESSIVE TOOTH (Supply) ×3 IMPLANT
BLADE SAG NARR 12.45 X 1.19 X 82.18MM (Supply) ×3 IMPLANT
BLADE SAG THIN WIDE FLARE (Supply) ×3 IMPLANT
CEMENT REFOBACIN R W GENT 1X40 (Implant) ×6 IMPLANT
DRAPE SHEET 40X70 MED (Drape) ×3 IMPLANT
DRAPE SHEET 70X100 (Drape) ×2
DRAPE STERI TOWEL 1010 NL (Drape) ×3 IMPLANT
DRAPE SUR W70XL100IN STD SMS POLYPR FULL SHT W/O FLD PCH DISP (Drape) ×1 IMPLANT
DRAPE SUR W74XL135IN SMS (Gown) ×1 IMPLANT
DRAPE SURG IOBAN 2 ANTIMIC LG 23 X 17IN (Drape) ×6 IMPLANT
DRESSING AQUACEL AG HYDROFIBER 3.5X10 (Dressing) ×3 IMPLANT
FILTER NEPTUNE 4PORT MANIFOLD (Supply) ×3 IMPLANT
GLOVE BIOGEL PI MICRO IND UNDER SZ 7.0 LF (Glove) ×4 IMPLANT
GLOVE BIOGEL PI ORTHOPRO SZ 8 LF (Glove) ×3 IMPLANT
GLOVE SURG BIOGEL PI ULTRATOUCH SZ 7.0 (Glove) ×12 IMPLANT
GLOVE SURG BIOGEL PI ULTRATOUCH SZ 7.5 (Glove) ×12 IMPLANT
GOWN SIRUS FABRIC REINFORCED SET IN XL (Gown) ×3 IMPLANT
GOWN SIRUS RAGLAN NONREINFORCED XXL (Gown) ×2
HOOD SURG ISOLATION FLYTE SURGICOOL PEEL-AWAY (Supply) ×3 IMPLANT
IMPLANT VANGUARD ANT STABILIZE BRG 10X75 (Implant) ×2 IMPLANT
IMPLANT VANGUARD CR ILOK FEM RT 72.5 (Implant) ×2 IMPLANT
MIXER CEMENT W/FEM NOZZLE (Other) ×3 IMPLANT
PACK CUSTOM TOTAL KNEE (Pack) ×3 IMPLANT
PAD KNEE POSITIONER BOOT STERILE (Supply) ×3 IMPLANT
PEN SURG MARKER REG TIP (Other) ×3 IMPLANT
SLEEVE COMP KNEE MED BLENDED (Supply) ×3 IMPLANT
SOL H2O IRRIG STER 1000ML BTL (Solution) ×1
SOL POVIDONE STERILE 10 PCT 3/4OZ (Solution) ×3 IMPLANT
SOL SOD CHL IRRIG 500ML BTL (Solution) ×3 IMPLANT
SOL SOD CHL IV .9PCT 1000ML BAG (Drug) ×3 IMPLANT
SOL WATER IRRIG STERILE 1000ML BTL (Solution) ×2 IMPLANT
SPIKE MINI DISPENSING PIN (BBRAUN DP1000) (Supply) ×3 IMPLANT
SUCTION YANKAUER HIGH CAP. (Supply) ×3 IMPLANT
SUTR MONOCRYL PLUS 3-0PS2 27IN UNDY (Suture) ×3 IMPLANT
SUTR QUILL MONODERM PREC 2-0 24MM 30X30 (Suture) ×3 IMPLANT
SUTR VICRYL ANTIB 1 CTX 18 POP VIOLET (Suture) ×3 IMPLANT
SUTURE QUILL SZ 2 L2IN ABSRB VLT L48MM 1/2 CIR DA TAPR CUT NDL POLYDIOXANONE MFIL (Suture) ×3 IMPLANT
SYRINGE  LUER LOCK  STERILE  60ML (Other) ×4
SYRINGE 60ML  LUER LOCK  STERILE (Other) ×2 IMPLANT
SYRINGE IRRIG BULB 50CC (Syringe) ×3 IMPLANT
TAPE SURG TRANSPORE 1IN X 10YD LF (Supply) ×3 IMPLANT
TRAY TIB L75MM KNEE COCR FIN MOD INTLOK CEM VANGUARD (Implant) ×2 IMPLANT

## 2019-07-31 NOTE — Plan of Care (Signed)
Patient st cath x1. BP amenable for initiation of Flomax at this time. Continue to monitor PVRs.  Levester Fresh, PA-C

## 2019-07-31 NOTE — Progress Notes (Signed)
Patient interview -  [facility])    Home visit address/ contact phone confirmed: address/phone confirmed    (1) Do you have any of these symptoms of a respiratory infection: fever (>100F), cough, sore throat, or shortness of breath: no    (2) Are you experiencing diarrhea, body aches, or chills: no    (3) Do you have new loss of taste or smell: no    (4) Have you had contact with someone in the last 14 days with a confirmed diagnosis of COVID-19 (positive test result) or who is currently under investigation for COVID-19, or ill with a respiratory illness: no    (5) Has anyone currently in your home traveled outside of [place] in the last 14 days: no    (6) Have you had a test (swab) sent for COVID-19 in the last 14 days: patient tested negative for Covid 19 10/15 prior to surgery at [facility]    -If yes to any of the above, does patient have technology in the home capable of virtual zoom visits (ie: smart phone, ipad, tablet, computer with camera)     Pets/ Smoking: no/no    Home set up/ lives with: Patient is a 68 yo married gentleman who lives in a 2 story home with one step entry.  His bedroom and full bathroom are located on the second floor, however there is a half bathroom on the first floor.  He has a supportive daughter, living with them till November who will assist with care needs following discharge.     ADL needs: independent PTA    Current DME or supplies in home: standard walker, cane, raised toilet seat, shower chair, long handle shoe horn, grab bars    Medication management method: independently takes medications from the bottles    Pneumococcal vaccine given and date.. 11/05/16           Influenza vaccine given and date..07/03/19           Community or [facility] programs active with patient: Patient is new to [place]    Confirmed Attending MD _Dr Andreas Blower to sign __X__all home care orders or____only the following home care orders:    Confirmed Attending  MD office address for home care orders as:_[address], [place]_     MD office Care Manager or clinical contact ... pre approved  Phone #.[facility] KitchenMarland KitchenMar[facility]  Fax #........[place]:  Patient/family informed of home care/ DME copays  _X_not applicable  ___ Yes    Patient has been screened for Medicare as a Secondary Payer (MSP)  ___eligible for [facility]   _X__not eligible    MVA or WC case   __X_No

## 2019-07-31 NOTE — Progress Notes (Signed)
Physical Therapy    Treatment session completed.      Discharge recommendation:  Anticipate return to prior living arrangement, Home PT, Intermittent supervision/assist  Equipment recommendations upon discharge: To be determined  Mobility recommendations for nursing while in hospital: 1A with RW    PT Adult Assessment - 07/31/19 1539        PT Tracking    PT TRACKING  PT Assigned     Type of Session  follow up/treatment     SW Request?  No        Treatment Day    Treatment Day  1        Precautions/Observations    Precautions used  Yes     Weight Bearing Status  Right Lower Extremity - weight bearing as tolerated     LDA Observation  IV lines     Was patient wearing a mask?  Yes     Fall Precautions  General falls precautions;Patient educated to use call light for nursing assist prior to getting up;White board updated with appropriate mobility status        Pain Assessment    *Is the patient currently in pain?  Yes     Pain (Before,During, After) Therapy  During     0-10 Scale  7     Pain Location  Knee     Pain Orientation  Right;Anterior     Pain Descriptors  Sore     Pain Intervention(s)  Cold applied;Repositioned;Refer to nursing for pain management        Cognition    Arousal/Alertness  Appropriate responses to stimuli     Orientation  Alert and oriented x4     Following Commands  Follows all commands and directions without difficulty        Bed Mobility    Bed mobility  Tested     Supine to Sit  Stand by assistance;Side rails up (#);Head of bed elevated        Transfers    Transfers  Tested     Sit to Stand  Contact guard;1 person assist;Verbal cues     Stand to sit  1 person assist;Stand by assistance;Verbal cues     Transfer Assistive Device  rolling walker     Additional comments  cues for safe hand placement. PT Meghan providing stand by assist. Patient passing Egress Test and proceding with gait training        Mobility    Mobility  Tested     Weight Bearing Status  RLE     Weight Bearing Status RLE   Right Lower Extremity - weight bearing as tolerated     Gait Pattern  3 point;2 point;Decreased cadence;Decreased R step length;Decreased R step height;Decreased L step length;Decreased L step height     Ambulation Assist  Contact guard;1 person assist     Ambulation Distance (Feet)  90 ft     Ambulation Assistive Device  rolling walker     Additional comments  Postural cues with gait and sequencing of steps and turns with rolling walker. No buckling or LOB had         Family/Caregiver Training`    Patient/Family/Caregiver training  Yes     Patient training  Role of physical therapy in hospital and plan for evaluation and follow up;Discharge planning;PT plan of care after evaluation;Use of assistive device;Therapeutic exercises, including recommendation of frequency of therapeutic exercises to be completed by patient throughout day;Benefits of oob activity and mobility during hospitalization,  including frequency of ambulation and change of position;Recommendation to increase oob activity and ambulation with nursing while in hospital;Call don't fall, purpose of bed/chair alarm, and recommendation for nursing assistance during all oob mobility        Therapeutic Exercises    Additional comments  reviewed heel slides, ankle pumps, glut sets, quad sets        Functional Outcome Measures    Functional Outcome Measures  Yes        Gait Speed    Gait Speed  Yes     Assistive Device  rolling walker     Distance Walked (meters)  3.05 m     Time it took to walk distance (seconds)  18.42 sec     Gait speed (m/sec)  0.17 m/sec        Assessment    Brief Assessment  Appropriate for skilled therapy     Problem List  Impaired LE ROM;Impaired LE strength;Impaired endurance;Impaired balance;Impaired sensation;Impaired ambulation;Impaired functional status;Impaired mobility     Patient / Family Goal  home        Plan/Recommendation    Treatment Interventions  Restorative PT     PT Frequency  5-7x/wk;BID Visit     Mobility  Recommendations  1A with RW     Referral Recommendations  OT;SW;Home care     Discharge Recommendations  Anticipate return to prior living arrangement;Home PT;Intermittent supervision/assist     PT Discharge Equipment Recommended  To be determined     Assessment/Recommendations Reviewed With:  Nursing;Patient;Family     Next PT Visit  progress per POD#1 protocol        Time Calculation    PT Timed Codes  25     PT Untimed Codes  0     PT Unbilled Time  0     PT Total Treatment  25        Plan and Onset date    Plan of Care Date  07/31/19     Onset Date  07/31/19     Treatment Start Date  07/31/19         Gwyneth Sprout, [place]    [phone]     Gait Speed Score Interpretation:  Leodis Liverpool, Kennyth Arnold PT, PhD; Rexanne Mano PT, PhD. Walking Speed: the Sixth Vital Sign." Journal of Geriatric Physical Therapy 32. 2 (2009) : 2-9.)   0 m/s -0.4 m/s: Household walker; Needs intervention to reduce fall risk; Dependent with ADL's and IADL's   0.4 m/s -0.6 m/s: [employer]; Needs intervention to reduce fall risk   0.6 m/s -1.0 m/s: [employer]; Needs intervention to reduce fall risk   m/s- 1.4 m/s: Normal Walking speed, Cross the [employer], Less likely to have adverse events.

## 2019-07-31 NOTE — Plan of Care (Signed)
Problem: Impaired Bed Mobility  Goal: STG - IMPROVE BED MOBILITY  Note: Patient will perform bed mobility without rails and the head of bed flat with No assist (Independently)     Time frame: 1-3 days     Problem: Impaired Transfers  Goal: STG - IMPROVE TRANSFERS  Note: Patient will complete Sit to stand transfers using least restrictive assistive device with Modified independence     Time frame: 1-3 days     Problem: Impaired Ambulation  Goal: STG - IMPROVE AMBULATION  Note: Patient will ambulate 150-299 feet using least restrictive assistive device with Modified independence    Time frame: 1-3 days       Problem: Impaired Stair Navigation  Goal: STG - IMPROVE STAIR NAVIGATION  Note: Patient will navigate greater than 12 steps with 1  rail(s) and least restrictive assistive device and Modified independence     Time frame: 1-3 days     Leon Montoya PT, DPT   Pager #[phone]

## 2019-07-31 NOTE — Progress Notes (Signed)
[name] was seen and evaluated at bedside in pre-op holding, H&P reviewed, no changes noted.  We reviewed risks related to the procedure including infection requiring further surgery and prolonged morbidity and in extreme cases loss of limb, bleeding, nerve injury resulting in permanent leg weakness/foot drop & gait impairment, blood vessel injury requiring further surgery and in extreme cases loss of limb, iatrogenic femur/tibia fracture, knee stiffness, wound drainage, risk of anesthesia, risk of exacerbation of underlying medical issues resulting in ileus, heart attack, stroke, respiratory failure, kidney failure, blood clots in the legs and or lungs, and even death. The patient understood these risks and elected to proceed. Informed consent was obtained verbally, the surgical site initialed, plan to proceed with right TKA. Verbal consent obtained.     Arley Phenix, MD 07/31/2019 7:21 AM

## 2019-07-31 NOTE — Anesthesia Procedure Notes (Signed)
----------------------------------------------------------------------------------------------------------------------------------------    Adductor Canal Nerve Block      Date of Procedure: 07/31/2019 8:41 AM    Laterality:  Right    Injection Technique: Single-shot    Reason for Block: Post-op pain management and At Surgeon's request        Patient Location: Pre-op  CONSENT AND TIMEOUT     Consent:  Obtained per policy    Timeout Documented by Nursing  METHOD     Patient Position:  Recumbent    Monitoring:  Blood pressure, Continuous pulse oximetry and EKG    Sedation Used:  Yes    Level of Sedation: Minimal              for meds injected see MAR portion of chart    Prep:  Aseptic technique per protocol and Chloraprep    Approach:  In-plane and Lateral and Anterior    Technique: Ultrasound guided       Attempts:  1   NEEDLE     Type:  Short-bevel     Gauge: 22 G     Length: 8 cm  BLOCK EVENTS      No Paresthesia with needle     No Paresthesia with injection     No significant resistance to injection     No significant pain on injection     No blood aspirated     No intravascular injection     Well Tolerated  STAFF     Performed by: Attending Anesthesiologist personally performed    Attending Attestation: I personally performed the procedure     Attending: Donney Dice, MD  ----------------------------------------------------------------------------------------------------------------------------------------

## 2019-07-31 NOTE — Progress Notes (Signed)
Patient medication not available for spacer teach

## 2019-07-31 NOTE — Progress Notes (Addendum)
Physical Therapy    Initial Eval Completed.  Patient is a 68 y.o.male who presents s/p R TKA POD#0.    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      Enlarged prostate with lower urinary tract symptoms (LUTS) 01/26/2017    Environmental allergies 05.25.18    Extrinsic allergic asthma 11/28/2012    Drenda Freeze follows, usually on max therapy in anticipation of bad ragweed season.     HLD (hyperlipidemia)     Hyperlipidemia 05/30/2008    Normal TG, just normal HDL, and LDL as high as 150's at times. No premature CVD in family, mother with CAD late. Started statin in 2014, supplementing with coenzyme Q10 to minimize muscle side effects.     Incomplete Right Bundle Branch Block 05/30/2008          Multiple sclerosis     Osteoarthritis of right knee 02/01/2014    Psoriasis     Thoracic aortic aneurysm (TAA) 02/22/2009    mild- 4.0 cm  by CT scan 5/10 See 6/13 echo [facility] describing no change, plan for 24 month f/u See [id number] [facility] note. See 04/06/16 echo ([facility]) mild.     Tic disorder        Past Surgical History:   Procedure Laterality Date    COLONOSCOPY      HERNIA REPAIR      HX TONSILLECTOMY/ADENOIDECTOMY      INCISIONAL HERNIA REPAIR      KNEE ARTHROSCOPY Right     OTHER SURGICAL HISTORY      MS    VASECTOMY         *Bold Indicates co-morbidities affecting treatment and recovery    Comorbidities affecting treatment/recovery in addition to those listed above:  Total knee replacement    Personal factors affecting  treatment/recovery:   Advanced age   Needs assistance for mobility    Clinical presentation:   stable       Patient complexity:  low level as indicated by above personal factors, environmental factors and comorbidities in addition to their impairments found on physical exam.    PT Adult Assessment - 07/31/19 1335        Prior Living     Prior Living Situation  Reported by patient     Lives With  Spouse     Type of Home  2 Story home     # Steps to Enter Home  1     # Rails to [employer] Home  0     # Of Steps In Home  13     # Rails in Home  1     Location of Bedrooms  2nd floor     Location of Bathrooms  2nd floor;1st floor     Medical Equipment in Home  Cane;Standard [place]        Prior Function Level    Prior Function Level  Reported by patient     Engineer, production  None  Walking  Independent     Walking assistive devices used  None     Stair negotiation  Independent        PT Tracking    PT TRACKING  PT Assigned     Type of Session  evaluation     SW Request?  No        Treatment Day    Treatment Day  1        Precautions/Observations    Precautions used  Yes     Weight Bearing Status  Right Lower Extremity - weight bearing as tolerated     LDA Observation  IV lines     Was patient wearing a mask?  Yes     Fall Precautions  General falls precautions;Patient educated to use call light for nursing assist prior to getting up;White board updated with appropriate mobility status        Pain Assessment    *Is the patient currently in pain?  Yes     Pain (Before,During, After) Therapy  Before;After;During     0-10 Scale  5;6     Pain Location  Knee;Incision     Pain Orientation  Right;Anterior     Pain Descriptors  Aching;Sore     Pain Intervention(s)  Cold applied;Repositioned;Refer to nursing for pain management        Vision     Current Vision  Wears corrective lenses        Cognition    Cognition  Tested     Arousal/Alertness  Appropriate responses to stimuli     Orientation  Alert and  oriented x4     Following Commands  Follows all commands and directions without difficulty        LE Assessment    LE Assessment  Impaired AROM RLE;Full AROM LLE        Sensation    Sensation  No apparent deficit        Bed Mobility    Bed mobility  Tested     Supine to Sit  1 person assist;Stand by assistance;Verbal cues     Sit to Supine  1 person assist;Stand by assistance;Verbal cues     Additional comments  increased time and effort needed due to lack of R LE sensation and pain        Transfers    Transfers  Tested     Sit to Stand  1 person assist;Contact guard;Verbal cues     Stand to sit  1 person assist;Contact guard;Verbal cues     Transfer Assistive Device  rolling walker     Additional comments  verbal cues provided for safe hand placement and body position to increase ease of transfer and to perform safely; no dizziness reported with change in position; patient unable to progress through egress test; therefore, non-powered sit to stand used to move patient from bed to chair in order to change linens due to incontinence        Mobility    Mobility  Not tested (comment)     Additional comments  not appropriate at this time as patient was unable to progress through egress test        Family/Caregiver Training`    Patient/Family/Caregiver training  Yes     Patient training  Role of physical therapy in hospital and plan for evaluation and follow up;Discharge planning;PT plan of care after evaluation;Use of assistive device;Therapeutic exercises, including recommendation of frequency of therapeutic exercises to be completed by patient  throughout day;Benefits of oob activity and mobility during hospitalization, including frequency of ambulation and change of position;Recommendation to increase oob activity and ambulation with nursing while in hospital;Call don't fall, purpose of bed/chair alarm, and recommendation for nursing assistance during all oob mobility        Balance    Balance  Tested     Sitting -  Static  Independent      Standing - Static  Standby assist;Supported     Standing - Dynamic  Supported;Contact guard        Functional Outcome Measures    Functional Outcome Measures  Yes        Gait Speed    Gait Speed  --        Assessment    Brief Assessment  Appropriate for skilled therapy     Problem List  Impaired LE ROM;Impaired LE strength;Impaired endurance;Impaired balance;Impaired transfers;Impaired ambulation;Impaired functional status;Impaired mobility;Impaired bed mobility;Impaired stair navigation;Impaired functional mobility;Pain contributing to impairment     Patient / Family Goal  return home        Plan/Recommendation    Treatment Interventions  Restorative PT     PT Frequency  5-7x/wk;BID Visit     Mobility Recommendations  1A stand aid      Referral Recommendations  OT;SW;Home care     Discharge Recommendations  Anticipate return to prior living arrangement;Home PT;Intermittent supervision/assist     PT Discharge Equipment Recommended  To be determined     Assessment/Recommendations Reviewed With:  Family;Nursing;Patient;Social Worker     Next PT Visit  progress per POD#1 protocol        Time Calculation    PT Untimed Codes  40     PT Unbilled Time  0     PT Total Treatment  40        Plan and Onset date    Plan of Care Date  07/31/19     Onset Date  07/31/19     Treatment Start Date  07/31/19         Arlan Organ PT, DPT   Pager [phone]

## 2019-07-31 NOTE — Progress Notes (Signed)
Home Health Assessment    Completed by: Tahliyah Anagnos, RN     Phone: [phone]    Referred by: social worker, Donna Nasca    Source of Information: E-record    Home Health indicators present: skilled nursing, PT    Barriers to discharge to be addressed: None noted    Plan: [facility] will continue following this patient's hospital course to assist in coordinating a supportive home care plan when he is medically ready for discharge.  Please call with any questions.  Thank you.

## 2019-07-31 NOTE — Progress Notes (Signed)
Pt transferred to E607, Lyndee Leo RN was given verbal report, Sp[ouse Izora Gala updated in PACU

## 2019-07-31 NOTE — Progress Notes (Addendum)
Orthopaedic Surgery Progress Note for 08/01/2019    Patient:[name]  MRN: [id number]  DOA: 07/31/2019    Subjective:  SCx1 O/N and flomax initiated. Able to void spontaneously O/N. Pain controlled. + Eating, drinking, ambulation. Worked with PT. Denies fever/chills, chest pain/SOB, nausea/vomiting, numbness/tingling.        Objective:    Medications    Scheduled:    budesonide-formoterol  2 puff Inhalation BID    gabapentin  300 mg Oral Nightly    cetirizine  10 mg Oral Daily    docusate sodium  200 mg Oral Daily    senna  2 tablet Oral Nightly    polyethylene glycol  17 g Oral Daily    acetaminophen  1,000 mg Oral 3 times per day    morphine  15 mg Oral 2 times per day    celecoxib  200 mg Oral 2 times per day    aspirin EC  81 mg Oral BID    lubiprostone  24 mcg Oral BID WC    dalfampridine  10 mg Oral BID    dimethyl fumarate  240 mg Oral BID    tamsulosin  0.4 mg Oral QPM       Continuous Infusions:    sodium chloride Stopped (07/31/19 1816)       PRN: sodium chloride, dextrose, sodium chloride, dextrose, bisacodyl, traZODone, ondansetron, oxyCODONE **OR** oxyCODONE, calcium carbonate, camphor-menthol    Vital Signs    Current Vitals Vitals Range (24 hours)   BP 100/60 (BP Location: Left arm)    Pulse 54    Temp 36.1 C (97 F) (Temporal)    Resp 16    Ht 1.803 m (5\' 11" )    Wt 73.9 kg (163 lb)    SpO2 97%    BMI 22.73 kg/m  BP: (71-142)/(56-90)   Temp:  [36.1 C (97 F)-36.6 C (97.9 F)]   Temp src: Temporal (10/20 0011)  Heart Rate:  [49-64]   Resp:  [7-22]   SpO2:  [95 %-100 %]   Height:  [180.3 cm (5\' 11" )]   Weight:  [73.9 kg (163 lb)]      Intake/Output:  Date 07/31/19 0700 - 08/01/19 0659 08/01/19 0700 - 08/02/19 0659   Shift 0700-1459 1500-2259 2300-0659 24 Hour Total 0700-1459 1500-2259 2300-0659 24 Hour Total   INTAKE   P.O. 0 560 120 680         P.O. 0 560 120 680       I.V.(mL/kg/hr) 2225(3.8)  850.6 3075.6         Volume (mL) (Lactated Ringers Infusion) 125   125         Volume (mL)  (sodium chloride 0.9 % IV)   850.6 850.6         Volume (mL) (Lactated Ringers Infusion) 2100   2100       Shift Total(mL/kg) 2225(30.1) 560(7.6) 970.6(13.1) 3755.6(50.8)       OUTPUT   Urine(mL/kg/hr) 100(0.2) 800(1.4) 400 1300         Urine 100 737 690 [id number]         Urine Occurrence 2 x   2 x         Pre Void Bladder Scan 274 ml  657 ml 931 ml         Post Void Bladder Scan 999 ml  698 ml 1697 ml       Emesis/NG output             Emesis Occurrence 0  x   0 x       Stool             Stool Occurrence 0 x  0 x 0 x       Blood 100   100         Blood Loss 100   100       Shift Total(mL/kg) 200(2.7) 800(10.8) 400(5.4) 1400(18.9)       NET 2025 -240 570.6 2355.6       Weight (kg) 73.9 73.9 73.9 73.9 73.9 73.9 73.9 73.9       Physical Exam:  General: Alert, no acute distress, sitting in chair.     RLE:   Dressing clean, dry, intact. ACE bandage removed.   Motor intact ankle dorsiflexion/plantarflexion, toe flexion/extension.   Sensation intact to light touch 1st dorsal web space/medial/lateral/plantar/dorsal foot.  Toes warm and well-perfused, < 3 second capillary refill.    Laboratory Studies  Recent Labs   Lab 07/31/19  1639   Hemoglobin 13.0*   Hematocrit 38*     Recent Labs   Lab 07/31/19  1639   Sodium 140   Potassium 4.2   Chloride 103   CO2 25     No components found with this basename: BUN, CREATININE, LABGLOM, GLUCOSE, CALCIUM    No results for input(s): INR, PTT in the last 168 hours.    No components found with this basename: APTT    Micro:  Aerobic Culture   Date Value Ref Range Status   01/26/2017 .  Final       Imaging:  Reviewed     Assessment/Plan: [name] is a 68 y.o. male who is now 1 Day Post-Op s/p R TKA on 07/31/19     1. Pain control: Multimodal  Dietary nutrition supplements adult: HH; Ensure High Protein  Diet regular  2. Post-op Abx  3. Xrays reviewed  4. Weight-bearing status: WBAT RLE, ice/elevate   5. PT/OT & OOB    6. DVT ppx: ASA BID   7. Dispo: Pending PT.      Elinor Parkinson, MD,  PGY-3  Orthopaedic Resident  08/01/2019 at 6:41 AM    Patient seen and evaluated this AM, resident note reviewed, and I agree with the above findings & treatment plan.     Arley Phenix, MD

## 2019-07-31 NOTE — Op Note (Signed)
OPERATIVE NOTE    Date of Surgery:  07/31/2019  Surgeon:  Laurance Flatten, MD  First Assistant:  Theodora Blow, MD PGY III  Second Assistant: A. Callahna, PA    Pre-Op Diagnosis:    Right knee DJD    Deformity:  1. Varus: slight     Anesthesia Type:    Spinal, Regional, General    Post-Op Diagnosis:  Primary:  Same as preoperative     Procedure(s) Performed :  Right TKA (51761)    Soft Tissue Release:  MCL    Intraoperative Findings:  1. End Stage right knee DJD    Implants:  1.  Biomet Vanguard CR femur size 72.5 mm  2.  Unresurfaced  3.  Biomet primary tibial base plate size 75 mm, 40 mm finned stem  4.  Biomet AS polyethylene size 10 mm  5.  Zimmer Refobacin cement      Fixation:  1. Femur: Surface cement  2. Tibia: Full cement    Estimated Blood Loss:  100  Specimens to Pathology:  Yes  Tourniquet Time: NA   Patient Condition:  good    Indications:   [name] is a 68 y.o. 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    Description of Procedure:  [name] was identified in the preoperative holding area and the intended surgical site was marked with the initials "JG". Risks related to this procedure were reviewed with the patient. These risks include but are not limited to infection requiring further surgery and in extreme cases loss of limb, bleeding requiring blood transfusion, nerve injury resulting in foot drop, blood vessel injury, iatrogenic femur/tibia fracture, eventual component failure, post-operative knee stiffness requiring future manipulation, need for further surgery, risks of anesthesia, risk of exacerbation of underlying medical conditions including blood clots in the legs and/or lungs, ileus, heart attack, stroke, respiratory failure, and even death. The patient understood these risks and elected to proceed.  Informed consent was obtained, surgical site marked. The patient was escorted to the operative suite, and was  placed supine on the operating table. Following induction of anesthesia, all bony prominences were well padded. The patient was then prepped and draped in the normal sterile fashion.  Following the standard pre-surgical pause and administration of pre-operative antibiotics, a longitudinal incision slightly medial to the tibial tubracle was made. Dissection was carried sharply down to the level of the quadriceps tendon and patellar retinaculum.  A deep knife was utilized to create a medial parapatellar arthrotomy.  The intraoperative findings listed above were noted. The patella was everted, and the thickness measured with calipers. Synovectomy and lateral facet release completed. Medial exposure was completed, and the anterior horn of the medial and lateral meniscus were ressected along with a portion of the prepatellar fat pad, and ACL. Whiteside's line was marked along with the intramedullary starting position with bovie electrocautery. The femoral canal was perforated with a starting drill, suctioned, and the intramedullary alignment guide inserted through the 5 degree right paddle. The paddle was pinned into position in the appropriate rotation. The distal femoral cutting guide was inserted into the 5 degree right paddle, and pinned. The paddle was then removed along with the intramedullary alignment rod and the distal femoral resection was completed with an oscillating saw blade. The distal femoral resection was measured with caliper to confirm appropriate resection.  The external rotation/sizing guide was then placed referencing the posterior condylar  axis, and the femur was sized and drilled at 3 degrees of external rotation. Rotation was confirmed by referencing anatomic landmarks.  The 72.5 size 4-1 femoral cutting block was impacted into place and secured with drillable pins.  Anterior resection, posterior resection, posterior chamfer, and anterior chamfer cuts were made with an oscillating saw blade and  the cutting block removed. Femoral resections were cleared with hand tools and femoral osteophytes were removed with a rongeur. A PCL retractor was inserted to deliver the tibia anterior. The medial & lateral meniscal remnants were removed, PCL recessed, and the tibia cutting guide positioned referencing 8 mm from the high point of the lateral plateau. Once the tibia guide was correctly oriented in the vertical,coronal, axial, and sagittal planes the cutting block was secured with three drillable pins.  The tibia was resected with an oscillating saw blade.  The appropriately sized tibial base plate was selected, oriented in the correct rotation, and secured with pins x 2.  Propper coronal resection was confirmed with an alignment rod through paddle attached to the trial baseplate. Final preparation was completed by attaching the tibial tower to the baseplate, completing the opening reaming through the tower, followed by impacting the finned punch with mallet. Posterior osteophytes were cleared with hand tools, the flexion gap tensioned  with hand, and assessed for symmetry. The trial femur placed.   The 10 & 12 polyethylene liners were trialed. The soft tissue releases listed above were performed. The final liner listed above was selected which provided the best stability in both the coronal and anterior posterior planes. The patella trial was placed and patella tracking was noted to be appropriate.  With all trial components in place the knee was noted to be stable in flexion and extension. Range of motion was from full extension to greater than 120 degrees of flexion. All components were removed, bony surfaces copiously irrigated and dried with a lap spounge. The final implants listed above were inserted. Extraneous cement was removed. A lateral patella facetectomy was performed with a rongeur. The knee was reassessed for stability, ROM, and patellar tracking all of which were appropriate. The knee was copiously  irrigated, soaked in sterile betadine and closed in layers. The arthrotomy was closed in a semi-flexed position utilizing #2 Quill, #1 Vicryl suture. The knee was injected with 60 cc  .25% marcaine with epinephrine & Toradol. The remaining wound was closed with 2-0 Quill for the subcutaneous tissue, staples, and dermabond for skin. A sterile dressing was placed and drapes removed. The patient was weaned from her sedation and taken to the PACU in stable condition. There were no complications noted.      Signed:  Arley Phenix, MD on 07/31/2019 at 10:06 AM

## 2019-07-31 NOTE — Invasive Procedure Plan of Care (Signed)
[place]  OR SURGICAL PROCEDURE                            Patient Name: [name]  [facility] [address] MR                                                              DOB: May 15, 1951         Please read this form or have someone read it to you.   It's important to understand all parts of this form. If something isn't clear, ask [place] to explain.   When you sign it, that means you understand the form and give [place] permission to do this surgery or procedure.     I agree for Arley Phenix, MD , and Resident Physicians, Physician Assistants, Nurse First Assistants  along with any assistants* they may choose, to treat the following condition(s): Right Knee Arthritis   By doing this surgery or procedure on me: Right Total Knee Replacement    This is also known as: Right Total Knee Arthroplasty (TKA)   Laterality: Right     *if you'd like a list of the assistants, please ask. We can give that to you.    1. The care provider has explained my condition to me. They have told me how the procedure can help me. They have told me about other ways of treating my condition. I understand the care provider cannot guarantee the result of the procedure. If I don't have this procedure, my other choices are: No surgery, conservative care including rest, ice, injections, medication, bracing, cane, walker    2. The care provider has told me the risks (problems that can happen) of the procedure. I understand there may be unwanted results. The risks that are related to this procedure include: Infection requiring further surgery, bleeding, nerve injury causing foot drop, blood vessel injury requiring further surgery for limb salvage, tendon injury, ligament injury, bone fracture, implant failure, implant loosening, knee stiffness, medical complications, blood clots in the legs and/or lungs, need for further surgery, death, and unforseen complications.    3. I understand  that during the procedure, my care provider may find a condition that we didn't know about before the treatment started. Therefore, I agree that my care provider can perform any other treatment which they think is necessary and available.    4. I understand the care provider may remove tissue, body parts, or materials during this procedure. These materials may be used to help with my diagnosis and treatment. They might also be used for teaching purposes or for research studies that I have separately agreed to participate in. Otherwise they will be disposed of as required by law.    5. My care provider might want a representative from a [place] to be there during my procedure. I understand that person works for:  [employer]        The ways they might help my care provider during my procedure include:        Helping the operating room staff prepare the special device my care provider wants to use and Providing information and support  to operating room staff regarding the device    6. Here are my decisions about receiving blood, blood products, or tissues. I understand my decisions cover the time before, during and after my procedure, my treatment, and my time in the hospital. After my procedure, if my condition changes a lot, my care provider will talk with me again about receiving blood or blood products. At that time, my care provider might need me to review and sign another consent form, about getting or refusing blood.    I understand that the blood is from the community blood supply. Volunteers donated the blood, the volunteers were screened for health problems. The blood was examined with very sensitive and accurate tests to look for hepatitis, HIV/AIDS, and other diseases. Before I receive blood, it is tested again to make sure it is the correct type.    My chances of getting a sickness from blood products are small. But no transfusion is 100% safe. I understand that my care provider feels the  good I will receive from the blood is greater than the chances of something going wrong. My care provider has answered my questions about blood products.      My decision  about blood or  blood products   Yes, I agree to receive blood or blood products if my care provider thinks they're needed.        My decision   about tissue  Implants     Yes, I agree to receive tissue implants if my care provider thinks they're needed.          I understand this  form.    My care provider  or his/her  assistants have  explained:   What I am having done and why I need it.  What other choices I can make instead of having this done.  The benefits and possible risks (problems) to me of having this done.  The benefits and possible risks (problems) to me of receiving transplants, blood, or blood products.  There is no guarantee of the results.  The care provider may not stay with me the entire time that I am in the operating or procedure room.  My provider has explained how this may affect my procedure. My provider has answered my  questions about this.         I give my  permission for  this surgery or  procedure.            _______________________________________________                                     My signature  (or parent or other person authorized to sign for you, if you are unable to sign for  yourself or if you are under 80 years old)        ______           Date        _____        Time   Electronic Signatures will display at the bottom of the consent form.    Care provider's statement: I have discussed the planned procedure, including the possibility for transfusion of blood  products or receipt of tissue as necessary; expected benefits; the possible complications and risks; and possible alternatives  and their benefits and risks with the patients or the patient's surrogate. In my opinion, the patient or the patient's  surrogate  understands the proposed procedure, its risks, benefits and alternatives.               Electronically signed by: Laurance Flatten, MD                                                07/31/2019         Date        7:20 AM        Time   Your doctor or someone your doctor has appointed has told you that you may need blood or a blood product transfusion, which has been collected from volunteers, as part of your treatment as a patient.    The reasons you might need blood or blood products include, but are not limited to:    Significant loss of your own blood   Your body may not be getting enough oxygen to its tissues    Treatment of bleeding disorders caused by low platelet counts or platelets that do not work right (platelets are part of a cell that helps to form clots and keeps you from bleeding too much).   You may not have enough of other substances that help your blood clot or stop you from bleeding more  The risks of getting a transfusion of blood or blood products include, but are not limited to:    Damage to the lungs   Difficulty breathing due to fluid in the lungs   The product may contain bacteria or rarely a virus (which includes HIV and Hepatitis)   Blood from the community blood supply has been collected from volunteer donors who have been screened for health risk. The blood has been tested for major blood transmitted disease, but no transfusion is 100% safe. The blood is tested with very sensitive and accurate tests to screen for hepatitis, AIDS, and other disease, which makes the risks very small.   You may have side effects from the transfusion (rash, fever, chills) or an allergic reaction   The transfusion increases your risks of getting infection or cancer coming back   The transfusion can increase the time you have to stay in the hospital   The transfusion can potentially cause death if the wrong blood is given or your body rejects the blood   Before blood is transfused, it is tested again to make sure it is the correct type  There are other options than getting blood or  blood products from other people and they include:    Drugs which can decrease bleeding   Drugs which can cause your body to make more blood (used in elective procedures with advance notice)   Autologous (your own blood) donation (needs pre-arrangement)   No transfusion  If you exercise your right to refuse to be transfused with blood or blood products; these things listed below, among others, could happen to you:   Your body may not get enough oxygen and suffer damage   You may have a higher chance of bleeding   You may limit other options for your condition   You may die from losing too much blood

## 2019-07-31 NOTE — Anesthesia Procedure Notes (Signed)
---------------------------------------------------------------------------------------------------------------------------------------    AIRWAY   GENERAL INFORMATION AND STAFF    Patient location during procedure: OR       Date of Procedure: 07/31/2019 9:11 AM  CONDITION PRIOR TO MANIPULATION     Current Airway/Neck Condition:  Normal        For more airway physical exam details, see Anesthesia PreOp Evaluation  AIRWAY METHOD     Patient Position:  Sniffing    Preoxygenated: no      Induction: Not needed  Mask Difficulty Assessment:  0 - not attempted    Number of Attempts at Approach:  1    Number of Other Approaches Attempted:  0  FINAL AIRWAY DETAILS    Final Airway Type:  Nasal cannula    Head position required to avoid obstruction:  Turned to left    Insertion Site:  Left naris and right naris  ----------------------------------------------------------------------------------------------------------------------------------------

## 2019-07-31 NOTE — Progress Notes (Signed)
07/31/19 1311   [facility] Patient Class Review   Patient Class Review Inpatient

## 2019-07-31 NOTE — Progress Notes (Signed)
Orthopaedic Surgery Progress Note for 07/31/2019    Patient:[name]  MRN: [id number]  DOA: 07/31/2019    Subjective:  Seen at bedside for postop check. Pain controlled. Worked with PT. Denies fever/chills, chest pain/SOB.     Objective:    Medications    Scheduled:    budesonide-formoterol  2 puff Inhalation BID    gabapentin  300 mg Oral Nightly    [START ON 08/01/2019] cetirizine  10 mg Oral Daily    docusate sodium  200 mg Oral Daily    senna  2 tablet Oral Nightly    polyethylene glycol  17 g Oral Daily    acetaminophen  1,000 mg Oral 3 times per day    morphine  15 mg Oral 2 times per day    [START ON 08/01/2019] celecoxib  200 mg Oral 2 times per day    ceFAZolin IV  2,000 mg Intravenous Q8H    aspirin EC  81 mg Oral BID    lubiprostone  24 mcg Oral BID WC    dalfampridine  10 mg Oral BID    dimethyl fumarate  240 mg Oral BID       Continuous Infusions:    sodium chloride 125 mL/hr (07/31/19 1125)       PRN: sodium chloride, dextrose, sodium chloride, dextrose, bisacodyl, traZODone, ondansetron, oxyCODONE **OR** oxyCODONE, calcium carbonate, camphor-menthol    Vital Signs    Current Vitals Vitals Range (24 hours)   BP 116/70    Pulse 55    Temp 36.2 C (97.2 F) (Temporal)    Resp 15    Ht 1.803 m (_0 )    Wt 73.9 kg (163 lb)    SpO2 98%    BMI 22.73 kg/m  BP: (71-142)/(56-90)   Temp:  [36.1 C (97 F)-36.6 C (97.9 F)]   Temp src: Temporal (10/19 1157)  Heart Rate:  [49-64]   Resp:  [7-22]   SpO2:  [95 %-100 %]   Height:  [180.3 cm (_1 )]   Weight:  [73.9 kg (163 lb)]      Intake/Output:  Date 07/30/19 1500 - 07/31/19 0659(Not Admitted) 07/31/19 0700 - 08/01/19 0659   Shift 1500-2259 2300-0659 24 Hour Total 0700-1459 1500-2259 2300-0659 24 Hour Total   INTAKE   P.O.    0   0     P.O.    0   0   I.V.    2225(3.8)   2225     Volume (mL) (Lactated Ringers Infusion)    125   125     Volume (mL) (Lactated Ringers Infusion)    2100   2100   Shift Total(mL/kg)    2225(30.1)   2225(30.1)    OUTPUT   Urine    100(0.2) 800  900     Urine    100 800  900     Urine Occurrence    2 x   2 x     Pre Void Bladder Scan    274 ml   274 ml     Post Void Bladder Scan    999 ml   999 ml   Emesis/NG output            Emesis Occurrence    0 x   0 x   Stool            Stool Occurrence    0 x   0 x   Blood    100  100     Blood Loss    100   100   Shift Total(mL/kg)    200(2.7) 800(10.8)  1000(13.5)   NET    2025 -800  1225   Weight (kg)  73.9 73.9 73.9 73.9 73.9 73.9       Physical Exam:  General: Alert, no acute distress, sitting in chair.     RLE:   Dressing clean, dry, intact.  Motor intact ankle dorsiflexion/plantarflexion, toe flexion/extension.   Sensation intact to light touch 1st dorsal web space/medial/lateral/plantar/dorsal foot.  2+ DP. Toes warm and well-perfused, < 3 second capillary refill.    Laboratory Studies  No results for input(s): WBC, HGB, HCT, PLT, ESR, CRP in the last 168 hours.  No results for input(s): NA, K, CL, CO2 in the last 168 hours.    No components found with this basename: BUN, CREATININE, LABGLOM, GLUCOSE, CALCIUM  No results for input(s): INR, PTT in the last 168 hours.    No components found with this basename: APTT    Micro:  Aerobic Culture   Date Value Ref Range Status   01/26/2017 .  Final       Imaging:  Reviewed     Assessment/Plan: [name] is a 68 y.o. male who is now Day of Surgery s/p R TKA on 07/31/19     1. Pain control: Multimodal  Dietary nutrition supplements adult: HH; Ensure High Protein  Diet regular  2. Post-op Abx  3. Xrays reviewed  4. Weight-bearing status: WBAT RLE, ice/elevate   5. PT/OT & OOB    6. DVT ppx: ASA BID   7. Dispo: Pending PT and pain control.      Elinor Parkinson, MD, PGY-3  Orthopaedic Resident  07/31/2019 at 4:10 PM

## 2019-07-31 NOTE — Anesthesia Procedure Notes (Signed)
---------------------------------------------------------------------------------------------------------------------------------------    NEURAXIAL BLOCK PLACEMENT  Single Shot Spinal    Date of Procedure: 07/31/2019 9:01 AM    Patient Location:  OR    Reason for Block: at surgeon's request and intraoperative anesthetic    CONSENT AND TIMEOUT     Consent:  Obtained per policy    Timeout: patient identified (name/DOB) , nerve block procedure/site/side verified by patient or family, proper patient position verified, operative procedure/site/side verified by patient or family , needed equipment, monitors, medications and access verified as present and functioning , operative procedure/side/site verified against surgical consent, allergies reviewed with patient/record , operative procedure/side/site verified against surgical schedule, all members of the block team participated in the timeout and anticoagulation/antiplatelet status reviewed  METHOD:    Patient Position: sitting    Monitoring: blood pressure and continuous pulse oximetry    Sedation Used: yes            For medications used, please see MAR    Level of Sedation: mild      Prep: aseptic technique per protocol, povidone-iodine and patient draped      Successful Approach: right paramedian    Successful Location: L3-4    Attempts (Skin Punctures):  1     Lot Number: [id number]; Expiration Date: 2020-09-10  SPINAL NEEDLE:      Introducer: 20 G    Type: Pencan    Gauge: 25 G    Length: 3.5 in    CSF Appearance: clear  OBSERVATIONS:    Block Completion:  Successfully completed    Sensory Levels: bilateral  Wet Tap: No      Paresthesia: none  Neuraxial Blood: blood not aspirated      Motor Block: dense    Patient Reaction to Block: tolerated procedure well, pain relief and vitals remained stable  STAFF     Performed by: attending anesthesiologist personally performed    Attending Attestation: I personally performed the procedure     Attending: Donney Dice,  MD  ----------------------------------------------------------------------------------------------------------------------------------------

## 2019-07-31 NOTE — Anesthesia Case Conclusion (Signed)
CASE CONCLUSION  Emergence  Criteria Used for Airway Removal:  Adequate Tv & RR, acceptable O2 saturation and following commands  Assessment:  Routine  Transport  Directly to: PACU  Position:  Supine  Patient Condition on Handoff  Level of Consciousness:  Mildly sedated  Patient Condition:  Stable  Handoff Report to:  RN

## 2019-07-31 NOTE — INTERIM OP NOTE (Signed)
Interim Op Note (Surgical Log ID: [id number])       Date of Surgery: 07/31/2019       Surgeons: Surgeon(s) and Role:     * Ginnetti, Gaynelle Arabian, MD - Primary     * Elinor Parkinson, MD - Resident - Assisting       Pre-op Diagnosis: Pre-Op Diagnosis Codes:     * Osteoarthritis of right knee [M17.11]       Post-op Diagnosis: Post-Op Diagnosis Codes:     * Osteoarthritis of right knee [M17.11]       Procedure(s) Performed: Procedure(s) (LRB):  RIGHT TOTAL KNEE ARTHROPLASTY (Right)       Anesthesia Type: Spinal        Fluid Totals: I/O this shift:  10/19 0700 - 10/19 1459  In: 2100 (28.4 mL/kg) [I.V.:2100]  Out: 100 (1.4 mL/kg) [Blood:100]  Net: 2000  Weight: 73.9 kg        Estimated Blood Loss: 100 mL       Specimens to Pathology:  * No specimens in log *       Temporary Implants:        Packing:                 Patient Condition: good       Findings (Including unexpected complications): Per op note.       Signed:  Elinor Parkinson, MD  on 07/31/2019 at 10:34 AM

## 2019-07-31 NOTE — Progress Notes (Signed)
PRE-OPERATIVE JOINT REPLACEMENT CHECKLIST    SITE: right knee                                     DIAGNOSIS: right knee osteoarthritis    HISTORY:  Description of pain: Dull  Pain Frequency: Continuous  Pain Intensity: 8-10/10  Onset: > 6 months  Duration: > 6 months                  Aggravating factors: Prolonged standing, prolonged ambulation, climbing up and down stairs, getting into and out of a vehicle                            Relieving factors: Rest  Limitation of ADLs: Yes, difficulty with grooming, work, Hydrologist, and leisure activities  Safety Issues (falls): no   Contraindication to non-surgical treatments:no   Failed non-surgical treatments:     Trial of medications (i.e.) NSAIDs: yes   Weight Loss: no    Injections: yes    Physical Therapy:  yes, specific dates not recalled by patient   Activity modification: > 6 months   Assisted ambulatory device: no    Bracing, orthotics: No, patient refused    PHYSICAL EXAM OPERATIVE EXTERMITY:  Deformity:mild varus  Range of Motion: 5 - 135 degrees  Crepitus:Mild  Effusions: trace  Tenderness: Moderate medial femoral condyle, medial joint line  Gait Description: antalgic, heel -toe    INVESTIGATIONS: Radiographs demonstrate: right knee with severe end stage arthritis.    RATIONALE FOR DEVIATING FROM A STEPPED-CARE APPROACH: None    Arley Phenix, MD 07/31/2019 10:06 AM

## 2019-08-01 ENCOUNTER — Inpatient Hospital Stay: Payer: Medicare (Managed Care)

## 2019-08-01 ENCOUNTER — Encounter: Payer: Self-pay | Admitting: Orthopedic Surgery

## 2019-08-01 ENCOUNTER — Other Ambulatory Visit: Payer: Self-pay

## 2019-08-01 DIAGNOSIS — G8918 Other acute postprocedural pain: Secondary | ICD-10-CM

## 2019-08-01 DIAGNOSIS — Z96651 Presence of right artificial knee joint: Secondary | ICD-10-CM

## 2019-08-01 DIAGNOSIS — R112 Nausea with vomiting, unspecified: Secondary | ICD-10-CM

## 2019-08-01 LAB — HCT AND HGB
Hematocrit: 36 % — ABNORMAL LOW (ref 40–51)
Hemoglobin: 11.9 g/dL — ABNORMAL LOW (ref 13.7–17.5)

## 2019-08-01 LAB — BASIC METABOLIC PANEL
Anion Gap: 9 (ref 7–16)
CO2: 30 mmol/L — ABNORMAL HIGH (ref 20–28)
Calcium: 8.8 mg/dL (ref 8.6–10.2)
Chloride: 100 mmol/L (ref 96–108)
Creatinine: 1.04 mg/dL (ref 0.67–1.17)
GFR,Black: 84 *
GFR,Caucasian: 73 *
Glucose: 132 mg/dL — ABNORMAL HIGH (ref 60–99)
Lab: 17 mg/dL (ref 6–20)
Potassium: 4 mmol/L (ref 3.3–5.1)
Sodium: 139 mmol/L (ref 133–145)

## 2019-08-01 LAB — MCHC: MCHC: 34 g/dL (ref 32–37)

## 2019-08-01 MED ORDER — ACETAMINOPHEN 325 MG PO TABS *I*
325.0000 mg | ORAL_TABLET | Freq: Three times a day (TID) | ORAL | 0 refills | Status: DC
Start: 2019-08-01 — End: 2019-11-28
  Filled 2019-08-01: qty 100, 33d supply, fill #0

## 2019-08-01 MED ORDER — CELECOXIB 200 MG PO CAPS *I*
200.0000 mg | ORAL_CAPSULE | Freq: Two times a day (BID) | ORAL | 0 refills | Status: DC
Start: 2019-08-01 — End: 2019-08-14
  Filled 2019-08-01: qty 28, 14d supply, fill #0

## 2019-08-01 MED ORDER — ACETAMINOPHEN 325 MG PO TABS *I*
325.0000 mg | ORAL_TABLET | Freq: Three times a day (TID) | ORAL | Status: DC
Start: 2019-08-01 — End: 2019-08-02
  Administered 2019-08-01 – 2019-08-02 (×2): 325 mg via ORAL
  Filled 2019-08-01 (×2): qty 1

## 2019-08-01 MED ORDER — MORPHINE SULFATE ER 15 MG PO TBCR *I*
15.0000 mg | ORAL_TABLET | Freq: Two times a day (BID) | ORAL | 0 refills | Status: DC
Start: 2019-08-01 — End: 2019-08-01
  Filled 2019-08-01: qty 14, 7d supply, fill #0

## 2019-08-01 MED ORDER — HYDROCODONE-ACETAMINOPHEN 5-325 MG PO TABS *I*
1.0000 | ORAL_TABLET | ORAL | Status: DC | PRN
Start: 2019-08-01 — End: 2019-08-02
  Administered 2019-08-01 (×2): 1 via ORAL
  Filled 2019-08-01 (×2): qty 1

## 2019-08-01 MED ORDER — FAMOTIDINE (PF) 20 MG/2ML IV SOLN *I*
20.0000 mg | Freq: Once | INTRAVENOUS | Status: AC
Start: 2019-08-01 — End: 2019-08-01
  Administered 2019-08-01: 20 mg via INTRAVENOUS
  Filled 2019-08-01: qty 2

## 2019-08-01 MED ORDER — HYDROCODONE-ACETAMINOPHEN 10-325 MG PO TABS *I*
1.0000 | ORAL_TABLET | ORAL | Status: DC | PRN
Start: 2019-08-01 — End: 2019-08-02
  Administered 2019-08-02: 1 via ORAL
  Filled 2019-08-01: qty 1

## 2019-08-01 MED ORDER — ACETAMINOPHEN 500 MG PO TABS *I*
1000.0000 mg | ORAL_TABLET | Freq: Three times a day (TID) | ORAL | 0 refills | Status: DC | PRN
Start: 2019-08-01 — End: 2019-08-01
  Filled 2019-08-01: qty 100, 16d supply, fill #0

## 2019-08-01 MED ORDER — ONDANSETRON HCL 4 MG PO TABS *I*
4.0000 mg | ORAL_TABLET | Freq: Three times a day (TID) | ORAL | 0 refills | Status: DC | PRN
Start: 2019-08-01 — End: 2019-09-12
  Filled 2019-08-01: qty 12, 4d supply, fill #0

## 2019-08-01 MED ORDER — CHLORPROMAZINE HCL 10 MG PO TABS *I*
10.0000 mg | ORAL_TABLET | Freq: Three times a day (TID) | ORAL | Status: AC
Start: 2019-08-01 — End: 2019-08-02
  Administered 2019-08-01 – 2019-08-02 (×2): 10 mg via ORAL
  Filled 2019-08-01 (×5): qty 1

## 2019-08-01 MED ORDER — OXYCODONE HCL 10 MG PO TABS *I*
5.0000 mg | ORAL_TABLET | ORAL | 0 refills | Status: DC | PRN
Start: 2019-08-01 — End: 2019-08-01
  Filled 2019-08-01: qty 60, 10d supply, fill #0

## 2019-08-01 MED ORDER — SODIUM CHLORIDE 0.9 % IV BOLUS *I*
500.0000 mL | Freq: Once | Status: AC
Start: 2019-08-01 — End: 2019-08-01
  Administered 2019-08-01: 500 mL via INTRAVENOUS

## 2019-08-01 MED ORDER — HYDROCODONE-ACETAMINOPHEN 10-325 MG PO TABS *I*
0.5000 | ORAL_TABLET | ORAL | 0 refills | Status: DC | PRN
Start: 2019-08-01 — End: 2019-09-12
  Filled 2019-08-01: qty 42, 7d supply, fill #0

## 2019-08-01 MED ORDER — SODIUM CHLORIDE 0.9 % 100 ML IV SOLN *I*
6.2500 mg | Freq: Four times a day (QID) | INTRAVENOUS | Status: DC | PRN
Start: 2019-08-01 — End: 2019-08-02
  Administered 2019-08-01: 6.25 mg via INTRAVENOUS
  Filled 2019-08-01: qty 1

## 2019-08-01 MED ORDER — FAMOTIDINE 20 MG PO TABS *I*
20.0000 mg | ORAL_TABLET | Freq: Two times a day (BID) | ORAL | 0 refills | Status: DC
Start: 2019-08-01 — End: 2019-09-12
  Filled 2019-08-01: qty 56, 28d supply, fill #0

## 2019-08-01 MED ORDER — TAMSULOSIN HCL 0.4 MG PO CAPS *I*
0.4000 mg | ORAL_CAPSULE | Freq: Every evening | ORAL | 0 refills | Status: DC
Start: 2019-08-01 — End: 2019-08-15
  Filled 2019-08-01: qty 14, 14d supply, fill #0

## 2019-08-01 NOTE — Progress Notes (Addendum)
Patient seen at bedside for nausea this afternoon. Patient resting comfortably in chair in no apparent distress. He is actively belching during conversation. Patient spouse states that he does tend to get nausea with any new medications. Will order for IV pepcid x 1 while in house and discontinue his long acting morphine as it can cause nausea.     Patient will be sent home on Pepcid 20mg  BID to be taken with ASA therapy x 28 days. He will also be sent home with PRN zofran for any further nausea he may have.     BS x4, abdomen soft, non tender, non distended.    Update: Patient vomited ~624mL undigested food after taking oxycodone. Will switch patient to Norco 5/10-325 q4h PRN for breakthrough pain medication. Patient states he feels much better after vomiting but still with hiccups. Patient encouraged to drink water through a paper towel to see if it helps counteract the hiccups. After long discussion with patient and spouse, family feels more comfortable with patient staying overnight to monitor nausea and pain control. Will reassess patient for discharge tomorrow morning.     4034- Patient now with continued hiccups and bowel sounds remain hypoactive. He feels as though the hiccups tend to be a precursor to N/V. Will adjust his diet to clear liquids, continuous fluids 157mL/hr, Thorazine 10mg  ordered q8h x2 for hiccups, and KUB ordered.    Ortho to continue to monitor.    Ruben Im, NP

## 2019-08-01 NOTE — Progress Notes (Signed)
Anesthesiology Nerve Block Progress Note    Pain Consult  LOS: 62952           Currently there is mild pain in the right lower extremity after surgery.    Overnight, the pain has been well controlled.    Patient reports that pain started to come on in the wee hours of the morning overnight.    Significant 24hr Events:  Pruritus: no     Nausea: no     Positional Headache: no  Confusion: no   Sedation: no   Respiratory Depression: no  Shortness of Breath: no       Most Recent Vitals:  Last Filed Vitals    08/01/19 1030   BP: 108/80   Pulse: 96   Resp:    Temp: 36.5 C (97.7 F)   SpO2:      Last Nursing documented pain:  0-10 Scale: 6 (08/01/19 1030 : Rimbert, Annamarie Dawley, RN)     Mental Status: awake, alert and appropriate    Sensory Level:     Right lower extremity has Normal sensation    Covers Pain Area: yes  Motor:    Right lower extremity Grade 5: Normal Strength/5          Scheduled Medications:    budesonide-formoterol  2 puff Inhalation BID    gabapentin  300 mg Oral Nightly    cetirizine  10 mg Oral Daily    docusate sodium  200 mg Oral Daily    senna  2 tablet Oral Nightly    polyethylene glycol  17 g Oral Daily    acetaminophen  1,000 mg Oral 3 times per day    morphine  15 mg Oral 2 times per day    celecoxib  200 mg Oral 2 times per day    aspirin EC  81 mg Oral BID    lubiprostone  24 mcg Oral BID WC    dalfampridine  10 mg Oral BID    dimethyl fumarate  240 mg Oral BID    tamsulosin  0.4 mg Oral QPM       PRN Medications:   sodium chloride  0-500 mL/hr Intravenous PRN    dextrose  0-500 mL/hr Intravenous PRN    sodium chloride  0-500 mL/hr Intravenous PRN    dextrose  0-500 mL/hr Intravenous PRN    bisacodyl  10 mg Rectal Daily PRN    traZODone  50 mg Oral QHS PRN    ondansetron  4 mg Intravenous Q6H PRN    oxyCODONE  5 mg Oral Q4H PRN    Or    oxyCODONE  10 mg Oral Q4H PRN    calcium carbonate  1,000 mg Oral TID PRN    camphor-menthol   Topical PRN       Meds Administered in the  Last 24 Hours:  acetaminophen (TYLENOL) tablet 1,000 mg     Date Action Dose Route User    08/01/2019 0455 Given 1,000 mg Oral Mariam Dollar, RN    07/31/2019 2215 Given 1,000 mg Oral Mariam Dollar, RN    07/31/2019 1407 Given 1,000 mg Oral RimbertAnnamarie Dawley, RN      aspirin EC tablet 81 mg     Date Action Dose Route User    08/01/2019 0924 Given 81 mg Oral Roxana Hires, RN    07/31/2019 2304 Given 81 mg Oral Mariam Dollar, RN    07/31/2019 1232 Given 81 mg Oral Rimbert, Annamarie Dawley, RN  ceFAZolin (ANCEF) syringe 2,000 mg     Date Action Dose Route User    08/01/2019 0050 Given 2,000 mg Intravenous Marianna Fuss, RN    07/31/2019 1822 Given 2,000 mg Intravenous Horris Latino, RN      celecoxib (CeleBREX) capsule 200 mg     Date Action Dose Route User    08/01/2019 [phone] Given 200 mg Oral Rimbert, Velvet Bathe, RN      dalfampridine [facility]) tablet 10 mg (Patient's Own Med)     Date Action Dose Route User    08/01/2019 [phone] Given 10 mg Oral Horris Latino, RN    07/31/2019 1819 Given 10 mg Oral Rimbert, Velvet Bathe, RN      dimethyl fumarate (TECFIDERA) DR capsule 240 mg (Patient's Own Med)     Date Action Dose Route User    08/01/2019 0925 Given 240 mg Oral Horris Latino, RN    07/31/2019 1820 Given 240 mg Oral Rimbert, Velvet Bathe, RN      docusate sodium (COLACE) capsule 200 mg     Date Action Dose Route User    08/01/2019 0923 Given 200 mg Oral Horris Latino, RN    07/31/2019 1231 Given 200 mg Oral Rimbert, Velvet Bathe, RN      gabapentin (NEURONTIN) capsule 300 mg     Date Action Dose Route User    07/31/2019 2215 Given 300 mg Oral Marianna Fuss, RN      lubiprostone (AMITIZA) capsule 24 mcg     Date Action Dose Route User    08/01/2019 0924 Given 24 mcg Oral Horris Latino, RN    07/31/2019 1819 Given 24 mcg Oral Horris Latino, RN      morphine (MS CONTIN) 12 hr tablet 15 mg     Date Action Dose Route User    08/01/2019 0923 Given 15 mg Oral Horris Latino, RN    07/31/2019 2216 Given  15 mg Oral Marianna Fuss, RN    07/31/2019 1408 Given 15 mg Oral Rimbert, Velvet Bathe, RN      ondansetron [facility]) injection 4 mg     Date Action Dose Route User    08/01/2019 1054 Given 4 mg Intravenous Rimbert, Velvet Bathe, RN      oxyCODONE (ROXICODONE) IR tablet 5 mg     Date Action Dose Route User    07/31/2019 1407 Given 5 mg Oral Rimbert, Velvet Bathe, RN      oxyCODONE (ROXICODONE) IR tablet 10 mg     Date Action Dose Route User    08/01/2019 0930 Given 10 mg Oral Horris Latino, RN    08/01/2019 0455 Given 10 mg Oral Marianna Fuss, RN    07/31/2019 2304 Given 10 mg Oral Marianna Fuss, RN    07/31/2019 1819 Given 10 mg Oral Rimbert, Velvet Bathe, RN      polyethylene glycol [facility]) powder 17 g     Date Action Dose Route User    08/01/2019 0924 Given 17 g Oral Horris Latino, RN    07/31/2019 1231 Given 17 g Oral Rimbert, Velvet Bathe, RN      senna [facility]) tablet 2 tablet     Date Action Dose Route User    07/31/2019 2216 Given 2 tablet Oral Marianna Fuss, RN      sodium chloride 0.9 % IV     Date Action Dose Route User    07/31/2019 1128 Rate/Dose Verify 125 mL/hr (none) Marianna Fuss, RN    07/31/2019  1125 New Bag 125 mL/hr Intravenous Elicia Lamp, RN      tamsulosin [facility]) 24 hr capsule 0.4 mg     Date Action Dose Route User    07/31/2019 2029 Given 0.4 mg Oral Marianna Fuss, RN           Lab Results:   Most recent platelet count:       Lab results: 07/10/19  1111   Platelets 183     Most recent GFR:       Lab results: 07/31/19  1639   GFR,Black 93   GFR,Caucasian 80     Most recent creatinine:       Lab results: 07/31/19  1639   Creatinine 0.96     Most recent PT/INR:       Lab results: 07/10/19  1111   Protime 13.2*   INR 1.1         Assessment/Plan:   Patient is postoperative day # 1 after Procedure(s):  RIGHT TOTAL KNEE ARTHROPLASTY , with pain well controlled with a adductor canal nerve block.    Block has resolved.  Pain is well controlled with oral analgesics.    Tylenol     1  gm q 8 hrs  continue    NSAIDS     celecoxib:   consider starting    Opioids    For breakthrough: oxycodone IR q 4 hours PRN   continue        For long-acting (bid dosing):  MS contin  continue      Author: Julian Hy, MD as of 08/01/2019  at 11:15 AM

## 2019-08-01 NOTE — Anesthesia Postprocedure Evaluation (Signed)
Anesthesia Post-Op Note    Patient: [name]    Procedure(s) Performed:  Procedure Summary  Date:  07/31/2019 Anesthesia Start: 07/31/2019  8:47 AM Anesthesia Stop: 07/31/2019 10:33 AM Room / Location:  [id number] / [place] MAIN OR   Procedure(s):  RIGHT TOTAL KNEE ARTHROPLASTY Diagnosis:  Osteoarthritis of right knee [M17.11] Surgeon(s):  Arley Phenix, MD  Elinor Parkinson, MD Responsible Anesthesia Provider:  Donney Dice, MD         Recovery Vitals  BP: 108/80 (08/01/2019 10:30 AM)  Heart Rate: 96 (taken manually ) (08/01/2019 10:30 AM)  Heart Rate (via Pulse Ox): (!) 48 (08/01/2019 10:30 AM)  Resp: 16 (08/01/2019  7:56 AM)  Temp: 36.5 C (97.7 F) (08/01/2019 10:30 AM)  SpO2: 95 % (08/01/2019  7:56 AM)  O2 Flow Rate: 2 L/min (07/31/2019 11:57 AM)   0-10 Scale: 6 (08/01/2019 10:30 AM)    Anesthesia type:  spinal  Complications Noted During Procedure or in PACU:  None   Comment:    Patient Location:  Med Surgical Floor  Level of Consciousness:    Recovered to baseline, alert, oriented and awake  Patient Participation:     Able to participate  Temperature Status:    Normothermic  Oxygen Saturation:    Within patient's normal range  Cardiac Status:   Within patient's normal range and stable  Fluid Status:    Stable and euvolemic  Airway Patency:     Yes  Pulmonary Status:    Baseline and stable  Neuraxial Block Evaluation:    No residual motor or sensory symptoms  Pain Management:    Adequate analgesia and satisfactory to patient  Nausea and Vomiting:  None    Post Op Assessment:    Tolerated procedure well and no evidence of recallAttending Attestation:  All indicated post anesthesia care provided       -

## 2019-08-01 NOTE — Progress Notes (Signed)
Occupational Therapy Evaluation    Initial Eval Completed.  Patient is a 68 y.o.male who presents with s/p R TKA on 07/31/2019.  Patient has cleared OT.     Pt demonstrates safe and adequate independence with ADL's to return home with intermittent supervision/assist from family as needed.      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      Enlarged prostate with lower urinary tract symptoms (LUTS) 01/26/2017    Environmental allergies 05.25.18    Extrinsic allergic asthma 11/28/2012    Drenda Freeze follows, usually on max therapy in anticipation of bad ragweed season.     HLD (hyperlipidemia)     Hyperlipidemia 05/30/2008    Normal TG, just normal HDL, and LDL as high as 150's at times. No premature CVD in family, mother with CAD late. Started statin in 2014, supplementing with coenzyme Q10 to minimize muscle side effects.     Incomplete Right Bundle Branch Block 05/30/2008          Multiple sclerosis     Osteoarthritis of right knee 02/01/2014    Psoriasis     Thoracic aortic aneurysm (TAA) 02/22/2009    mild- 4.0 cm  by CT scan 5/10 See 6/13 echo at [facility] describing no change, plan for 24 month f/u See [id number] [facility] note. See 04/06/16 echo ([facility]) mild.     Tic disorder        Past Surgical History:   Procedure Laterality Date    COLONOSCOPY      HERNIA REPAIR      HX TONSILLECTOMY/ADENOIDECTOMY      INCISIONAL HERNIA REPAIR      KNEE ARTHROSCOPY Right     OTHER SURGICAL HISTORY      MS    VASECTOMY         *Bold Indicates co-morbidities affecting  treatment and recovery    Occupational profile relating to the present problem:   Lives alone   Recent hospital admission    In addition to the PMH and surgical history listed above, the comorbidities affecting treatment/recovery:   Osteoarthritis (OA)   Total knee replacement      Performance deficits that result in activity limitations and/or performance restrictions:    Decreased balance, endurance, strength, impaired ability to participate in ADL's.    Modification of tasks needed or assistance with assessments necessary to enable completion of evaluation component:    MOD I with functional mobility, transfers and UB  ADL's. MIN A with LB dressing tasks.     Patient complexity:  low level as indicated by  personal factors, environmental factors and comorbidities in addition to their impairments found on physical exam.    [facility]) Occupational Therapy Assessment - 08/01/19 1100        OT Tracking    OT Tracking  OT Discontinue     Type of Session  evaluation     SW Request?  No        OT Last Visit    Visit (#) of Five  0  Precautions    Precautions used  Yes        Home Living (Prior to Admission)    Prior Living Situation  Reported by patient     Type of Home  2 Story home     Location of Bedroom  Second floor     Location of Bathroom  Second floor    1/2 on first floor    # Steps to [place]  2     # Of Steps In Home  12     Bathroom Shower/Tub  Walk-in shower     Bathroom Equipment  Grab bars in shower;Raised toilet seat;Grab bars around toilet;Shower Chief Technology Officer in [place];Shower Scientist, research (life sciences) via walker        Prior Function    Prior Function  Reported by patient     Level of Independence  Independent with ADLs;Independent with ADL functional transfers;Independent ambulation;Driving     Lives With  Spouse;Child     Receives Help From  Independent     IADL  Independent        Pain Assessment    *Is  the patient currently in pain?  Yes     Pain Location 1  Knee     Orientation of Pain 1  Right     Pain Scale 1  8     Pain 1  Before;After    With movement    Pain Intervention(s) 1  Cold applied;Refer to nursing for pain management        Vision     Current Vision  Wears corrective lenses        Cognition    Cognition  No deficit noted     Level of Alertness  Appropriate responses to stimuli     Orientation  Alert and oriented x4     Attention   Appears intact     Memory  Appears intact     Following Commands  Follows one step commands 100% of the time        Perception    Perception  No deficit noted        Coordination    Coordination  WFL for ADL completion        Sensation    Sensation  Not tested        UE Assessment    UE Assessment  Full AROM RUE;Full AROM LUE        Bed Mobility    Additional Comments  Patient was greeted in bedside chair.         Functional Transfers    Sit to Stand  Modified Independent     Stand to Sit  Modified Independent     Toilet Transfers  Modified Independent     Additional Comments  Patient uses 2WW and increased time to complete transfers. Patient provided education on hand placement as patient was placing all of weight through walker when standing.         Balance    Sitting - Static  Independent      Sitting - Dynamic  Independent     Standing - Static  Independent;Supported     Standing - Dynamic  Independent;Supported     Standing Tolerance during Functional Task  Fair+    Secondary to pain       ADL Assessment    Eating  Not Tested     Grooming  Modified independent     Where Grooming Assessed  Standing at sink     Assist Needed With:  Increased time to complete     Areas Assessed:  Washing hands     UE Dressing  Independent     LE Dressing  Minimal Assist    Secondary to pain and decreased mobility     Where  LE Dressing Assessed  Edge of bed;Standing     Assist Needed With:  Sequencing;Increased time;Socks    TED stockings    Equipment Used  Walker;Sock aid     Bathing   Not Tested     Toileting  Modified independence     Where Toileting Assessed  Commode chair     Assist needed with  Increased time to complete;Grab bar use     Additional Comments  Patient ambulated to bathroom, demonstrated ability to transfer to commode toilet as this is home set up, but did not void. Patient then sits at EOB to perform dressing tasks. Patient requires MIN A in order to don TED stockings secondary to pain and decreased mobility. Patient educated on use of sock aid to don socks, patient verbalized understanding and returned demonstration to Clinical research associate.          Activity Tolerance    Endurance  Endurance does not limit participation in activity        Additional Comments    Additional Comments  Patient and spouse were educated on TED stocking wear schedule and provided demonstration on donning stockings. Patient and spouse verbalized understanding, spouse states she can provided assistance with stockings post discharge. Patient requested sock aid and reacher, pharmacy called, added to hospital stay bill.     Pt edcated on OT plan of care/OT role, ADL and IADL modifications, use of adaptive equipment, environmental modifications, and level of assist needed at home. Pt verbalized and demonstrated understanding. No further acute OT concerns, cleared from OT.          Plan    OT Frequency  One-time visit     No acute OT needs  Pt demonstrates adequate ADL skills for return to prior living environment;Pt demonstrates adequate cognitive skills for return to prior living environment        Recommendation    OT Discharge Recommendations  Prior Living Environment;Intermittent supervision     OT Discharge Equipment Recommended  Sock aid;Reacher    Pharmacy [facility] Stay Recommendations  Supervision to bathroom using 2WW        Multidisciplinary Communication    Multidisciplinary Communication  RN, Patient, Spouse        Time Calculation    OT Timed Codes  12     OT Untimed Codes  20     OT Unbilled  Time  0     OT Total Treatment  32        Plan and Onset date    Plan of Care Date  08/01/19     Onset Date  07/31/19         Oren Beckmann, OTR/L  Pager: [phone]

## 2019-08-01 NOTE — Plan of Care (Signed)
Patient was pre-assessed by SW and found to have safe home plan.  Writer will follow along with [facility] for any d/c planning needs.  Horatio Pel, LMSW  [phone]  08/01/2019  8:59 AM

## 2019-08-01 NOTE — Progress Notes (Signed)
Physical Therapy    Treatment session completed.      Cleared PT    Discharge recommendation:  Anticipate return to prior living arrangement, Home PT, Intermittent supervision/assist  Equipment recommendations upon discharge: None(patient is getting rolling walker)  Mobility recommendations for nursing while in hospital: 1A with RW    PT Adult Assessment - 08/01/19 0859        PT Tracking    PT TRACKING  PT Assigned     Type of Session  follow up/treatment     SW Request?  No        Treatment Day    Treatment Day  2        Precautions/Observations    Precautions used  Yes     Weight Bearing Status  Right Lower Extremity - weight bearing as tolerated     LDA Observation  None     Was patient wearing a mask?  Yes     Fall Precautions  General falls precautions;Patient educated to use call light for nursing assist prior to getting up;White board updated with appropriate mobility status        Pain Assessment    *Is the patient currently in pain?  Yes     Pain (Before,During, After) Therapy  During;After     0-10 Scale  7;8     Pain Location  Knee     Pain Orientation  Right;Anterior     Pain Descriptors  Sharp     Pain Intervention(s)  Cold applied;Repositioned;Refer to nursing for pain management     Additional comments  Patient due for pain meds at end of session, RN updated        Cognition    Arousal/Alertness  Appropriate responses to stimuli     Orientation  Alert and oriented x4     Following Commands  Follows all commands and directions without difficulty        Bed Mobility    Supine to Sit  Independent     Sit to Supine  Independent        Transfers    Transfers  Tested     Sit to Stand  Modified independent (device)     Stand to sit  Modified independent (device)     Transfer Assistive Device  rolling walker        Mobility    Weight Bearing Status RLE  Right Lower Extremity - weight bearing as tolerated     Gait Pattern  2 point;Decreased cadence;Decreased R step length;Decreased R step height;Decreased L  step length;Decreased L step height     Ambulation Assist  Modified independent (device)     Ambulation Distance (Feet)  180     Ambulation Assistive Device  rolling walker     Stairs Assistance  Modified independent (device)     Stair Management Technique  One rail;Step to pattern;Forwards;With cane     Number of Stairs  12     Platform Step  Modified independent (device)     Additional comments  Emphasis on WBAT status and BUE support on walker when experiencing increased pain. Stairs completed without concern        Family/Caregiver Training`    Patient/Family/Caregiver training  Yes     Patient training  Role of physical therapy in hospital and plan for evaluation and follow up;Discharge planning;PT plan of care after evaluation;Use of assistive device;Therapeutic exercises, including recommendation of frequency of therapeutic exercises to be completed by patient throughout day;Benefits of  oob activity and mobility during hospitalization, including frequency of ambulation and change of position;Recommendation to increase oob activity and ambulation with nursing while in hospital;Call don't fall, purpose of bed/chair alarm, and recommendation for nursing assistance during all oob mobility        Balance    Sitting - Static  Independent      Standing - Static  Independent;Supported     Standing - Dynamic  Independent;Supported        Functional Outcome Measures    Functional Outcome Measures  Yes        Gait Speed    Gait Speed  Yes     Assistive Device  rolling walker     Distance Walked (meters)  3.05 m     Time it took to walk distance (seconds)  9.68 sec     Gait speed (m/sec)  0.32 m/sec        Assessment    Brief Assessment  Patient demonstrates adequate mobility skills to return home     Problem List  Impaired LE ROM;Impaired LE strength;Impaired functional mobility     Patient / Family Goal  home        Plan/Recommendation    Treatment Interventions  Restorative PT     PT Frequency  5-7x/wk     Mobility  Recommendations  1A with RW     Referral Recommendations  OT;SW;Home care     Discharge Recommendations  Anticipate return to prior living arrangement;Home PT;Intermittent supervision/assist     PT Discharge Equipment Recommended  None    patient is getting rolling walker    Assessment/Recommendations Reviewed With:  Nursing;Patient     Next PT Visit  Cleared PT        Time Calculation    PT Timed Codes  27     PT Untimed Codes  0     PT Unbilled Time  0     PT Total Treatment  27        Plan and Onset date    Plan of Care Date  07/31/19     Onset Date  07/31/19     Treatment Start Date  07/31/19         Gwyneth Sprout, [place]    [phone]     Gait Speed Score Interpretation:  Leodis Liverpool, Kennyth Arnold PT, PhD; Rexanne Mano PT, PhD. Walking Speed: the Sixth Vital Sign." Journal of Geriatric Physical Therapy 32. 2 (2009) : 2-9.)   0 m/s -0.4 m/s: Household walker; Needs intervention to reduce fall risk; Dependent with ADL's and IADL's   0.4 m/s -0.6 m/s: [employer]; Needs intervention to reduce fall risk   0.6 m/s -1.0 m/s: [employer]; Needs intervention to reduce fall risk   m/s- 1.4 m/s: Normal Walking speed, Cross the [employer], Less likely to have adverse events.

## 2019-08-01 NOTE — Progress Notes (Signed)
Pt refused spacer teaches and taking MDI.

## 2019-08-02 DIAGNOSIS — M1711 Unilateral primary osteoarthritis, right knee: Secondary | ICD-10-CM

## 2019-08-02 NOTE — Discharge Summary (Signed)
Name: [name] MRN: [id number] DOB: 10-04-1951     Admit Date: 07/31/2019   Date of Discharge: 08/02/2019     Patient was accepted for discharge to   Home or Self Care [1]           Discharge Attending Physician: Arley Phenix, MD      Hospitalization Summary    CONCISE NARRATIVE:   [name] was admitted for elective arthroplasty. Their post operative course was complicated by urinary retention requiring straight catheterization x 1 and initiation of Flomax. Post void residuals were monitored and retention resolved. Patient was started on IV pepcid and given zofran due to nausea/vomiting on POD 1. Long acting morphine was discontinued and Oxycodone was switched to Norco for pain. Due to continued N/V in the evening, his diet was reduced to clear liquids and a KUB was ordered. KUB was unremarkable, and patient was transitioned to a normal diet as symptoms improved. He was given thorazine x 2 for hiccups after conservative measures failed. Patient was discharged to home.            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  There is a mild amount of air and stool projecting over the expected location of the colon and rectum.  There are nonspecific soft tissue calcifications projecting over the abdomen.            SIGNIFICANT MED CHANGES: Yes  Chemical DVT prophylaxis was initiated. Detailed in [facility]. Analgesics tailored to tolerance and adequate pain management.   Flomax 0.4 mg po qhs x 1-2 weeks  Zofran 4mg  PRN for nausea  Pepcid 20mg  BID x 28 days w/ ASA          Signed: Barnett Applebaum, PA  On: 08/02/2019  at: 12:18 PM

## 2019-08-02 NOTE — Discharge Instructions (Signed)
Surgical date: 07/31/2019 PR TOTAL KNEE ARTHROPLASTY [[id number]] (ARTHROPLASTY, KNEE, TOTAL)     Thank you for choosing [facility] for your joint replacement.  Please adhere to the following instructions in order to maximize your recovery.    Please follow the instructions next to any check box that has been checked i.e. [x]     Activity:   You should be weight bearing as tolerated  on your operative extremity. Walk and stair-climb as tolerated.    Use assistive devices (walker, crutches, cane) as instructed by your Physical Therapist.    Do not lift, carry, push or pull objects greater than 10 pounds until cleared by your surgeon.    Driving is permitted once you are functionally cleared to do so by your surgeon AND you are no longer taking narcotic pain medications.     Diet   You may resume your previous diet, as tolerated.    Please aim for protein intake of approximately 100 grams per day to promote healing. (Meats, fish, peanut butter, and ensure/boost shakes are all good sources of protein.)   If you feel nauseous or develop vomiting, go back to just clear liquids, increasing to a soft diet and then regular solid foods as tolerated.   Smoking and/or nicotine use are associated with poor wound healing and increased risk of infection, therefore, it is recommended you not smoke or use nicotine for as long as possible following-surgery.   If you are diabetic, it is important to keep your blood sugars well-controlled, poorly controlled blood sugars are associated with poor wound healing and increased risk of infection.     Labs  [x]    None required.    Preventing Blood Clots (Deep Vein Thrombosis/Pulmonary Embolism or DVT/PE):   Please wear your anti-embolism stockings as directed by your surgeon.     You will be prescribed a blood thinner to help minimize the increased risk of blood clots following surgery. Please refer to your MEDICATION LIST for details. While taking blood  thinners, you may find you bruise or develop nose bleeds more easily. You should watch for signs of gastrointestinal bleeding, which may include dark, tar-like stools or vomit with the appearance of coffee-grounds. You should call your surgeon if you notice these signs.    You have been instructed to take Enteric coated Aspirin 81mg  twice daily for 4 weeks for deep vein thrombosis prophylaxis (blood clot prevention).     We recommend taking Asprin with some food in your stomach, and if appropriate, considering medical history and prior medications, taking famotidine(pepcid) 20 mg twice daily to aid in gastric protection.      Managing your Pain:  You are being discharged on a combination narcotic medication called Norco (hydrocodone/acetaminophen).   While using this product you may use additional acetaminophen (tylenol), but with caution to not exceed more then 3000mg  total daily between the two agents. Please monitor your daily intake between these two medications.    Please refer to your MEDICATION LIST for detailed instructions on your personalized pain protocol.   You should apply ice for 30 minutes every 1-2 hours and elevate your extremity above the level of your heart 3 x day and during rest to help reduce swelling and pain.   Narcotic medications are often necessary for a short time after total joint surgery. While you are taking narcotics:  o Do NOT drive or operate heavy machinery.  o Do NOT drink alcohol.  o Do NOT make any important  personal, business, or economic decisions or sign legal documents.  o Narcotics can cause side effects such as sedation, lethargy, body wide itching, and constipation.  o To prevent/alleviate constipation, you should walk as much as you can tolerate and drink plenty of fluids. You should also use a stool softener such as Colace (docusate sodium) and a laxative such as Senokot (senna) unless you develop diarrhea or loose stool.  These two medications have been prescribed  to you.  See your MEDICATION LIST for details.    - If you have not had a bowel movement by your 3rd day after surgery please consider adding miralax, or taking magnesium citrate to help facilitate a bowel movement. Utilize your pharmacist, or please call your PCP/surgeon's office, if you have any concerns about the addition of these medications to your current home regime   You should work to gradually wean off of narcotic pain medications as your pain improves, first by decreasing the dose, and then by increasing the amount of time in between doses.     It is important to dispose of all narcotics when you are certain you no longer need them. The first choice for all drug disposal is an authorized medication disposal bin which are located at the [facility] pharmacy, most retail pharmacies, and law enforcement stations. If you are unable to access one of these bins, you should flush un-used narcotics down the toilet.    Caring for your incision:   Your incision is covered with a surgical dressing.  Leave this dressing in place as long as it remains adhered. Likely, it will remain adhered until the time of your follow up appointment. After two weeks your dressing may be removed. After two weeks if your incision is dry it may be left open to air and you may shower   If you have drainage from your incision: You may not shower. Call your surgeons office.   If there is no drainage you may shower with intact dressing in place. If theres any sign that the dressing is peeling at any corner, or wrinkled, cover with plastic wrap before showering. Do not soak your wound in a bath, whirlpool, or in any kind of standing water.    Underneath the dressing you have surgical staples or sutures that may be removed by the visiting nurse as part of your home care. They should not be removed before 14 days after surgery.   If your bandage has standing fluid beneath it, is >80% soiled, or has become mostly unattached from  the skin, remove and cover with a dry dressing and tape until follow-up. Do not shower and do not allow incision to get wet.      Follow-up:   Call your surgeons office at [phone] to make a follow-up appointment.     You or your caregiver should call the surgeons office at [phone] (or [phone] after business hours) for:   Redness around incisions   Continuous drainage or bleeding from incisions (a small amount of drainage is expected initially)   Temperature above 101 F   Increasing swelling, numbness, or pain not controlled by your pain medications   Any other worrisome condition  There is always someone on call, 24/7 to address your concerns.  Please dont hesitate to reach out with your questions. In the event that you are unable to contact the office or the answering service, please go to [facility] Emergency Department for evaluation.    Your surgeon requests that  you do not receive the influenza vaccine until 2 weeks have passed since your surgical date    Urinary retention- You were found to have urinary retention while you were in the hospital (inability to fully empty your bladder). This has improved but requires ongoing mindfulness.   -Spread out through the day the amount of fluid you drink. Do not drink a lot at bedtime.  -Urinate as soon as you feel the need or at least every 3 hours while you are awake.   -Urinate right before you go to bed.  -Avoid alcohol and caffeine.    Seek prompt attention/medical advice if:  -You cannot urinate at all, or it is getting harder to urinate.  -You have symptoms of a urinary tract infection. These may include:  Pain or burning when you urinate, pain in the flank (which is just below the rib cage and above the waist on either side of the back), blood in your urine, or a fever.    It is recommended that you take flomax for 1-2 weeks after your discharge from the hospital. This medication can cause your blood pressure to lower, which can be aggravated  by change in position. We suggest that you pause between transitioning from laying to sitting to standing, to give your body time to regulate. If you have dizziness, or feel as if you are going to pass out, call/seek medical attention.

## 2019-08-02 NOTE — Progress Notes (Signed)
HOME CARE DISCHARGE PLAN    The following services have been arranged with [facility] [phone]/ [phone]:  __X____Nursing   __X____Physical Therapy      The agency will call to schedule first home visit date for skilled nursing and PT services requested within 24-48 hours of facility discharge, and pt/family are in agreement.    Special instructions for scheduling first visit - please call patient [phone] to schedule visits     Blood draw is not applicable __X___    Pharmacy is: [facility] outpatient pharmacy    The patients identified caregiver is: self/spouse    Focus of care and identified patient/caregiver teaching needs at home: Right TKA    Follow-up MD appts scheduled: Dr Marciano Sequin, November 3, 8:40am      The above arrangements are based on an in-hospital evaluation. It is short-term and will be re-evaluated by the [facility] Nurse in the home on a regular basis, as per physicians order.

## 2019-08-02 NOTE — Progress Notes (Addendum)
Orthopaedic Surgery Progress Note for 08/02/2019    Patient:[name]  MRN: [id number]  DOA: 07/31/2019    Subjective:  KUB w/o significant findings. Emesis overnight, +flatus. States that he feels much better. Pain controlled. + Eating, drinking, ambulation. Cleared PT. Denies fever/chills, chest pain/SOB, numbness/tingling.        Objective:    Medications    Scheduled:    acetaminophen  325 mg Oral 3 times per day    budesonide-formoterol  2 puff Inhalation BID    gabapentin  300 mg Oral Nightly    cetirizine  10 mg Oral Daily    docusate sodium  200 mg Oral Daily    senna  2 tablet Oral Nightly    polyethylene glycol  17 g Oral Daily    celecoxib  200 mg Oral 2 times per day    aspirin EC  81 mg Oral BID    lubiprostone  24 mcg Oral BID WC    dalfampridine  10 mg Oral BID    dimethyl fumarate  240 mg Oral BID    tamsulosin  0.4 mg Oral QPM       Continuous Infusions:    sodium chloride 100 mL/hr (08/02/19 0455)       PRN: HYDROcodone-acetaminophen **OR** HYDROcodone-acetaminophen, promethazine, sodium chloride, dextrose, sodium chloride, dextrose, bisacodyl, traZODone, ondansetron, calcium carbonate, camphor-menthol    Vital Signs    Current Vitals Vitals Range (24 hours)   BP 93/53 (BP Location: Left arm)    Pulse 53    Temp 36.3 C (97.3 F) (Temporal)    Resp 15    Ht 1.803 m (5\' 11" )    Wt 73.9 kg (163 lb)    SpO2 96%    BMI 22.73 kg/m  BP: (93-109)/(53-80)   Temp:  [36.3 C (97.3 F)-37.3 C (99.1 F)]   Temp src: Temporal (10/20 2357)  Heart Rate:  [49-96]   Resp:  [15-16]   SpO2:  [95 %-98 %]      Intake/Output:  Date 08/01/19 0700 - 08/02/19 0659 08/02/19 0700 - 08/03/19 0659   Shift 0700-1459 1500-2259 2300-0659 24 Hour Total 0700-1459 1500-2259 2300-0659 24 Hour Total   INTAKE   P.O. 800 770 430 5282         P.O. 800 770 430 5282       I.V.(mL/kg/hr)   993.8 993.8         Volume (mL) (sodium chloride 0.9 % IV)   993.8 993.8       IV Piggyback   25 25         Volume (mL) (promethazine  (PHENERGAN) 6.25 mg in sodium chloride 0.9% 25 mL IVPB)   25 25       Shift Total(mL/kg) 800(10.8) 800(10.8) 1218.8(16.5) 2818.8(38.1)       OUTPUT   Urine(mL/kg/hr) 226(0.4) 475(0.8) 550 1251         Urine 226 [phone]         Urine Occurrence  2 x 2 x 4 x         Pre Void Bladder Scan 385 ml   385 ml         Post Void Bladder Scan 156 ml 103 ml 146 ml 405 ml       Emesis/NG output 600   600         Emesis 600   600         Emesis Occurrence  1 x  1 x  Stool 0   0         Stool 0   0       Shift Total(mL/kg) 826(11.2) 475(6.4) 550(7.4) 1851(25)       NET -26 325 668.8 967.8       Weight (kg) 73.9 73.9 73.9 73.9 73.9 73.9 73.9 73.9       Physical Exam:  General: Alert, no acute distress, sitting in chair.     RLE:   Dressing clean, dry, intact  Motor intact ankle dorsiflexion/plantarflexion, toe flexion/extension.   Sensation intact to light touch 1st dorsal web space/medial/lateral/plantar/dorsal foot.  Toes warm and well-perfused, < 3 second capillary refill.    Laboratory Studies  Recent Labs   Lab 08/01/19  1657 07/31/19  1639   Hemoglobin 11.9* 13.0*   Hematocrit 36* 38*     Recent Labs   Lab 08/01/19  1657 07/31/19  1639   Sodium 139 140   Potassium 4.0 4.2   Chloride 100 103   CO2 30* 25     No components found with this basename: BUN, CREATININE, LABGLOM, GLUCOSE, CALCIUM    No results for input(s): INR, PTT in the last 168 hours.    No components found with this basename: APTT    Micro:  Aerobic Culture   Date Value Ref Range Status   01/26/2017 .  Final       Imaging:  Reviewed     Assessment/Plan: [name] is a 68 y.o. male who is now 2 Days Post-Op s/p R TKA on 07/31/19     1. hct 36  2. Pain control: Multimodal  Dietary nutrition supplements adult: HH; Ensure High Protein  Diet clear liquid  3. Post-op Abx  4. Xrays reviewed  5. Weight-bearing status: WBAT RLE, ice/elevate   6. PT/OT & OOB    7. DVT ppx: ASA BID   8. Dispo: hopeful for discharge today    Rush Farmer. Bridgett Larsson, MD  Orthopaedic  Surgery Resident  08/02/2019  6:54 AM     Patient seen and evaluated this AM, resident note reviewed, and I agree with the above findings & treatment plan.     Arley Phenix, MD

## 2019-08-03 ENCOUNTER — Telehealth: Payer: Self-pay

## 2019-08-03 ENCOUNTER — Telehealth: Payer: Self-pay | Admitting: Orthopedic Surgery

## 2019-08-03 NOTE — Telephone Encounter (Signed)
Nurse Navigator initial discharge follow up phone call : R T K A  How are you doing?   Fairly well    Eating/drinking   Yes         Nausea mild but much less.   Hiccups and  Thorazine x 2 inpt   Resolved    ?drinking additional 20-30 gms of protein Yes  Any prior to OR medication changes post op ?  Ie cardiac meds  Yes zofran prn  flomax for urine retention.  Voiding well.   Bowel habits      Passing gasYes   BM?  Yes smalll soft.   Bowel regime   Yes  Pain    In last 24 hours  Level range LEAST  2  lying still Worst  8  location  R leg and knee               Type pressure and sharp pain  Pain Meds and frequency Tylenol 325 mg TID  Yes  MSCONTIN BID N/A Gabapentin at hs Yes CELEBREX/ MOBICYes  Norco q 4 hours prn Yes (#   4  tabs in last 24 hours taken)  Swelling  Using Ice Yes   Three times per day, legs above heart for 20 minutes elevating Yes  Wound  Incision covered with dressing Yes   drainage ;No     bruising No   Anticoagulant ASA 81 mg BID X 28 daysYes         Labs being drawn N/A  Wearing TEDS Yes     x 28 days        ACE Yes  Prn to R leg to decrease swelling.    Home RN/PT/OT visiting Yes   CHN may remove drsg and staples at 14 days  Post op  Doing excercises Yes     Walking Yes every hour when awake.   Pain Management  Bowel Management  Nausea control with zofran  Dressing Management Yes    .  Date of Next Appt:  08/15/2019  ZOOM /telehealth Yes                         If any acute issues arise please contact your surgeons office @ [phone].     After hours [place]

## 2019-08-03 NOTE — Telephone Encounter (Signed)
Telephone call to the patient, advised that stockings are to remain in place 28 days postop.  Dressing may be removed at 2 weeks postop, staples to removed by visiting nurse at that time.  Additional questions invited and answered.

## 2019-08-03 NOTE — Telephone Encounter (Signed)
Please call thi spt about some questions he has about his meds and stockings please [phone]

## 2019-08-11 ENCOUNTER — Telehealth: Payer: Self-pay | Admitting: Orthopedic Surgery

## 2019-08-11 NOTE — Telephone Encounter (Signed)
-  Telephone call to the patient, patient states only the corners are peeling back but the dressing remains completely intact otherwise.  Patient devised to leave the dressing in place until removal for staples home nurse appointment.  Patient was agreeable to this plan.  He will keep [place] appraised of any other concerns.  He does have an upcoming two-week postop video visit next week.

## 2019-08-11 NOTE — Telephone Encounter (Signed)
Pt would like a call about what he should do about his dressing that is peeling off three different corners?   Amy the [facility] nurse called and asked if you could call the pt back and advise him please. Pt's phone is:  [phone]

## 2019-08-14 ENCOUNTER — Telehealth: Payer: Self-pay | Admitting: Orthopedic Surgery

## 2019-08-14 MED ORDER — CELECOXIB 200 MG PO CAPS *I*
200.0000 mg | ORAL_CAPSULE | Freq: Two times a day (BID) | ORAL | 0 refills | Status: DC
Start: 2019-08-14 — End: 2019-08-28

## 2019-08-14 NOTE — Telephone Encounter (Signed)
Rise Paganini has questions about pt's current meds. He is in a lot of pain but stopped taking Oxy because of upset stomach. Please call her at [phone]

## 2019-08-14 NOTE — Telephone Encounter (Signed)
Phone call to the patient's visiting nurse, patient has stopped oxycodone because of undesirable side effects.  He remains compliant with Celebrex 200 mg twice a day, a refill for this was requested and provided.  He is only using Tylenol 325 mg infrequently for pain management.  Advised he may increase Tylenol to 1000 mg 3 times a day to see if this helps.  Continue with frequent usage of circumferential ice.  He has a telemedicine postop visit tomorrow, we will discuss additional pain management as needed.  Additional questions were invited and answered as well.

## 2019-08-15 ENCOUNTER — Ambulatory Visit: Payer: Medicare (Managed Care) | Admitting: Orthopedic Surgery

## 2019-08-15 DIAGNOSIS — Z96651 Presence of right artificial knee joint: Secondary | ICD-10-CM

## 2019-08-15 NOTE — Progress Notes (Signed)
Video Visit     Location of Patient: home    Location of Telemedicine Provider: hospital / clinical location    Other participants in telemedicine encounter and roles:  none    This is an established patient visit.    Reason for visit: Follow-up right TKR    Patient's problem list, allergies, and medications were reviewed and updated as appropriate.  Please see the EHR for full details.    Consent was obtained from the patient to complete this video visit; including the potential for financial liability.      Kaelee Pfeffer, PA    ADULT POST OP TKR    [name] presents today for a post-op visit following aright total knee arthroplasty performed 07/31/19. Currently patient is doing rehab athome.  Patient states pain is 7 out of 10.  The patient is ambulating with a walker.  The surgical wound has been dry. Range of motion has been progressing well. Patient states his flexion has been 100 with physical therapy.  Has discontinued narcotics at this time, uses Tylenol 3 times a day and gabapentin.       Exam: Swelling ismild.  The surgical wound is healing well. There is no redness. There is no drainage. There is no induration.       Radiographs were obtained as operatively in the hospital and personally reviewed. These show a total knee arthroplasty with normal alignment.    Impression: Right TKR-07/31/19.     Plan:  The patient's postoperative hospital x-ray findings and underlying diagnosis reviewed.  Patient has been making gradual improvement over time.  Incisional staples may be removed at this time.  Continue with supervised physical therapy as per TKR protocol.  Importance of following dental antibiotic prophylaxis precautions reviewed.  Complete aspirin twice a day 28 days for DVT prophylaxis. Acceptable low-impact exercise activities were once again reviewed.  All ambulatory activities to be performed within the confines of comfort.  Additional questions invited and answered.     Follow up in 4  weeks.    Orders Next Visit: 3 views right knee x-ray.

## 2019-08-16 ENCOUNTER — Telehealth: Payer: Self-pay | Admitting: Orthopedic Surgery

## 2019-08-16 NOTE — Telephone Encounter (Signed)
[facility] Nurse, called wanting a call back to discuss the pt's meds. Patient states he does not want to take some of them anymore   [facility]'s number is [phone]  Thank you

## 2019-08-16 NOTE — Telephone Encounter (Signed)
Telephone call to the patient's visiting nurse, clarified postop medications.

## 2019-08-21 ENCOUNTER — Emergency Department: Payer: Medicare (Managed Care)

## 2019-08-21 ENCOUNTER — Encounter: Payer: Self-pay | Admitting: Geriatric Medicine

## 2019-08-21 ENCOUNTER — Emergency Department
Admission: EM | Admit: 2019-08-21 | Discharge: 2019-08-21 | Disposition: A | Discharge status: Home / Self Care | Payer: Medicare (Managed Care) | Source: Ambulatory Visit | Attending: Emergency Medicine | Admitting: Emergency Medicine

## 2019-08-21 DIAGNOSIS — M7121 Synovial cyst of popliteal space [Baker], right knee: Secondary | ICD-10-CM

## 2019-08-21 DIAGNOSIS — M79661 Pain in right lower leg: Secondary | ICD-10-CM

## 2019-08-21 DIAGNOSIS — M7989 Other specified soft tissue disorders: Secondary | ICD-10-CM

## 2019-08-21 DIAGNOSIS — R2241 Localized swelling, mass and lump, right lower limb: Secondary | ICD-10-CM

## 2019-08-21 NOTE — ED Provider Notes (Signed)
History     Chief Complaint   Patient presents with    Calf Pain     right      68 year old male with past medical history of asthma, multiple sclerosis, hyperlipidemia who presents to the emergency department with right calf swelling.  Patient had surgery on 10/19 with Dr. Noni Saupe for total knee replacement.  He noticed yesterday that he had increased swelling and pain in his calf especially with movement.  So the pain is currently rated 4 out of 10 but is worse with movement.  He took Tylenol and Celebrex at home which manages the pain.  He denies any personal history of blood clots.  He has been taking aspirin 81 mg a day to prevent blood clot formation.      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     HLD (hyperlipidemia)     Hyperlipidemia 05/30/2008    Normal TG, just normal HDL, and LDL as high as 150's at times. No premature CVD in family, mother with CAD late. Started statin in 2014, supplementing with coenzyme Q10 to minimize muscle side effects.     Incomplete Right Bundle Branch Block 05/30/2008          Multiple sclerosis     Osteoarthritis of right knee 02/01/2014    Psoriasis     Thoracic aortic aneurysm (TAA) 02/22/2009    mild- 4.0 cm  by CT scan 5/10 See 6/13 echo at [facility] describing no change, plan for 24 month f/u See [id number] [facility] note. See 04/06/16 echo ([facility]) mild.     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 Z96.651            Past Surgical History:   Procedure Laterality Date    COLONOSCOPY      HERNIA REPAIR      HX TONSILLECTOMY/ADENOIDECTOMY      INCISIONAL HERNIA REPAIR      KNEE ARTHROSCOPY Right     OTHER SURGICAL HISTORY      MS    PR TOTAL KNEE ARTHROPLASTY Right 07/31/2019    Procedure: RIGHT TOTAL KNEE ARTHROPLASTY;  Surgeon: Arley Phenix, MD;  Location: [facility] MAIN OR;  Service: Orthopedics    VASECTOMY       Family History   Problem Relation Age of Onset    Heart Disease Mother         coronary art  disease, not premature    Cancer Father         multiple myeloma    Cancer Maternal Grandmother         unknown    Heart Disease Brother         aneurism-07-09-18    Multiple Sclerosis Neg Hx     Lupus Neg Hx     Rheum arthritis Neg Hx     Thyroid disease Neg Hx     Diabetes Neg Hx     Colon cancer Neg Hx     Colon polyps Neg Hx     High Blood Pressure Neg Hx           Social History     Tobacco Use    Smoking status: Never Smoker    Smokeless tobacco: Never Used   Substance Use Topics    Alcohol use: Never     Comment: Very occasionaly    Drug use: Yes     Frequency: 2.0 times per week     Types: Marijuana     Living Situation     Questions Responses    Patient lives with Spouse    Homeless No    Caregiver for other family member No    External Services None    Employment Retired    Curator Violence Risk No                Review of Systems   Review of Systems   Constitutional: Negative.  Negative for activity change, appetite change, chills and fever.   HENT: Negative.  Negative for congestion, sore throat and voice change.    Eyes:  Negative.    Respiratory: Negative.    Cardiovascular: Positive for leg swelling. Negative for chest pain and palpitations.   Gastrointestinal: Negative.  Negative for abdominal distention, abdominal pain, constipation, diarrhea, nausea and vomiting.   Genitourinary: Negative.  Negative for decreased urine volume, difficulty urinating, dysuria, flank pain, frequency and urgency.   Musculoskeletal: Positive for arthralgias. Negative for back pain, gait problem, joint swelling and myalgias.   Skin: Negative.  Negative for color change, pallor, rash and wound.   Neurological: Negative.  Negative for dizziness, tremors, syncope, weakness and headaches.   Hematological: Negative.    Psychiatric/Behavioral: Negative.  Negative for confusion. The patient is not nervous/anxious.        Physical Exam     Triage Vitals  Triage Start: Start, (08/21/19 1036)   First Recorded BP: 125/80, Resp: 16, Temp: 35.9 C (96.6 F) Oxygen Therapy SpO2: 98 %, O2 Device: None (Room air), Heart Rate: 72, (08/21/19 1039)  .  First Pain Reported  0-10 Scale: 6, Pain Location/Orientation: Leg Right, (08/21/19 1039)       Physical Exam  Vitals signs reviewed.   Constitutional:       General: He is not in acute distress.     Appearance: Normal appearance. He is not ill-appearing.   HENT:      Head: Normocephalic and atraumatic.      Nose: Nose normal.   Cardiovascular:      Rate and Rhythm: Normal rate.      Pulses: Normal pulses.   Pulmonary:      Effort: Pulmonary effort is normal.   Musculoskeletal: Normal range of motion.      Right ankle: He exhibits swelling. He exhibits normal range of motion, no ecchymosis, no deformity, no laceration and normal pulse.        Legs:  Skin:     General: Skin is warm and dry.   Neurological:      General: No focal deficit present.      Mental Status: He is alert and oriented to person, place, and time.      Cranial Nerves: No cranial nerve deficit.      Sensory: No sensory deficit.   Psychiatric:          Mood and Affect: Mood normal.         Behavior: Behavior normal.         Thought Content: Thought content normal.         Judgment: Judgment normal.         Medical Decision Making   Patient seen by me on:  08/21/2019    Assessment:  68 year old male presents the emergency room with calf swelling and pain following TKA on 10/19.    Differential diagnosis:  DVT  Hematoma  Superficial thrombophlebitis  Baker's cyst    Plan:  Orders Placed This Encounter      US doppler vein RIGHT lower extremity      [place] lower extremity non vascular limited RIGHT    Medications - No data to display      ED Course and Disposition:  US doppler vein RIGHT lower extremity   Final Result        1. No evidence of deep venous thrombosis in the right lower extremity.        2. Complex fluid collection in the popliteal fossa extending into the medial calf suggests of the ruptured Baker's cyst.        END OF IMPRESSION.        [facility] submits this DICOM format image data and final report to the [facility], an independent secure electronic health information exchange, on a reciprocally searchable basis (with patient authorization) for a minimum of 12 months after exam     date.     [place] lower extremity non vascular limited RIGHT   Final Result        1. No evidence of deep venous thrombosis in the right lower extremity.        2. Complex fluid collection in the popliteal fossa extending into the medial calf suggests of the ruptured Baker's cyst.        END OF IMPRESSION.        [facility] submits this DICOM format image data and final report to the [facility], an independent secure electronic health information exchange, on a reciprocally searchable basis (with patient authorization) for a minimum of 12 months after exam     date.       Discussed patient with orthopedic on call.  He appears to have a ruptured Baker's cyst on ultrasound with an extensive fluid collection in the proximal calf.  He has good range of motion of the knee  without signs of infection or erythema.  I talked to him about elevation, compression and icing.  Orthopedics is good with him going home and will have Dr. Jacques Navy office follow-up if needed.  Patient counseled on diagnosis and treatment.  Return precautions advised.            Yevette Edwards, NP          Rakayla Ricklefs, Janne Napoleon, NP  08/21/19 1436

## 2019-08-21 NOTE — Discharge Instructions (Signed)
Please follow-up with Dr. Randol Kern as scheduled.  If your symptoms become worse call their office to be seen or return to the emergency room for evaluation.    Continue taking acetaminophen and Celebrex at home.

## 2019-08-21 NOTE — ED Triage Notes (Addendum)
Pt to ED with increased swelling, and pain to RLE. Pt had right knee replacement 3 weeks ago. Pt reports s/s started yesterday. Taking 81mg  2 times daily.        Triage Note   Shanon Payor, RN

## 2019-08-21 NOTE — ED Notes (Signed)
Pt. To Ultrasound

## 2019-08-21 NOTE — ED Notes (Signed)
AVS reviewed with patient at time of discharge. Directions related to need for orthopedic / PCP follow up outline and highlighted. Patient in agreement with plan communicated. No questions/concerns voiced prior to departure. Patient arranged for family to pick transportation home via car. Patient exited the unit without incident at time of departure.

## 2019-08-23 ENCOUNTER — Encounter: Payer: Self-pay | Admitting: Orthopedic Surgery

## 2019-08-24 ENCOUNTER — Encounter: Payer: Self-pay | Admitting: Gastroenterology

## 2019-08-25 ENCOUNTER — Encounter: Payer: Self-pay | Admitting: Gastroenterology

## 2019-08-25 ENCOUNTER — Telehealth: Payer: Self-pay | Admitting: Orthopedic Surgery

## 2019-08-25 DIAGNOSIS — Z96651 Presence of right artificial knee joint: Secondary | ICD-10-CM

## 2019-08-25 NOTE — Telephone Encounter (Signed)
Pt would like a script for outside PT at [facility] in [place]. I will fax it when it is in  Thank you

## 2019-08-25 NOTE — Telephone Encounter (Signed)
Prescription for physical therapy provided as requested.

## 2019-08-28 ENCOUNTER — Other Ambulatory Visit: Payer: Self-pay | Admitting: Orthopedic Surgery

## 2019-08-28 ENCOUNTER — Encounter: Payer: Self-pay | Admitting: Gastroenterology

## 2019-08-28 MED ORDER — CELECOXIB 200 MG PO CAPS *I*
200.0000 mg | ORAL_CAPSULE | Freq: Two times a day (BID) | ORAL | 0 refills | Status: AC
Start: 2019-08-28 — End: 2019-09-11

## 2019-08-29 ENCOUNTER — Encounter: Payer: Self-pay | Admitting: Neurology

## 2019-08-29 DIAGNOSIS — F419 Anxiety disorder, unspecified: Secondary | ICD-10-CM

## 2019-08-30 MED ORDER — DIAZEPAM 5 MG PO TABS *I*
ORAL_TABLET | ORAL | 0 refills | Status: DC
Start: 2019-08-30 — End: 2020-01-22

## 2019-08-30 NOTE — Telephone Encounter (Signed)
Update Care Everywhere    Per ISTOP, date of last dispense:  Nothing noted for diazepam    Date of last visit and plan for FUV:  12/06/2018, 6-12 months  Date of FUV?  Not scheduled  Date of last MRI and plan for next: 01/27/2017  Is MRI ordered? Scheduled 11/14/2018  DMT (include alternate dosing if applicable):  Labs:      Lab results: 08/01/19  1657   Sodium 139   Potassium 4.0   Chloride 100   CO2 30*   UN 17   Creatinine 1.04   GFR,Caucasian 73   GFR,Black 84   Glucose 132*   Calcium 8.8             Lab results: 07/10/19  1111 05/19/19  0803   Total Protein 6.4 6.3   Albumin 4.3 4.4   ALT 28 40   AST 41 38   Alk Phos 61 59   Bilirubin,Total 0.7 0.6   Bilirubin,Direct  --  <0.2           Lab results: 08/01/19  1657 07/10/19  1111 07/10/19  1111 05/19/19  0803   WBC  --   --  5.3 6.1   Hemoglobin 11.9*   < > 14.4 15.1   Hematocrit 36*   < > 42 44   RBC  --   --  4.7 4.9   Platelets  --   --  183 198   Neut # K/uL  --   --   --  3.9   Lymph # K/uL  --   --   --  1.4   Mono # K/uL  --   --   --  0.6   Eos # K/uL  --   --   --  0.1   Baso # K/uL  --   --   --  0.0   Seg Neut %  --   --   --  64.0   Lymphocyte %  --   --   --  22.5   Monocyte %  --   --   --  10.3   Eosinophil %  --   --   --  2.0   Basophil %  --   --   --  0.7    < > = values in this interval not displayed.

## 2019-09-06 ENCOUNTER — Other Ambulatory Visit: Payer: Self-pay | Admitting: Neurology

## 2019-09-06 DIAGNOSIS — G35 Multiple sclerosis: Secondary | ICD-10-CM

## 2019-09-06 MED ORDER — DALFAMPRIDINE 10 MG PO TB12 *I*
10.0000 mg | ORAL_TABLET | Freq: Two times a day (BID) | ORAL | 5 refills | Status: DC
Start: 2019-09-06 — End: 2020-04-22

## 2019-09-06 NOTE — Telephone Encounter (Signed)
Date of last visit and plan for FUV: 12/06/18 - 6/12 months  Date of FUV?  11/17/19  Date of last MRI and plan for next: 01/2017 - 08/2019  Is MRI ordered? Yes scheduled 09/14/19  DMT (include alternate dosing if applicable):Tecfidera  Labs:      Lab results: 08/01/19  1657   Sodium 139   Potassium 4.0   Chloride 100   CO2 30*   UN 17   Creatinine 1.04   GFR,Caucasian 73   GFR,Black 84   Glucose 132*   Calcium 8.8             Lab results: 07/10/19  1111 05/19/19  0803   Total Protein 6.4 6.3   Albumin 4.3 4.4   ALT 28 40   AST 41 38   Alk Phos 61 59   Bilirubin,Total 0.7 0.6   Bilirubin,Direct  --  <0.2           Lab results: 08/01/19  1657 07/10/19  1111 07/10/19  1111 05/19/19  0803   WBC  --   --  5.3 6.1   Hemoglobin 11.9*   < > 14.4 15.1   Hematocrit 36*   < > 42 44   RBC  --   --  4.7 4.9   Platelets  --   --  183 198   Neut # K/uL  --   --   --  3.9   Lymph # K/uL  --   --   --  1.4   Mono # K/uL  --   --   --  0.6   Eos # K/uL  --   --   --  0.1   Baso # K/uL  --   --   --  0.0   Seg Neut %  --   --   --  64.0   Lymphocyte %  --   --   --  22.5   Monocyte %  --   --   --  10.3   Eosinophil %  --   --   --  2.0   Basophil %  --   --   --  0.7    < > = values in this interval not displayed.

## 2019-09-06 NOTE — Telephone Encounter (Signed)
Refill request

## 2019-09-12 ENCOUNTER — Ambulatory Visit: Payer: Medicare (Managed Care) | Admitting: Orthopedic Surgery

## 2019-09-12 ENCOUNTER — Ambulatory Visit
Admission: RE | Admit: 2019-09-12 | Discharge: 2019-09-12 | Disposition: A | Discharge status: Home / Self Care | Payer: Medicare (Managed Care) | Source: Ambulatory Visit | Attending: Orthopedic Surgery | Admitting: Orthopedic Surgery

## 2019-09-12 ENCOUNTER — Encounter: Payer: Self-pay | Admitting: Orthopedic Surgery

## 2019-09-12 VITALS — BP 120/82 | Ht 71.0 in | Wt 164.0 lb

## 2019-09-12 DIAGNOSIS — M25561 Pain in right knee: Secondary | ICD-10-CM | POA: Insufficient documentation

## 2019-09-12 DIAGNOSIS — Z96651 Presence of right artificial knee joint: Secondary | ICD-10-CM

## 2019-09-12 NOTE — Progress Notes (Signed)
ADULT POST OP TKR    [name] presents today for a post-op visit following aright total knee arthroplasty performed 07/31/19.  Patient was last evaluated through telemedicine visit on a 08/15/19.  He continues with outpatient physical therapy as per THR protocol.  Doppler ultrasound of the right lower extremity obtained on 08/21/27 was negative for DVT, suggested the presence of a rupturing Baker's cyst.  He rates his knee pain as a 4 out of 10, uses Tylenol for pain management.  He does use a cane when outdoors.  He works from home as a Engineer, maintenance (IT).  He is able to ambulate up stairs in a reciprocal basis.  He denies an interval fever or chill history.    Exam: Well-developed, well-nourished, healthy-appearing 68 year old male in no apparent distress.  Mood and affect appropriate.  Sensorium clear; oriented X 3. Patient seated comfortably in an examining room chair.  Able to arise independently from a seated position.  Normal cadence gait without observable limp.  Right knee exam demonstrates neutral knee alignment.  Active assisted knee range of motion 0-110.  The knee joint is grossly stable.  Mild knee effusion without unusual warmth or redness present.  Surgical incision well healed without signs of infection.  Calf is soft and nontender, distal extremity CMS is intact.    Radiographs were obtained and personally reviewed. These show a total knee arthroplasty  with normal alignment.    Impression: Right TKR-07/31/19.     Plan:  The patient's x-ray findings and underlying diagnosis reviewed.  Patient was seen and evaluated by Dr.Ginnetti as well.  Patient congratulated regarding ongoing postoperative improvement.  Patient will continue with supervised physical therapy as per TKR protocol, gradually transition to HEP when comfortable to do so.  Importance of following dental antibiotic prophylaxis precautions reviewed. Acceptable low-impact exercise activities were once again reviewed.  All ambulatory activities to be  performed within the confines of comfort.  Additional questions invited and answered.      Follow up at 6 months postop or sooner when necessary for reexamination of right knee.    Orders Next Visit: None unless clinically warranted.  Answers for HPI/ROS submitted by the patient on 09/09/2019   RIGHT KNEE PAIN HPI  What is your goal for today's visit?: REVIEW KNEE REPLACEMENT  Date of onset: : 07/31/2019  Was this the result of an injury?: No  What is your pain level?: 4/10  Please describe the quality of your pain: : discomfort  What diagnostic workup have you had for this condition?: X-ray  What treatments have you tried for this condition?: acetominophen, gabapentin, ice, physical therapy  Progression since onset: : gradually improving  Is this a work related condition? : No  Current work status: : usual activities  Fever: No  Chills: No  Numbness: No  Tingling: No

## 2019-09-14 ENCOUNTER — Ambulatory Visit
Admission: RE | Admit: 2019-09-14 | Discharge: 2019-09-14 | Disposition: A | Discharge status: Home / Self Care | Payer: Medicare (Managed Care) | Source: Ambulatory Visit

## 2019-09-14 DIAGNOSIS — G35 Multiple sclerosis: Secondary | ICD-10-CM

## 2019-09-18 ENCOUNTER — Telehealth: Payer: Self-pay | Admitting: Neurology

## 2019-09-18 DIAGNOSIS — G35 Multiple sclerosis: Secondary | ICD-10-CM

## 2019-09-18 NOTE — Telephone Encounter (Signed)
09/14/2019 MRI Head stable, MRI thoracic with at least three new lesions.  Will defer to MD.

## 2019-09-18 NOTE — Telephone Encounter (Signed)
Pt calling wondering if he can have a call regarding his recebt MRI. Pt is scheduled for his 75m FUV on 2/5 but is wondering if theres anyway that he can know the results sooner.

## 2019-09-20 ENCOUNTER — Other Ambulatory Visit: Payer: Self-pay | Admitting: Gastroenterology

## 2019-09-22 NOTE — Telephone Encounter (Signed)
Pt notes that his balance is "not as great". He had a knee replacement recently and is recovering from this. He did not play tennis last summer because of decreased balance/stability.    We discussed that he has new lesions on thoracic MRI. Encouraged switching to stronger DMT, such as TYsabri (even just for 2 years during [place]). He would like this even if JC+ during [place] pandemic with understanding of need to switch before 2 yrs of use due to increased PML risk.    Will test JC index, CMP, CBC.      NI Staff, please schedule a telemed visit with me Mon early in Jan.    [facility], please mail JC index test slip, Tysabri enrollment form and monoclonal consent to pt.

## 2019-09-25 NOTE — Telephone Encounter (Signed)
Sent my chart message to patient to get appointment scheduled with Dr.Robb

## 2019-09-26 ENCOUNTER — Other Ambulatory Visit
Admission: RE | Admit: 2019-09-26 | Discharge: 2019-09-26 | Disposition: A | Discharge status: Home / Self Care | Payer: Medicare (Managed Care) | Source: Ambulatory Visit | Attending: Neurology | Admitting: Neurology

## 2019-09-26 DIAGNOSIS — G35 Multiple sclerosis: Secondary | ICD-10-CM | POA: Insufficient documentation

## 2019-09-26 LAB — COMPREHENSIVE METABOLIC PANEL
ALT: 17 U/L (ref 0–50)
AST: 23 U/L (ref 0–50)
Albumin: 4.6 g/dL (ref 3.5–5.2)
Alk Phos: 69 U/L (ref 40–130)
Anion Gap: 9 (ref 7–16)
Bilirubin,Total: 0.7 mg/dL (ref 0.0–1.2)
CO2: 30 mmol/L — ABNORMAL HIGH (ref 20–28)
Calcium: 9.4 mg/dL (ref 8.6–10.2)
Chloride: 102 mmol/L (ref 96–108)
Creatinine: 1.02 mg/dL (ref 0.67–1.17)
GFR,Black: 86 *
GFR,Caucasian: 74 *
Glucose: 100 mg/dL — ABNORMAL HIGH (ref 60–99)
Lab: 16 mg/dL (ref 6–20)
Potassium: 4.1 mmol/L (ref 3.3–5.1)
Sodium: 141 mmol/L (ref 133–145)
Total Protein: 6.8 g/dL (ref 6.3–7.7)

## 2019-09-26 LAB — CBC AND DIFFERENTIAL
Baso # K/uL: 0 10*3/uL (ref 0.0–0.1)
Basophil %: 0.8 %
Eos # K/uL: 0.1 10*3/uL (ref 0.0–0.5)
Eosinophil %: 2.3 %
Hematocrit: 42 % (ref 40–51)
Hemoglobin: 14.6 g/dL (ref 13.7–17.5)
IMM Granulocytes #: 0 10*3/uL (ref 0.0–0.0)
IMM Granulocytes: 0.4 %
Lymph # K/uL: 1.2 10*3/uL — ABNORMAL LOW (ref 1.3–3.6)
Lymphocyte %: 22 %
MCH: 31 pg/cell (ref 26–32)
MCHC: 35 g/dL (ref 32–37)
MCV: 89 fL (ref 79–92)
Mono # K/uL: 0.6 10*3/uL (ref 0.3–0.8)
Monocyte %: 11.4 %
Neut # K/uL: 3.4 10*3/uL (ref 1.8–5.4)
Nucl RBC # K/uL: 0 10*3/uL (ref 0.0–0.0)
Nucl RBC %: 0 /100 WBC (ref 0.0–0.2)
Platelets: 217 10*3/uL (ref 150–330)
RBC: 4.7 MIL/uL (ref 4.6–6.1)
RDW: 12.2 % (ref 11.6–14.4)
Seg Neut %: 63.1 %
WBC: 5.3 10*3/uL (ref 4.2–9.1)

## 2019-09-26 LAB — PHOSPHORUS: Phosphorus: 3.8 mg/dL (ref 2.7–4.5)

## 2019-09-26 LAB — BILIRUBIN, DIRECT: Bilirubin,Direct: <0.2 mg/dL (ref 0.0–0.3)

## 2019-09-27 ENCOUNTER — Encounter: Payer: Self-pay | Admitting: Gastroenterology

## 2019-09-28 ENCOUNTER — Encounter: Payer: Self-pay | Admitting: Orthopedic Surgery

## 2019-09-29 ENCOUNTER — Encounter: Payer: Self-pay | Admitting: Gastroenterology

## 2019-10-04 ENCOUNTER — Encounter: Payer: Self-pay | Admitting: Neurology

## 2019-10-09 LAB — STRATIFY JCV AB INHIBITION ASSAY ASSAY: Stratify JCV inhibition: NEGATIVE

## 2019-10-09 LAB — STRATIFY JCV W/INDEX W/REFLEX TO INHIBITION
STJIX - Index Value: 0.33
Stratify JCV AB with reflex to inhibition: UNDETERMINED

## 2019-10-10 ENCOUNTER — Telehealth: Payer: Self-pay | Admitting: Neurology

## 2019-10-10 NOTE — Telephone Encounter (Signed)
Yes, medically ready to start Tysabri.     Should take Tecfidera until the day before initial Tysabir infusion.

## 2019-10-10 NOTE — Telephone Encounter (Signed)
-----   Message from Lyndel Safe sent at 10/10/2019  8:47 AM EST -----  Regarding: FW: Hoy Finlay for Tysabri?  ----- Message -----  From: Areta Haber  Sent: 10/09/2019   2:14 PM EST  To: Lyndel Safe  Subject: RE: Dwyane Dee  pa                                   Prior auth not required Tysabri per [employer].  I updated auth info in patient chart - thanks, jenn   ----- Message -----  From: Lyndel Safe  Sent: 10/09/2019   9:17 AM EST  To: Areta Haber  Subject: Mannie Stabile    Can you obtain Dalphine Handing ID [id number]    Thanks

## 2019-10-11 NOTE — Telephone Encounter (Signed)
Spoke with [name] to schedule Tysabri and he would like to wait until his televisit with Dr. Thamas Jaegers on 10/16/19 to schedule.

## 2019-10-12 ENCOUNTER — Encounter: Payer: Self-pay | Admitting: Primary Care

## 2019-10-15 ENCOUNTER — Other Ambulatory Visit: Payer: Self-pay | Admitting: Urology

## 2019-10-15 NOTE — Progress Notes (Signed)
Multiple Sclerosis Clinic Follow-up Visit  Video visit  Pt at home  Provider at home office  No other participants      Subjective:  [name] is a 69 y.o. M here for a regular follow-up of relapsing remitting MS. Since his last clinic visit he has dealt with balance issues, that have worsened gradually in recent months. In Feb he was playing tennis. Then he realized in May he realized that his balance was not good enough to play tennis (there was a gap in playing due to [place] pandemic).     +R thigh "spasms"/abnormal sensation   Ampyra    +Hand/foot tingling  He continues working a Dealer.    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    MS History:  Clinical presentations:   1990's R arm weakness   Later numbness hands and feet and stereotyped abnormal distal R thigh sensation  MRIs:   4/08 brain-relatively mild lesion burden, 1 enhancing lesion at the time   5/13 brain-no new lesions   11/14 brain-unchanged   10/16 brain-unchanged   4/18 brain-unchanged                 Thoracic-few, non-enhancing intramedullary lesions   12/20 brain-unchanged                  Thoracic cord- at least 3 new intramedullary lesions (no contrast given)  Other testing:   12/20 JC index 0.33, indeterminate to negative  Disease modifying treatment hx:   Betaseron started in 1990's, stopped due to injection site reactions/infections   Tecfidera started 5/13 and continued through the present   Discussed changing to Tysabri  Symptomatic treatment hx:   ED-Cialis helps   Foot cramps-baclofen can be helpful   Stereotyped R thigh abnormal sensation-less bothersome, not certain if Tegretol helped   Knee pain-acupuncture    Past Medical History:    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 Never smoker.       Meds:  Current Outpatient Medications on File Prior to Visit   Medication Sig Dispense Refill    dalfampridine (AMPYRA) 10 MG tablet Take 1 tablet (10 mg total) by mouth 2 times daily 60 tablet 5    diazePAM (VALIUM) 5 MG tablet May take one tablet 60 minutes before MRI, take second tablet 30 minutes before MRI and take third tablet just before MRI, as needed. 3 tablet 0    acetaminophen (TYLENOL) 325 MG tablet Take 1 tablet (325 mg total) by mouth 3 times daily 100 tablet 0    gabapentin (NEURONTIN) 300 MG capsule Take one capsule by mouth at bedtime.  May increase to three times daily if tolerated. 90 capsule 5    dimethyl fumarate (TECFIDERA) 240 mg DR capsule Take 1 capsule (240 mg total) by mouth 2 times daily 180 capsule 1    tadalafil (CIALIS) 5 MG tablet TAKE 1 TABLET BY MOUTH EVERY DAY 90 tablet 1    cholecalciferol (VITAMIN D) 1000 UNIT tablet Take 1,000 Units by mouth daily      Cyanocobalamin (VITAMIN B-12 CR PO) Take 1,000 mcg by mouth daily          No current facility-administered medications on file prior to visit.         ROS:  10+ROS reviewed. Pertinent positives and negatives in HPI.     Vitals  There were no vitals taken for this visit.    PE:  Gen: Well appearing, NAD.  MS: Alert. Fluent. Affect appropriate for situation.  CN:   Choppy pursuits. Full facial strength. Hearing full to soft voice.   Motor: No pronator drift.Tic like movements of head.  Coord: No ataxia on FTN. FInger taps normal.  Gait: Wide-based casual gait. Difficulty completing tandem  gait.    Assesment and Plan:  69 y.o. M with RRMS found to have new thoracic cord lesions in the setting of worsening of gait and balance. Discussed switching to higher potency DMT, such as Tysabri. He is concerned about making this switch during the COVID pandemic. Will discuss again at 4/21 telemed FUA.  For now, continue with Tecfidera. He is aware that this is not potent enough given development of new cord lesions.  Discussed risks of Tysabri including serious infection or allergic reaction.  MRI brain and thoracic cord 6/21, wide bore scanner. Can prescribe Valium 5mg  x 4 pills to help with prominent claustrophobia, if he has a driver. He will ask for this closer to the MRI.      Deno Sida, MD  Neuroimmunology    The plan was discussed with the patient and the patient/patient rep demonstrated understanding to the provider's satisfaction.    Consent was previously obtained from the patient to complete this video consult; including the potential for financial liability.    27 minutes spent with patient, >50% spent in counseling and coordination of care.

## 2019-10-16 ENCOUNTER — Encounter: Payer: Self-pay | Admitting: Gastroenterology

## 2019-10-16 ENCOUNTER — Telehealth: Payer: Self-pay

## 2019-10-16 ENCOUNTER — Ambulatory Visit: Payer: Medicare (Managed Care) | Attending: Neurology | Admitting: Neurology

## 2019-10-16 DIAGNOSIS — R269 Unspecified abnormalities of gait and mobility: Secondary | ICD-10-CM | POA: Insufficient documentation

## 2019-10-16 DIAGNOSIS — G35 Multiple sclerosis: Secondary | ICD-10-CM | POA: Insufficient documentation

## 2019-10-16 NOTE — Telephone Encounter (Signed)
Patient is to take his Tecfidera up until 1 day before his 1st Tysabri infusion on 1/11

## 2019-10-17 ENCOUNTER — Other Ambulatory Visit: Payer: Self-pay | Admitting: Adult Health

## 2019-10-17 ENCOUNTER — Telehealth: Payer: Self-pay

## 2019-10-17 NOTE — Telephone Encounter (Signed)
Need Tysabri orders, NOT on free drug

## 2019-10-18 ENCOUNTER — Encounter: Payer: Self-pay | Admitting: Neurology

## 2019-10-18 NOTE — Telephone Encounter (Signed)
Pt will have monthly co-pay of $900 to continue Tysabri.  Please advise.

## 2019-10-20 ENCOUNTER — Telehealth: Payer: Self-pay

## 2019-10-20 ENCOUNTER — Other Ambulatory Visit: Payer: Self-pay | Admitting: Neurology

## 2019-10-20 MED ORDER — NATALIZUMAB 300 MG/15ML IV CONC *I*
300.0000 mg | Freq: Once | INTRAVENOUS | 5 refills | Status: DC
Start: 2019-10-20 — End: 2019-10-26

## 2019-10-20 NOTE — Telephone Encounter (Signed)
Patient is now on free drug from [place] We could not confirm if the medication would be here Monday or not but patient wanted to remain on the scheduled until we have an official answer if we can either use a replacement or if we received the meds. I will call him Monday morning.

## 2019-10-20 NOTE — Telephone Encounter (Signed)
Will defer pt's MyChart questions to [facility].

## 2019-10-20 NOTE — Telephone Encounter (Signed)
Free drug through [facility] specialty pharmacy, infusion is Monday please expedite.

## 2019-10-23 ENCOUNTER — Ambulatory Visit: Payer: Medicare (Managed Care) | Attending: Adult Health

## 2019-10-23 VITALS — BP 121/78 | HR 65 | Temp 96.8°F | Resp 16

## 2019-10-23 DIAGNOSIS — G35 Multiple sclerosis: Secondary | ICD-10-CM | POA: Insufficient documentation

## 2019-10-23 MED ORDER — SODIUM CHLORIDE 0.9 % IV SOLN WRAPPED *I*
300.0000 mg | Freq: Once | INTRAVENOUS | Status: AC
Start: 2019-10-23 — End: 2019-10-23
  Administered 2019-10-23: 300 mg via INTRAVENOUS
  Filled 2019-10-23: qty 15

## 2019-10-23 NOTE — Telephone Encounter (Signed)
Patient is now getting FREE drug from Specialty please change orders. appt is today.

## 2019-10-23 NOTE — Progress Notes (Signed)
Patient arrived for Tysabri infusion #1 in usual state of health. Pt was oriented to infusion center and Tysabri infusion protocol . Tysabri started @1504  and completed @1604  .  Pt tolerated infusion well, and was discharged home stable after one hour observation. AVS and discharge instructions given and reviewed. 

## 2019-10-23 NOTE — Telephone Encounter (Signed)
I called [name] and he is still interested and comfortable coming in for treatment.  I completed the COVID-19 screen, patient confirmed that they have not had any exposure and does not have any symptoms.  I explained that we are taking extra precautions and using some social distancing practices and patient knows to call if they start to develop symptoms or learns that they have come in contact with a confirmed case.      Will be able to use a free drug replacement.

## 2019-10-23 NOTE — Patient Instructions (Signed)
You must follow up with a provider 3 months after your first dose, and every six months thereafter.     MRIs are performed yearly unless you are JCV+ and >24 infusions, then MRIs are every 3 months.    If follow up visits and MRI apppointments are not up to date, we may be required to hold treatment.  This will be decided by your Neurologist.    We will draw your JCV and liver function labs every 3 months.    Prior to each infusion you will be asked about any new or worsening symptoms related to your Multiple Sclerosis.  This includes things such as sudden changes in thinking, eyesight, balance, or strength, etc.  Please review pages 6-7 of your Patient Medication Guide.    You should not get pregnant while on Tysabri.  Women of child bearing age will need to know the date of their last menstrual period.  If you cannot recall the date, we will perform a urine pregnancy test.    Please do not hesitate to call the patient line, [phone] with any questions.      If you have an insurance changes, please notify the office at [phone] as benefits must be verified.    COVID-19 Prevention  We are reminding patients to continue to do the following in order to stay healthy (per CDC guidelines):      Wash your hands often with soap and water for at least 20 seconds.  Avoid touching your eyes, nose, and mouth.  Stay hydrated and get plenty of rest.  Avoid close contact with people who are sick.  When you are sick, stay home.  Cover your cough or sneeze with a tissue and throw the tissue in the trash.  Stay home as much as possible to reduce your risk of being exposed.  If you do go out in public, stay at least six feet away from others who are sick, limit close contact and wash your hands often.  Avoid cruise ships and non-essential travel such as long plane rides.  Get your flu shot if you have not already.  It's not too late!!    Work  There is no absolute contraindication to going to work.  If you feel uncomfortable going to  work, we will support your decision to either work from home, or remain out.   We will be happy to write a letter or complete paperwork indicating the same.    What should I do if I get COVID-19?  With any virus you should rest, stay hydrated, and treat your symptoms with over the counter medications.  Call your primary care doctor for further instructions.    [facility] COVID-19 Patient Hotline (888)-928-0011  [place] Department of Public Health COVID-19 Hotline (585) 753-5555, internet: https://www2.[place].gov/health-coronavirus or email:  COVID19@[place].gov

## 2019-10-24 ENCOUNTER — Encounter: Payer: Self-pay | Admitting: Primary Care

## 2019-10-25 ENCOUNTER — Telehealth: Payer: Self-pay | Admitting: Neurology

## 2019-10-25 NOTE — Telephone Encounter (Signed)
Patient's pharmacy told him that his tysabri still needs authorization however patient thought everything has already been taken care of. Please advise, [phone]

## 2019-10-25 NOTE — Telephone Encounter (Signed)
Will defer to [facility] for assistance.

## 2019-10-25 NOTE — Telephone Encounter (Signed)
Called [name] contacted him and said that it needed prior auth. I then contacted [facility] and they were processing it as a standard script. They handle free drug from a different account. [facility] needs to provide [place] with a site id and they need to send the script to them. There is no patient information related to free drug. I let [name] know that we will get this sorted and that he does not need to take any further actions at this time.

## 2019-10-26 ENCOUNTER — Other Ambulatory Visit: Payer: Self-pay | Admitting: Neurology

## 2019-10-26 MED ORDER — NATALIZUMAB 300 MG/15ML IV CONC *I*
300.0000 mg | Freq: Once | INTRAVENOUS | 5 refills | Status: DC
Start: 2019-10-26 — End: 2019-11-07

## 2019-10-26 NOTE — Telephone Encounter (Signed)
Please sign script again as we had to revise with out site ID

## 2019-11-07 ENCOUNTER — Other Ambulatory Visit: Payer: Self-pay | Admitting: Neurology

## 2019-11-07 NOTE — Telephone Encounter (Signed)
Kyra calling from [facility] stating the script for tysabri that they received didn't have MD signature and they either need it resent with a signature or a verbal request.     Phone: [phone] ext [phone]

## 2019-11-10 MED ORDER — NATALIZUMAB 300 MG/15ML IV CONC *I*
300.0000 mg | Freq: Once | INTRAVENOUS | 5 refills | Status: DC
Start: 2019-11-10 — End: 2020-07-26

## 2019-11-13 ENCOUNTER — Encounter: Payer: Self-pay | Admitting: Primary Care

## 2019-11-13 DIAGNOSIS — G35 Multiple sclerosis: Secondary | ICD-10-CM

## 2019-11-13 DIAGNOSIS — J45909 Unspecified asthma, uncomplicated: Secondary | ICD-10-CM

## 2019-11-13 DIAGNOSIS — Z9109 Other allergy status, other than to drugs and biological substances: Secondary | ICD-10-CM

## 2019-11-15 ENCOUNTER — Encounter: Payer: Self-pay | Admitting: Primary Care

## 2019-11-17 ENCOUNTER — Ambulatory Visit: Payer: Medicare (Managed Care) | Admitting: Neurology

## 2019-11-17 ENCOUNTER — Telehealth: Payer: Self-pay

## 2019-11-17 NOTE — Telephone Encounter (Signed)
I called and spoke to [name].  He decided to reschedule his physical to 02/06/20, then chose not to set up a Zoom visit for 01/25/20, said he was doing fine overall.    [name] mentioned getting his 1st Covid shot on 11/03/19, is scheduled for the 2nd shot on 11/24/19.

## 2019-11-17 NOTE — Telephone Encounter (Signed)
I called [name] and he is still interested and comfortable coming in for treatment.  I completed the COVID-19 screen, patient confirmed that they have not had any exposure and does not have any symptoms.  I explained that we are taking extra precautions and using some social distancing practices and patient knows to call if they start to develop symptoms or learns that they have come in contact with a confirmed case.    Spoke with patient, answered the following questions:   Have you recently had a Covid vaccine or are you planning on getting one soon? Yes  Do you feel like you could have an infection or are you taking antibiotics for an infection? No      Please advise as patient received his first vaccine shot on 1/22 and will be getting his second on the 2/12. Ok for infusion on Monday?

## 2019-11-17 NOTE — Telephone Encounter (Signed)
I verbally let [name] know, she will get things changed.  FYI to [employer]

## 2019-11-17 NOTE — Telephone Encounter (Signed)
Tysabri scheduled 2/8, getting 2nd covid vaccine 2/12, ok to infuse Monday? Will only be his 2nd Tysabri

## 2019-11-20 ENCOUNTER — Ambulatory Visit: Payer: Medicare (Managed Care) | Attending: Adult Health

## 2019-11-20 VITALS — BP 112/71 | HR 63 | Temp 98.8°F | Resp 16

## 2019-11-20 DIAGNOSIS — G35 Multiple sclerosis: Secondary | ICD-10-CM | POA: Insufficient documentation

## 2019-11-20 MED ORDER — SODIUM CHLORIDE 0.9 % IV SOLN WRAPPED *I*
300.0000 mg | Freq: Once | INTRAVENOUS | Status: AC
Start: 2019-11-20 — End: 2019-11-20
  Administered 2019-11-20: 300 mg via INTRAVENOUS
  Filled 2019-11-20: qty 15

## 2019-11-20 NOTE — Progress Notes (Signed)
Patient arrived for Tysabri infusion # 2 fully ambulatory. Has foot tingling and mildly unsteady gait. Had right knee replaced and reports stiffness in right leg.  Started @ 1310  and completed @ 1410 .  AVS / Discharge instructions reviewed with patient including COVID teaching. Pt tolerated infusion well, and was discharged home stable.

## 2019-11-20 NOTE — Patient Instructions (Signed)
You must follow up with a provider 3 months after your first dose, and every six months thereafter.     MRIs are performed yearly unless you are JCV+ and >24 infusions, then MRIs are every 3 months.    If follow up visits and MRI apppointments are not up to date, we may be required to hold treatment.  This will be decided by your Neurologist.    We will draw your JCV and liver function labs every 3 months.    Prior to each infusion you will be asked about any new or worsening symptoms related to your Multiple Sclerosis.  This includes things such as sudden changes in thinking, eyesight, balance, or strength, etc.  Please review pages 6-7 of your Patient Medication Guide.    You should not get pregnant while on Tysabri.  Women of child bearing age will need to know the date of their last menstrual period.  If you cannot recall the date, we will perform a urine pregnancy test.    Please do not hesitate to call the patient line, [phone] with any questions.      If you have an insurance changes, please notify the office at [phone] as benefits must be verified.    COVID-19 Prevention  We are reminding patients to continue to do the following in order to stay healthy (per CDC guidelines):      Wash your hands often with soap and water for at least 20 seconds.  Avoid touching your eyes, nose, and mouth.  Stay hydrated and get plenty of rest.  Avoid close contact with people who are sick.  When you are sick, stay home.  Cover your cough or sneeze with a tissue and throw the tissue in the trash.  Stay home as much as possible to reduce your risk of being exposed.  If you do go out in public, stay at least six feet away from others who are sick, limit close contact and wash your hands often.  Avoid cruise ships and non-essential travel such as long plane rides.  Get your flu shot if you have not already.  It's not too late!!    Work  There is no absolute contraindication to going to work.  If you feel uncomfortable going to  work, we will support your decision to either work from home, or remain out.   We will be happy to write a letter or complete paperwork indicating the same.    What should I do if I get COVID-19?  With any virus you should rest, stay hydrated, and treat your symptoms with over the counter medications.  Call your primary care doctor for further instructions.    [facility] COVID-19 Patient Hotline (888)-928-0011  [place] Department of Public Health COVID-19 Hotline (585) 753-5555, internet: https://www2.[place].gov/health-coronavirus or email:  COVID19@[place].gov

## 2019-11-21 NOTE — Telephone Encounter (Signed)
I don't see mention of referral in last few office notes, please advise

## 2019-11-23 ENCOUNTER — Telehealth: Payer: Self-pay

## 2019-11-23 NOTE — Telephone Encounter (Signed)
Copied from CRM [id number]. Topic: Return Call - Schedule Appointment  >> Nov 23, 2019  9:01 AM Elesa Hacker wrote:  [name] is returning a call from [facility] (Referral Coordinator).   He states this is regarding scheduling NPV.  Patient can be reached at: [phone].

## 2019-11-23 NOTE — Telephone Encounter (Signed)
Spoke with patient and scheduled NPV on 01/10/20

## 2019-11-27 ENCOUNTER — Encounter: Payer: Medicare (Managed Care) | Admitting: Primary Care

## 2019-11-28 ENCOUNTER — Encounter: Payer: Self-pay | Admitting: Neurology

## 2019-12-15 ENCOUNTER — Telehealth: Payer: Self-pay

## 2019-12-15 NOTE — Telephone Encounter (Signed)
I called [name] and he is still interested and comfortable coming in for treatment.  I completed the COVID-19 screen, patient confirmed that they have not had any exposure and does not have any symptoms.  I explained that we are taking extra precautions and using some social distancing practices and patient knows to call if they start to develop symptoms or learns that they have come in contact with a confirmed case.  Spoke with patient, answered the following questions:   Have you recently had a Covid vaccine or are you planning on getting one soon? Yes, received second dose a month ago  Do you feel like you could have an infection or are you taking antibiotics for an infection? No  No insurance changes

## 2019-12-18 ENCOUNTER — Ambulatory Visit: Payer: Medicare (Managed Care) | Attending: Adult Health

## 2019-12-18 VITALS — BP 129/79 | HR 61 | Temp 97.9°F | Resp 16

## 2019-12-18 DIAGNOSIS — G35 Multiple sclerosis: Secondary | ICD-10-CM | POA: Insufficient documentation

## 2019-12-18 MED ORDER — SODIUM CHLORIDE 0.9 % IV SOLN WRAPPED *I*
300.0000 mg | Freq: Once | INTRAVENOUS | Status: AC
Start: 2019-12-18 — End: 2019-12-18
  Administered 2019-12-18: 300 mg via INTRAVENOUS
  Filled 2019-12-18: qty 15

## 2019-12-18 NOTE — Progress Notes (Signed)
Patient arrived for Tysabri infusion # 3 in usual state of health.  Tysabri started @0835  and completed @0935  .  Pt tolerated infusion well, and was discharged home stable after one hour observation. 

## 2020-01-10 ENCOUNTER — Ambulatory Visit: Payer: Medicare (Managed Care)

## 2020-01-10 ENCOUNTER — Ambulatory Visit: Payer: Medicare (Managed Care) | Admitting: Allergy/Immunology/Rheumatology

## 2020-01-10 ENCOUNTER — Encounter: Payer: Self-pay | Admitting: Neurology

## 2020-01-10 ENCOUNTER — Encounter: Payer: Self-pay | Admitting: Allergy/Immunology/Rheumatology

## 2020-01-10 VITALS — BP 118/81 | HR 75 | Temp 98.1°F | Ht 71.0 in | Wt 169.6 lb

## 2020-01-10 DIAGNOSIS — R05 Cough: Secondary | ICD-10-CM

## 2020-01-10 DIAGNOSIS — R053 Chronic cough: Secondary | ICD-10-CM

## 2020-01-10 MED ORDER — BUDESONIDE-FORMOTEROL FUMARATE 80-4.5 MCG/ACT IN AERO *I*
INHALATION_SPRAY | RESPIRATORY_TRACT | 5 refills | Status: DC
Start: 2020-01-10 — End: 2020-02-06

## 2020-01-10 NOTE — Patient Instructions (Signed)
1.  Change from Wixela to Symbicort  2.  Return next week for skin testing.  Stay off your Zyrtec/cetirizine until that time  3.  We will make referral to ENT

## 2020-01-10 NOTE — Progress Notes (Signed)
Allergy New Patient Consult Note    PRIMARY CARE PHYSICIAN:  [name], Gerald Faster, MD  REFERRING PHYSICIAN:  Ivory Broad, MD      HPI:   [name] is a 69 y.o. year old Caucasian male who is referred for evaluation of chronic cough.    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 Discussed switching to higher potency DMT, such as Tysabri. He is concerned about making this switch during the [place] pandemic. Will discuss again at 4/21 telemed FUA. For now, continue with Tecfidera. He is aware that this is not potent enough given development of new cord lesions. Discussed risks of Tysabri including serious infection or allergic reaction. Started on Tysabri Jan 2021.    Asthma. Mortezavi , AI and R: [name] says that he coughs only during his allergy season. This year the coughing started in May. He started taking cetirizine 10 mg in the am, Flonase two sprays in each nostril and advair discus one puff bid. He stops all his medications through the winter. Patient does not wish to have repeated skin testing. He does complain that when he starts to take the advair discus he can get hoarse    The patient has had a chronic cough.  Symptoms are much worse during his allergy seasons which are usually the warm months of the year.  In the past 3 years his symptoms have been starting earlier and earlier.  The used to start in May and then resolved with the first frost.  Last year symptoms started in March and this year they started in February.  The patient uses cetirizine, fluticasone nasal spray, and Wixela which is the generic equivalent of Advair disc.  He does not find that any of these medicines help his cough very much.  Instead he thinks that what helps most is cough drops.  He is using diet cough drops to avoid exposure to sugars.  He really has not used his rescue inhaler recently.  He does have some exposure to cats.  They used to have their own cat and  now there cat sitting for their daughter.    He is really not interested in immunotherapy.  However, he would be interested in environmental controls if he turns out to be skin test positive to aeroallergens.    He has not had a significant problem with infections.  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      Enlarged prostate with lower urinary tract symptoms (LUTS) 01/26/2017    Environmental allergies 05.25.18  Extrinsic allergic asthma 11/28/2012    Joyce Gross follows, usually on max therapy in anticipation of bad ragweed season.     HLD (hyperlipidemia)     Hyperlipidemia 05/30/2008    Normal TG, just normal HDL, and LDL as high as 150's at times. No premature CVD in family, mother with CAD late. Started statin in 2014, supplementing with coenzyme Q10 to minimize muscle side effects.     Incomplete Right Bundle Branch Block 05/30/2008          Multiple sclerosis     Osteoarthritis of right knee 02/01/2014    Psoriasis     Thoracic aortic aneurysm (TAA) 02/22/2009    mild- 4.0 cm  by CT scan 5/10 See 6/13 echo at [facility] describing no change, plan for 24 month f/u See [id number] [facility] note. See 04/06/16 echo ([facility]) mild.     Tic disorder      Past Surgical  History:   Procedure Laterality Date    COLONOSCOPY      HERNIA REPAIR      HX TONSILLECTOMY/ADENOIDECTOMY      INCISIONAL HERNIA REPAIR      KNEE ARTHROSCOPY Right     OTHER SURGICAL HISTORY      MS    PR TOTAL KNEE ARTHROPLASTY Right 07/31/2019    Procedure: RIGHT TOTAL KNEE ARTHROPLASTY;  Surgeon: Arley Phenix, MD;  Location: [facility];  Service: Orthopedics    VASECTOMY         CURRENT MEDICATIONS:   Outpatient Encounter Medications as of 01/10/2020   Medication Sig Dispense Refill    cetirizine (ZYRTEC ALLERGY) 10 MG tablet Take 1 tablet by mouth daily      WIXELA INHUB 250-50 MCG/DOSE diskus inhaler Inhale 1 puff into the lungs 2 times daily      fluticasone (FLONASE) 50 MCG/ACT nasal spray Spray 1 spray into nostril daily      natalizumab (TYSABRI) 300 MG/15ML injection Administer 15 mLs (300 mg total) into the vein once for 1 dose [facility] to [facility] on 1st floor.  Site ID [id number] 15 mL 5    tadalafil (CIALIS) 5 MG tablet TAKE 1 TABLET BY MOUTH EVERY DAY 90 tablet 1    dalfampridine (AMPYRA) 10 MG tablet Take 1 tablet (10 mg total) by mouth 2 times daily 60 tablet 5    cholecalciferol (VITAMIN D) 1000 UNIT tablet Take 1,000 Units by mouth daily      Cyanocobalamin (VITAMIN B-12 CR PO) Take 1,000 mcg by mouth daily         diazePAM (VALIUM) 5 MG tablet May take one tablet 60 minutes before MRI, take second tablet 30 minutes before MRI and take third tablet just before MRI, as needed. 3 tablet 0    [DISCONTINUED] dimethyl fumarate (TECFIDERA) 240 mg DR capsule Take 1 capsule (240 mg total) by mouth 2 times daily (Patient not taking: Reported on 01/10/2020) 180 capsule 1     No facility-administered encounter medications on file as of 01/10/2020.         ALLERGIES: Environmental allergies and Environmental [mold]    FAMILY HISTORY:   Family History   Problem Relation Age of Onset    Heart Disease Mother         coronary art disease, not premature    Cancer Father          multiple myeloma    Cancer Maternal Grandmother         unknown  Heart Disease Brother         aneurism-07-09-18    Multiple Sclerosis Neg Hx     Lupus Neg Hx     Rheum arthritis Neg Hx     Thyroid disease Neg Hx     Diabetes Neg Hx     Colon cancer Neg Hx     Colon polyps Neg Hx     High Blood Pressure Neg Hx        SOCIAL HISTORY:   Social History     Socioeconomic History    Marital status: Married     Spouse name: Not on file    Number of children: Not on file    Years of education: Not on file    Highest education level: Not on file   Occupational History    Not on file   Tobacco Use    Smoking status: Never Smoker    Smokeless tobacco: Never Used   Substance and Sexual Activity    Alcohol use: Never     Comment: Very occasionaly    Drug use: Yes     Frequency: 2.0 times per week     Types: Marijuana    Sexual activity: Never   Social History Programmer, 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        Exercise -- doubles tennis or pickle ball up to 5 times weekly, joined [employer] and plans to add some resistance machines.        Diet -- varied, more lean proteins, complex carbs, not a lot of dessert, some greens.  Protein shakes at bkfst and lunch, flax, seeds, yogurt.        Sleep -- active dreams, variable sleep success.  No GU interruption.        Safety -- seatbelt and no distractions, smoke detectors, sunglasses, plans to be more consistent with skin block.     He has recently had knee replacement surgery      PHYSICAL EXAMINATION:  BP 118/81    Pulse 75    Temp 36.7 C (98.1 F) (Temporal)    Ht 1.803 m ('5\' 11"'$ )    Wt 76.9 kg (169 lb 9.6 oz)    SpO2 98%    BMI 23.65 kg/m   Body mass index is 23.65 kg/m.    Pain    01/10/20 1538   PainSc:   2       General: Well-appearing, alert, and in no acute distress    Psych: Normal affect   Skin: no rashes or skin lesions on the face, scalp, neck, trunk, or extremities   Eye: no conjunctival erythema, no eyelid or  periorbial edema, pupils equal and round   ENT: moist mucous membranes, no oral lesions, oropharynx clear, normal salivary pooling, ears normal in size, shape, and position, normal dentition    Some erythema of nasal mucosa and some swelling.  No nasal polyps   TMs and canals okay   Neck: supple, trachea midline, no thyromegaly    Lymph:no cervical adenopathy, no supraclavicular adenopathy    Lungs:breathing comfortably on room air, lungs clear without wheezes, rhonchi, or rales    MW:UXLKGMW rate and rhythm. No murmurs, rubs, or gallops. Normal S1 and S2.    Extremity: equal pulses, no cyanosis   Neurologic: Alert and oriented, mental status normal     IMPRESSION:  #1.  Chronic cough.  The fact that his cough gets better with cough drops suggest  that it may directly be a laryngeal problem.  Certainly the Advair disc or its generic equivalent cause a lot of irritation of the throat and can precipitate coughing.  Therefore would be reasonable for him to switch to Symbicort.  We will send in a prescription.  Other possibilities include postnasal drip, reflux, or neurological problems due to his multiple sclerosis.  We want to test him for allergens since this has not been done in probably 25 years.  Unfortunately he did not stop his cetirizine so his skin testing today was really not adequate.  He will stop the cetirizine and return in a week's time for repeat skin testing.  Obviously he will only be charged for the test that we do next week.  We will also refer to ENT.     #2.  Multiple sclerosis.  Currently stable.  On trial of Tysabri.  It is unclear whether Tysabri would interfere with immunotherapy but he is really not interested in immunotherapy anyway.      RECOMMENDATIONS:   No diagnosis found.  There are no Patient Instructions on file for this visit.

## 2020-01-12 ENCOUNTER — Other Ambulatory Visit: Payer: Self-pay | Admitting: Allergy/Immunology/Rheumatology

## 2020-01-12 ENCOUNTER — Telehealth: Payer: Self-pay

## 2020-01-12 DIAGNOSIS — J309 Allergic rhinitis, unspecified: Secondary | ICD-10-CM

## 2020-01-12 MED ORDER — HYDROCORTISONE ACETATE 1 % EX CREAM (MULTIPLE PATIENTS) *I*
1.0000 | TOPICAL_CREAM | Freq: Once | CUTANEOUS | Status: AC
Start: 2020-01-12 — End: 2020-01-13

## 2020-01-12 NOTE — Telephone Encounter (Signed)
I called [name] and he is still interested and comfortable coming in for treatment.  I completed the COVID-19 screen, patient confirmed that they have not had any exposure and does not have any symptoms.  I explained that we are taking extra precautions and using some social distancing practices and patient knows to call if they start to develop symptoms or learns that they have come in contact with a confirmed case.  Spoke with patient, answered the following questions:   Have you recently had a Covid vaccine or are you planning on getting one soon? Yes, received both doses.   Do you feel like you could have an infection or are you taking antibiotics for an infection? No    No insurance changes.

## 2020-01-12 NOTE — Telephone Encounter (Signed)
I sent patient a MyChart message regarding meds to avoid for skin test appt on Wed. 01/17/2020.    Corrin Parker, RN

## 2020-01-12 NOTE — Telephone Encounter (Signed)
Pt scheduled for skin testing appt next Wed 01/17/2020.  Sent message to provider asking to place orders   for the same.      Corrin Parker, RN

## 2020-01-15 ENCOUNTER — Ambulatory Visit: Payer: Medicare (Managed Care) | Attending: Adult Health

## 2020-01-15 VITALS — BP 118/76 | HR 55 | Temp 97.2°F | Resp 16

## 2020-01-15 DIAGNOSIS — G35 Multiple sclerosis: Secondary | ICD-10-CM

## 2020-01-15 LAB — HEPATIC FUNCTION PANEL
ALT: 19 U/L (ref 0–50)
AST: 25 U/L (ref 0–50)
Albumin: 4.5 g/dL (ref 3.5–5.2)
Alk Phos: 71 U/L (ref 40–130)
Bilirubin,Direct: 0.2 mg/dL (ref 0.0–0.3)
Bilirubin,Total: 0.8 mg/dL (ref 0.0–1.2)
Total Protein: 6.6 g/dL (ref 6.3–7.7)

## 2020-01-15 MED ORDER — SODIUM CHLORIDE 0.9 % IV SOLN WRAPPED *I*
300.0000 mg | Freq: Once | INTRAVENOUS | Status: AC
Start: 2020-01-15 — End: 2020-01-15
  Administered 2020-01-15: 300 mg via INTRAVENOUS
  Filled 2020-01-15: qty 15

## 2020-01-15 NOTE — Patient Instructions (Signed)
You must follow up with a provider 3 months after your first dose, and every six months thereafter.     MRIs are performed yearly unless you are JCV+ and >24 infusions, then MRIs are every 3 months.    If follow up visits and MRI apppointments are not up to date, we may be required to hold treatment.  This will be decided by your Neurologist.    We will draw your JCV and liver function labs every 3 months.    Prior to each infusion you will be asked about any new or worsening symptoms related to your Multiple Sclerosis.  This includes things such as sudden changes in thinking, eyesight, balance, or strength, etc.  Please review pages 6-7 of your Patient Medication Guide.    You should not get pregnant while on Tysabri.  Women of child bearing age will need to know the date of their last menstrual period.  If you cannot recall the date, we will perform a urine pregnancy test.    Please do not hesitate to call the patient line, [phone] with any questions.      If you have an insurance changes, please notify the office at [phone] as benefits must be verified.    COVID-19 Prevention  We are reminding patients to continue to do the following in order to stay healthy (per CDC guidelines):      Wash your hands often with soap and water for at least 20 seconds.  Avoid touching your eyes, nose, and mouth.  Stay hydrated and get plenty of rest.  Avoid close contact with people who are sick.  When you are sick, stay home.  Cover your cough or sneeze with a tissue and throw the tissue in the trash.  Stay home as much as possible to reduce your risk of being exposed.  If you do go out in public, stay at least six feet away from others who are sick, limit close contact and wash your hands often.  Avoid cruise ships and non-essential travel such as long plane rides.  Get your flu shot if you have not already.  It's not too late!!    Work  There is no absolute contraindication to going to work.  If you feel uncomfortable going to  work, we will support your decision to either work from home, or remain out.   We will be happy to write a letter or complete paperwork indicating the same.    What should I do if I get COVID-19?  With any virus you should rest, stay hydrated, and treat your symptoms with over the counter medications.  Call your primary care doctor for further instructions.    [facility] COVID-19 Patient Hotline (888)-928-0011  [place] Department of Public Health COVID-19 Hotline (585) 753-5555, internet: https://www2.[place].gov/health-coronavirus or email:  COVID19@[place].gov

## 2020-01-15 NOTE — Progress Notes (Signed)
Patient arrived for Tysabri infusion # 4.  Started @ 1335  and completed @ 1435 .  Pt tolerated infusion well, and was discharged home stable after observation.

## 2020-01-16 ENCOUNTER — Ambulatory Visit: Payer: Medicare (Managed Care) | Admitting: Orthopedic Surgery

## 2020-01-17 ENCOUNTER — Ambulatory Visit: Payer: Medicare (Managed Care)

## 2020-01-17 ENCOUNTER — Encounter: Payer: Self-pay | Admitting: Allergy/Immunology/Rheumatology

## 2020-01-17 DIAGNOSIS — Z9109 Other allergy status, other than to drugs and biological substances: Secondary | ICD-10-CM

## 2020-01-17 DIAGNOSIS — J45909 Unspecified asthma, uncomplicated: Secondary | ICD-10-CM

## 2020-01-17 NOTE — Progress Notes (Signed)
Patient tolerated environmental skin tests, prick tests only, without difficulty,   Hydrocortisone was applied to test sites, after reading.

## 2020-01-21 NOTE — Progress Notes (Signed)
Multiple Sclerosis Clinic Follow-up Visit  Video visit  Pt at home  Provider at home office  No other participants    Subjective:  [name] is a 69 y.o. M here for a regular follow-up of relapsing remitting MS. Since his last clinic visit he     He was able to walk 9 holes playing golf. He did not fall when hitting the golf ball, but became imbalanced. Balance remains an issue. He needs to concentrate when walking. Has a walking stick, has been helpful in the past.     Very few leg cramps since knee replacement fall 2020.  Ampyra use is ongoing.    +Hand/foot tingling unchanged.  Urologist follow-up ongoing.    Tysabri started 1/21, well tolerated other than fatigue the day of the infusion.    Got 2 doses of Moderna COVID vaccines.    He deals with a cough Feb/March, thought to not be related to allergies.    MS History:  Clinical presentations:   1990's R arm weakness   Later numbness hands and feet and stereotyped abnormal distal R thigh sensation  MRIs:   4/08 brain-relatively mild lesion burden, 1 enhancing lesion at the time   5/13 brain-no new lesions   11/14 brain-unchanged   10/16 brain-unchanged   4/18 brain-unchanged                 Thoracic-few, non-enhancing intramedullary lesions   12/20 brain-unchanged                  Thoracic cord- at least 3 new intramedullary lesions (no contrast given)   6/21 brain and thoracic cord-scheduled  Other testing:   12/20 JC index 0.33, indeterminate to negative  Disease modifying treatment hx:   Betaseron started in 1990's, stopped due to injection site reactions/infections   Tecfidera started 5/13, stopped due to new thoracic cord lesions   1/21 Tysabri started  Symptomatic treatment hx:   ED-Cialis helps   Foot cramps-baclofen can be helpful   Stereotyped R thigh abnormal sensation-less bothersome, not certain if Tegretol helped   Knee pain-acupuncture    Past Medical History:    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 Never smoker.       Meds:  Current Outpatient Medications on File Prior to Visit   Medication Sig Dispense Refill    cetirizine (ZYRTEC ALLERGY) 10 MG tablet Take 1 tablet by mouth daily      fluticasone (FLONASE) 50 MCG/ACT nasal spray Spray 1 spray into nostril daily      budesonide-formoterol (SYMBICORT) 80-4.5 MCG/ACT inhaler 1 or 2 puffs twice a day 1 each 5    natalizumab (TYSABRI) 300 MG/15ML injection  Administer 15 mLs (300 mg total) into the vein once for 1 dose [facility] to [facility] on 1st floor.  Site ID [id number] 15 mL 5    tadalafil (CIALIS) 5 MG tablet TAKE 1 TABLET BY MOUTH EVERY DAY 90 tablet 1    dalfampridine (AMPYRA) 10 MG tablet Take 1 tablet (10 mg total) by mouth 2 times daily 60 tablet 5    diazePAM (VALIUM) 5 MG tablet May take one tablet 60 minutes before MRI, take second tablet 30 minutes before MRI and take third tablet just before MRI, as needed. 3 tablet 0    cholecalciferol (VITAMIN D) 1000 UNIT tablet Take 1,000 Units by mouth daily      Cyanocobalamin (VITAMIN B-12 CR PO) Take 1,000 mcg by mouth daily          No current facility-administered medications on file prior to visit.        ROS:  10+ROS reviewed. Pertinent positives and negatives in HPI.     Vitals  There were no vitals taken for this visit.    PE:  Gen: Well appearing, NAD.  MS: Alert. Fluent. Affect appropriate for situation.  CN:    Choppy pursuits. Full facial strength. No dysarthria.  Motor: No pronator drift.No tic like movements noted. Difficult to stand on L heel. Hard to rise from squat.  Coord: No ataxia on FTN. FInger taps normal.  Gait: Wide-based casual gait, imbalanced.    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 Will continue as DMT.  PT for balance training referral and ongoing exercise in a safe manner.  MRI brain and thoracic cord 6/21, wide bore scanner. Can prescribe Valium 5mg  x 4 pills to help with prominent claustrophobia, if he has a driver.  Discussed Foot Up braces to be used PRN.    FUA 6 months.      Jered Heiny, MD  Neuroimmunology    The plan was discussed with the patient and the patient/patient rep demonstrated understanding to the provider's satisfaction.    Consent was previously obtained from the patient to complete this video consult; including the potential for financial liability.    30 minutes spent with patient (>50% spent in counseling and coordination of care), in review of the chart and completion of the encounter on the same day as the visit.

## 2020-01-22 ENCOUNTER — Ambulatory Visit: Payer: Medicare (Managed Care) | Attending: Neurology | Admitting: Neurology

## 2020-01-22 ENCOUNTER — Encounter: Payer: Self-pay | Admitting: Neurology

## 2020-01-22 DIAGNOSIS — R252 Cramp and spasm: Secondary | ICD-10-CM | POA: Insufficient documentation

## 2020-01-22 DIAGNOSIS — G35 Multiple sclerosis: Secondary | ICD-10-CM

## 2020-01-22 DIAGNOSIS — R269 Unspecified abnormalities of gait and mobility: Secondary | ICD-10-CM | POA: Insufficient documentation

## 2020-01-22 DIAGNOSIS — F419 Anxiety disorder, unspecified: Secondary | ICD-10-CM | POA: Insufficient documentation

## 2020-01-22 MED ORDER — DIAZEPAM 5 MG PO TABS *I*
ORAL_TABLET | ORAL | 0 refills | Status: DC
Start: 2020-01-22 — End: 2020-09-16

## 2020-01-23 ENCOUNTER — Encounter: Payer: Self-pay | Admitting: Orthopedic Surgery

## 2020-01-23 ENCOUNTER — Ambulatory Visit: Payer: Medicare (Managed Care) | Admitting: Orthopedic Surgery

## 2020-01-23 ENCOUNTER — Telehealth: Payer: Self-pay | Admitting: Neurology

## 2020-01-23 VITALS — BP 102/66 | Ht 71.0 in | Wt 166.0 lb

## 2020-01-23 DIAGNOSIS — Z96651 Presence of right artificial knee joint: Secondary | ICD-10-CM

## 2020-01-23 NOTE — Telephone Encounter (Addendum)
Return in about 6 months (around 07/23/2020) with Dr Ardelia Mems.    Left message to call access center to schedule.

## 2020-01-23 NOTE — Progress Notes (Signed)
Subjective: [name] is here 6 months s/p right TKA. Overall the patient is doing well, he reports stiffness and aching consistent with TKA in setting of multiple sclerosis    ROS: No chest pain, SOB, fever, chills, skin erythema, all remaining systems negative.    Objective:  Constitutional: NAD, comfortable  Psychiatric: Normal affect  Musculoskeletal:  Soft tissue: Well healed surgical incision   Palpation: Calor: Mild  Gait: unassisted   ROM: 0-120  Stability: knee stable in the coronal and sagittal plane at 30 & 90 degrees of flexion  Neurocirculatory: compartments soft, foot warm and perfused     Radiographs: None    Impression:  1. S/P Right TKA - 6 months    Plan:  Doing well overall, discussed expectations of TKA, follow up 6 months.     Answers for HPI/ROS submitted by the patient on 01/19/2020  What is your goal for today's visit?: REVIEW PAST KNEE REPLACEMENT  Date of onset: : 07/31/2019  Was this the result of an injury?: No  What is your pain level?: 1/10  Please describe the quality of your pain: : aching  What diagnostic workup have you had for this condition?: MRI scan  Progression since onset: : gradually improving  Is this a work related condition? : No  Current work status: : usual activities  Fever: No  Chills: No  Numbness: Yes  Tingling: Yes

## 2020-01-25 ENCOUNTER — Encounter: Payer: Self-pay | Admitting: Neurology

## 2020-01-29 LAB — STRATIFY JCV AB INHIBITION ASSAY ASSAY: Stratify JCV inhibition: NEGATIVE

## 2020-01-29 LAB — STRATIFY JCV W/INDEX W/REFLEX TO INHIBITION
STJIX - Index Value: 0.28
Stratify JCV AB with reflex to inhibition: UNDETERMINED

## 2020-02-05 NOTE — Progress Notes (Signed)
Pre-Visit Planning    Health Maintenance Due   Topic Date Due    HIV Screening USPSTF/  Never done    COVID-19 Vaccine (2 - Moderna 2-dose series) 12/01/2019       Notes:    Completed on 02/05/20 by Rita Ohara

## 2020-02-06 ENCOUNTER — Ambulatory Visit: Payer: Medicare (Managed Care) | Admitting: Primary Care

## 2020-02-06 ENCOUNTER — Encounter: Payer: Self-pay | Admitting: Primary Care

## 2020-02-06 VITALS — BP 112/64 | HR 72 | Temp 97.6°F | Ht 70.75 in | Wt 166.0 lb

## 2020-02-06 DIAGNOSIS — J4520 Mild intermittent asthma, uncomplicated: Secondary | ICD-10-CM

## 2020-02-06 DIAGNOSIS — Z0000 Encounter for general adult medical examination without abnormal findings: Secondary | ICD-10-CM

## 2020-02-06 DIAGNOSIS — E785 Hyperlipidemia, unspecified: Secondary | ICD-10-CM

## 2020-02-06 LAB — PCMH FALL RISK PLAN

## 2020-02-06 LAB — PCMH DEPRESSION ASSESSMENT

## 2020-02-06 LAB — PCMH FALL RISK ASSESSMENT

## 2020-02-06 MED ORDER — HYDROCORTISONE 2.5 % EX OINT *I*
TOPICAL_OINTMENT | Freq: Two times a day (BID) | CUTANEOUS | 0 refills | Status: DC | PRN
Start: 2020-02-06 — End: 2020-07-26

## 2020-02-06 NOTE — Patient Instructions (Addendum)
Cancer screening -- JUL 2026 is next colon screening unless you note change in colon health.  Annual PSA still recommended for now.  Mole check?    Blood pressure -- be sure that average systolic BP remains below 130 mmHg.  Limit dietary salt.    Cholesterol -- lean proteins, plenty of plant-based fiber; goal for non-HDL cholesterol should be 073 mg/dL or less.  There is another class of cholesterol-lowering medication if you are unable to achieve behaviorally.    Sugar metabolism -- remains effective for now.  Treat that metabolism well by keeping carbs "high quality", whole-grain, portion-controlled, less processed.    Immunizations -- tetanus booster due in NOV 2021.  Flu vaccine promptly after Labor Day.  COVID variants???    Prostate -- flow good enough for now.  Cialis perhaps somewhat helpful.     MSK -- stretches, toning, movement, and judicious NSAID use.j    Fasting blood test in AUG will update cholesterol, sugar, prostate    Please forward copy of proxy.    Get 7+ hours of restful down time in every 24.    Lips -- Vaseline, topical steroid, avoidance of any contact irritants, flavors, numbing agents.      Thank you for completing your Annual Exam and Subsequent Annual Medicare Visit   with [place] today.     The purpose of this visits was to:     Screen for disease   Assess risk of future medical problems   Help develop a healthy lifestyle   Update vaccines    Patient Care Team:  Ruby Cola, MD as PCP - General  Gaetana Michaelis, MD as Provider Team (Neurology)  Lennette Bihari Jillene Bucks, MD as Provider Team (Cardiology)  Eli Hose, MD as Provider Team (Ophthalmology)  Courtney Paris, MD as Provider Team (Dermatology)  Myrle Sheng, MD as Provider Team (Urology)  Sophronia Simas, MD as Provider Team (Allergy/Immunology/Rheumatology)     Medicare 5 Year Plan    The following items were identified as areas of concern during your screening today:  None identified - This is great news. You have  avoided Smoking, Diabetes, High Blood Pressure (Hypertension) and a high BMI (you are not overweight).       The Health Maintenance table below identifies screening tests and immunizations recommended by your health care team:  Health Maintenance: These screening recommendations are based on USPSTF, [employer], and [place] state guidelines   Topic Date Due    HIV Screening  01/24/2039 (Originally 02/27/1964)    Cholesterol Screening  05/18/2020    DEPRESSION SCREEN YEARLY  02/05/2021    Fall Risk Screening  02/05/2021    Colon Cancer Screening  04/29/2025    Flu Shot  Completed    Adult Prevnar Vaccination  Completed    Adult Pneumovax Vaccine  Completed    Shingles Vaccine  Completed    Hepatitis C Screening  Completed    COVID-19 Vaccine  Completed     In addition, goals and orders placed to address these recommendations are listed in the "Today's Visit" section.    We wish you the best of health and look forward to seeing you again next year for your Annual Medicare Wellness Visit.     If you have any health care concerns before then, please do not hesitate to contact us.

## 2020-02-06 NOTE — Progress Notes (Signed)
Visit performed as:             Office Visit, met with patient in person    Today we reviewed and updated [name] smoking status, activities of daily living, depression screen, fall risk, medications and allergies.   I have counseled the patient in the above areas.     Subjective:     Chief Complaint: [name] is a 69 y.o. male here for a/an Annual Exam and Subsequent Annual Medicare Visit    In general, [name] rates their overall health as:  good with exception of balance challenges of late.      Patient Care Team:  Ivory Broad, MD as PCP - General  Morton Peters, MD as Provider Team (Neurology)  Hart Carwin Lajoyce Corners, MD as Provider Team (Cardiology)  Virgel Gess, MD as Provider Team (Ophthalmology)  Moss Mc, MD as Provider Team (Dermatology)  Kathreen Cosier, MD as Provider Team (Urology)  [name], MD as Provider Team (Allergy/Immunology/Rheumatology)     Current Outpatient Medications on File Prior to Visit   Medication Sig Dispense Refill    natalizumab (TYSABRI) 300 MG/15ML injection Administer 15 mLs (300 mg total) into the vein once for 1 dose [facility] on 1st floor.  Site ID [id number] 15 mL 5    tadalafil (CIALIS) 5 MG tablet TAKE 1 TABLET BY MOUTH EVERY DAY 90 tablet 1    dalfampridine (AMPYRA) 10 MG tablet Take 1 tablet (10 mg total) by mouth 2 times daily 60 tablet 5    cholecalciferol (VITAMIN D) 1000 UNIT tablet Take 1,000 Units by mouth daily      Cyanocobalamin (VITAMIN B-12 CR PO) Take 1,000 mcg by mouth daily         diazePAM (VALIUM) 5 MG tablet Take 1 tab PO PRN anxiety 60 minutes before MRI. May repeat as needed x3. Must have a driver that day. 4 tablet 0     No current facility-administered medications on file prior to visit.     Allergies   Allergen Reactions    Environmental Allergies Other (See Comments)     Congestion; itching; watery eyes    Environmental [Mold] Other (See Comments)     coughing     Patient  Active Problem List    Diagnosis Date Noted    Multiple sclerosis-Re-enrolled MSPATHS 001, 01/15/20 05/30/2008     Priority: High     Relapsing; remitting, doing well on oral Tecfidera.  Taking coenzyme Q10 for muscle irritability, seems better.        Mild reactive airways disease 11/28/2012     Priority: Medium     See 2021 allergy testing, all negative!      Thoracic aortic aneurysm (TAA) 02/22/2009     Priority: Medium     mild- 4.0 cm  by CT scan 5/10  See 6/13 echo at [facility] describing no change, plan for 24 month f/u  See [id number] [facility]A note.  See 04/06/16 echo ([facility]) mild.        Hyperlipidemia 05/30/2008     Priority: Medium     Normal TG, just normal HDL, and LDL as high as 150's at times.  No premature CVD in family, mother with CAD late.  Started statin in 2014, supplementing with coenzyme Q10 to minimize muscle side effects.      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     HLD (hyperlipidemia)     Hyperlipidemia 05/30/2008    Normal TG, just normal HDL, and LDL as high as 150's at times. No premature CVD in family, mother with CAD late. Started statin in 2014, supplementing with coenzyme Q10 to minimize muscle side effects.     Incomplete Right Bundle Branch Block 05/30/2008          Multiple sclerosis      Osteoarthritis of right knee 02/01/2014    Psoriasis     Thoracic aortic aneurysm (TAA) 02/22/2009    mild- 4.0 cm  by CT scan 5/10 See 6/13 echo at [facility] describing no change, plan for 24 month f/u See [id number] UCVA note. See 04/06/16 echo ([facility]) mild.     Tic disorder      Past Surgical History:   Procedure Laterality Date    COLONOSCOPY      HERNIA REPAIR      HX TONSILLECTOMY/ADENOIDECTOMY      INCISIONAL HERNIA REPAIR      KNEE ARTHROSCOPY Right     OTHER SURGICAL HISTORY      MS    PR TOTAL KNEE ARTHROPLASTY Right 07/31/2019    Procedure: RIGHT TOTAL KNEE ARTHROPLASTY;  Surgeon: Arley Phenix, MD;  Location: [facility];  Service: Orthopedics    VASECTOMY       Family History   Problem Relation Age of Onset    Heart Disease Mother         coronary art disease, not premature    Cancer Father         multiple myeloma    Cancer Maternal Grandmother         unknown    Heart Disease Brother         aneurism-07-09-18    Multiple Sclerosis Neg Hx     Lupus Neg Hx     Rheum arthritis Neg Hx     Thyroid disease Neg Hx     Diabetes Neg Hx     Colon cancer Neg Hx     Colon polyps Neg Hx     High Blood Pressure Neg Hx      Social History     Socioeconomic History    Marital status: Married     Spouse name: Not on file    Number of children: Not on file    Years of education: Not on file    Highest education level: Not on file   Occupational History    Not on file   Tobacco Use    Smoking status: Never Smoker    Smokeless tobacco: Never Used   Substance and Sexual Activity    Alcohol use: Never     Comment: Very occasionaly    Drug use: Yes  Frequency: 2.0 times per week     Types: Marijuana    Sexual activity: Never   Social History Programmer, 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        Exercise -- doubles tennis or pickle ball up to 5 times weekly, joined [employer] and plans to add some resistance machines.        Diet -- varied, more lean proteins, complex carbs, not  a lot of dessert, some greens.  Protein shakes at bkfst and lunch, flax, seeds, yogurt.        Sleep -- active dreams, variable sleep success.  No GU interruption.        Safety -- seatbelt and no distractions, smoke detectors, sunglasses, plans to be more consistent with skin block.       Objective:     Vital Signs: BP 112/64    Pulse 72    Temp 36.4 C (97.6 F) (Temporal)    Ht 1.797 m (5' 10.75")    Wt 75.3 kg (166 lb)    BMI 23.32 kg/m    BMI: Body mass index is 23.32 kg/m.    Vision Screening Results (Welcome visit only):  No exam data present    Depression Screening Results:  Recent Review Flowsheet Data     PHQ Scores 02/06/2020 07/03/2019 05/17/2019 11/25/2018 11/25/2017 03/16/2017 11/05/2016    PSQ2 Q1 - Interest/Pleasure N N N N N N N    PSQ2 Q2 - Down, Depressed, Hopeless N N N N N N N        Opioid Use/DAST- 10 Screening Results:   No data recorded  Activities of Daily Living/Functional Screening Results:  Is the person deaf or does he/she have serious difficulty hearing?: N  *Hearing Status: No impairment  Is this person blind or does he/she have serious difficulty seeing even when wearing glasses?: N  *Vision Status: Visual aid   Does this person have serious difficulty walking or climbing stairs?: Y  *Walks in Home: Independent  *Climbing Stairs: Independent  Does this person have difficulty dressing or bathing?: N  *Shopping: Independent  *House Keeping: Independent  *Managing Own Medications: Independent  *Handling Finances: Independent  Difficulty doing errands due to a physicial, mental or emotional condition: No  Difficulty remembering or making decisions due to a physicial, mental or emotional condition: No      Fall Risk Screening Results:  Have you fallen in the last year?: Yes  Did you sustain an injury which required medical attention?: No  Do you feel you are at risk for falling?: Yes  Do you feel your risk of falling is: : Low  Would you like to learn more about how to reduce your risk of  falling?: No      Assessment and Plan:     Cognitive Function:  Recall of recent and remote events appears:  Normal      Advanced Care Planning:  to be forwarded.     The following health maintenance plan was reviewed with the patient:    Health Maintenance Topics with due status: Postponed       Topic Postponed Until    HIV Screening USPSTF/[place] 01/24/2039 (Originally 02/27/1964)     Health Maintenance Topics with due status: Not Due       Topic Last Completion Date    Colon Cancer Screening USPSTF 04/30/2015    LIPID DISORDER SCREENING 05/19/2019    DEPRESSION SCREEN YEARLY 02/06/2020  Fall Risk Screening 02/06/2020     Health Maintenance Topics with due status: Completed       Topic Last Completion Date    Hepatitis C Screening 12/15/2013    IMM-PREVNAR VACCINE 65 + YRS 02/28/2015    IMM-PNEUMOVAX VACCINE 65 + YRS 11/05/2016    IMM-ZOSTER 05/07/2017    IMM-INFLUENZA 07/03/2019    COVID-19 Vaccine 11/24/2019     This health maintenance schedule, identified risks, a list of orders placed today and patient goals have been provided to [name] in the after visit summary.     Plan for any concerns identified during screening or risk assessments:  See Patient Instructions.

## 2020-02-08 ENCOUNTER — Encounter: Payer: Self-pay | Admitting: Primary Care

## 2020-02-08 ENCOUNTER — Ambulatory Visit: Payer: Medicare (Managed Care) | Attending: Neurology | Admitting: Rehabilitative and Restorative Service Providers"

## 2020-02-08 DIAGNOSIS — G35 Multiple sclerosis: Secondary | ICD-10-CM

## 2020-02-08 NOTE — Progress Notes (Signed)
Physical Therapy Exercise Flowsheet:  *Please refer to Physical Therapy Daily Flowsheet for further details of this session.*     02/08/20 1500   Balance Exercises   additional exercise HEP: SLR, Sidelying Abduction, Prone Hip Ext, Bridges, 90/90 Heel Tap, Tandem Stance, Sidestepping at Wall, Clock Steps   Total time 10 min therex   Dallie Dad, PT

## 2020-02-08 NOTE — Progress Notes (Signed)
Physical Therapy Daily Flowsheet:  *Please see Physical Therapy Exercise Flowsheet for details regarding exercises completed this session.*     02/08/20 1400   Overview   Diagnosis Multiple Sclerosis   Insurance Medicare Blue Choice   Script Date 01/22/20   Visit # 1   Additional Comments 12 week POC   Pain Assessment   Pain Denies   Functional Outcome Measures   Dynamic Gait Index Yes   Gait on level surface 2   Change in Gait Speed 3   Gait with horizontal head turns 1   Gait with  Vertical Head Turns 1   Gait and Pivot Turn 2   Stepping Over Obstacle 2   Stepping Around Obstacle 2   Steps 2   Dynamic Gait Index Score 15   Gait Speed Yes   Distance(meters) 10   Time(sec) 8.43   Meters/Sec 1.19   Additional comments: no AD   Patient Education   Patient Education Yes   Educated in disease process Yes   Educated in home exercise program Yes   Additional Patient Education HEP: SLR, Sidelying Abduction, Prone Hip Ext, Bridges, 90/90 Heel Tap, Tandem Stance, Sidestepping at [employer], Clock Steps   Time Calculation   PT Timed Codes 10   PT Untimed Codes 35   PT Unbilled Time 0   PT Total Treatment 45   Dallie Dad, PT

## 2020-02-08 NOTE — Progress Notes (Addendum)
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     HLD (hyperlipidemia)     Hyperlipidemia 05/30/2008    Normal TG, just normal HDL, and LDL as high as 150's at times. No premature CVD in family, mother with CAD late. Started statin in 2014, supplementing with coenzyme Q10 to minimize muscle side effects.     Incomplete Right Bundle Branch Block 05/30/2008          Multiple sclerosis     Osteoarthritis of right knee 02/01/2014    Psoriasis     Thoracic aortic aneurysm (TAA) 02/22/2009    mild- 4.0 cm  by CT scan 5/10 See 6/13 echo at [facility] describing no change, plan for 24 month f/u See [id number] UCVA note. See 04/06/16 echo ([facility]) mild.     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 He had been playing tennis and pickle ball quite well but now does not trust his balance. He reports that this has  gotten worse over the past couple of months.  Referring practitioner: Morton Peters, MD  Onset Date Sudie Grumbling of Surgery: 25 years ago  Previous Treatments: PT (2016)  Falls: Fell once, tripped over golf clubs so does not attribute it to his imbalance    Type of home: 2 Story  Entrance to home: 1 stairs with No rail(s)  Stairs inside of home: 12 stairs with 1  rail(s)  Bedroom location: 2nd floor  Bathroom location: full bath on 2nd floor  Lives: with spouse  Medical equipment in the home: straight cane and walking sticks  Orthotics: Looking into getting one (bilateral)    SUBJECTIVE  Pain: Right knee (2/10)  Current Functional Limitations:Hard   Patient Goals for Therapy: Return to pickle ball if possible, improve balance    OBJECTIVE  Observation: Patient is a pleasant and cooperative male in NAD.  Cognition: no deficit noted  Vision: functional with corrective lenses - saw just recently  Sensation: right lower extremity:  light touch - diminished below the level of the ankle  left lower extremity:  light touch - intact and diminished below the level of the ankle  Coordination: right lower extremity:  intact  left lower extremity:  intact  Tone: right lower extremity:  within normal limits  left lower extremity:  within normal limits      ROM:     R UE: AROM within functional limits  L UE: AROM within functional limits  R LE: AROM within functional limits except impaired: knee flexion  L LE: AROM within functional limits    *Indicates pain    Strength:     Muscle group Right  Left    Shoulder Flexion     Elbow Flexion     Elbow Extension     Wrist Flexion     Wrist Extension     Hip Flexors   4 4   Hip Extensors     Hip Abductors 5 5   Hip Adductors     Knee Extensors  4 4   Knee Flexors 4 4   Ankle Dorsiflexors 4- 4-   Ankle Plantar flexors  4- 4-   Ankle Inverters      Ankle Evertors     Extensor hallucis longus          Functional Assessment:     Bed mobility: NT    Transfers: Able to perform sit to stand transfers  independently using arm rests     Ambulation: able to walk community distances independently using no device . Gait deviations: decreased right step length , decreased left step length, unsteady and decreased dorsiflexion bilaterally    Stairs: Independent to navigate 4 stairs with 2 rail(s) with no device      Balance:   Test  Eyes Open  Eyes Closed    Normal 10 seconds, Good     Romberg  10 seconds, Fair     Sharpened Romberg 10 seconds, Poor     SLS  NT       Excellent = Minimal Sway  Good = Mild Sway  Fair = Moderate Sway  Poor = Severe Sway / Loss of Balance    Functional Performance Outcome Measures:     Dynamic Gait Index Score Interpretation: 15 - 02/08/2020  (Adapted from [facility] Motor Control: Theory and Artist, 1995)   Scores greater than 17 points: indicate no fall risk.   Scores less than 17 points: indicate a fall risk.    Gait speed: 10 walk test: 1.19 m/s      10 meter Gait speed Cut-off scores   <0.63m/s Likely household ambulator1   0.4-0.34m/s Limited community ambulator (*<0.07 increased risk of falls, hospitalization, and mortality)1   >0.49m/s Community ambulator (1.2-1.47m/s to safely cross street)1   <0.17m/s Indicative of frailty2   <1.22m/s Identifies well-functioning people at high risk of adverse health-related outcomes3   1. Diannia Ruder, Novella Olive., Behrman, Cherly Beach., & Allene Pyo. A. (2008). Validation of a speed-based classification system using quantitative measures of walking performance poststroke. Neurorehabilitation and neural repair, 22(6), [id number]. http://fisher.org/  2. Shelda Jakes, L., & Young, J. (2014). Diagnostic test accuracy of simple instruments for identifying frailty in community-dwelling older people: a systematic review. Age And Ageing, 44(1), 148-152. http://white.info/  3. [place] Donovan, S., Penninx, B., Nicklas, B., Simonsick, E., & Ezzard Standing, A. et al. (2005). Prognostic  Value of Usual Gait Speed in Well-Functioning Older PeopleResults from the Health, Aging and Body Composition Study. Journal Of The [employer] and [employer], 53(10), [phone]. https://www.west-esparza.com/      MCID for Geriatrics: 0.1-0.34m/s1, 2    1. Emmit Pomfret., &  Studenski, S. (2006). Meaningful Change and Responsiveness in Common Physical Performance Measures in Older Adults. Journal Of The [employer] and [employer], 54(5), [phone]. http://www.nguyen-hutchinson.com/  2. Bohannon, R., & Glenney, S. (2014). Minimal clinically important difference for change in comfortable gait speed of adults with pathology: a systematic review. Journal Of Evaluation In Clinical Practice, 20(4), 295-300. http://fox-wallace.com/.15615      Patient Education:     Patient Education  Patient Education: Yes  Educated in disease process: Yes  Educated in home exercise program: Yes  Additional Patient Education: HEP: SLR, Sidelying Abduction, Prone Hip Ext, Bridges, 90/90 Heel Tap, Tandem Stance, Sidestepping at [employer], Clock Steps    ASSESSMENT  [name] is a 69 y.o. male with a history of Diagnosis: Multiple Sclerosis. He currently presents to outpatient physical therapy with deficits in sensation, strength and balance resulting in difficulty in transfers, ambulation, stair navigation and ADLs. Skilled physical therapy services indicated to maximize independence with functional mobility and address goals below.     The following comorbidities may affect treatment/recovery: Cardiac history  Multiple sclerosis (MS)  Total knee replacement and the following Personal factors may affect treatment/recovery: Advanced age.       Clinical presentation:evolving    Patient complexity is low level as indicated by above personal factors, environmental factors and comorbidities in addition to their impairments found on physical exam.      Rehab potential/prognosis: good  Patient's  understanding: good      Short term goals: 4 weeks  HEP/Family Training Goals:   Patient will be independent with basic home exercise routine.  Outcome Measures Goals:   DGI: Patient will achieve at least a 19/24 on the Dynamic Gait Index in order to limit fall risk.  Balance/Vestibular Goals:   Patient will be able to maintain Sharpened Romberg >= 20 seconds with eyes open without LOB indicating improved standing balance.  Functional Mobility Goals:  Patient will be assessed for drop foot / dorsiflexion assist bracing (Foot-Up)    Long Term Goals: 12 weeks  HEP/Family Training Goals:   Patient will be independent with comprehensive home exercise routine.  Outcome Measures Goals:   DGI: Patient will achieve at least a 22/24 on the Dynamic Gait Index in order to limit fall risk.  Balance/Vestibular Goals:   Patient will be able to maintain Sharpened Romberg >= 30 seconds with eyes open without LOB indicating improved standing balance.  Patient will be able to return to pickleball safely in some capacity.      PLAN  Plan of Care: Appropriate for PT  PT interventions: AROM/PROM/Therapeutic exercise, Balance activites, Closed chain activites, Flexibility, Gait training/Functional activities, General conditioning, Home exercise program instruction, Manual therapy, Neuromuscular Re-education, Patient/Family Education, Postural training/body Curator education, Proprioceptive training, Strengthening, Therapeutic Activities  PT frequency:  Once a week  PT duration: 12 weeks      Thank you for the referral.  If you have any questions and/or concerns, please feel free to contact me at [phone].    Dallie Dad, PT    Department of Physical Medicine & Rehabilitation    PLAN OF CARE    Physician:  Gaetana Michaelis, MD    I have reviewed your initial evaluation and agree with the documented goals and Plan of Care      02/08/2020

## 2020-02-09 ENCOUNTER — Telehealth: Payer: Self-pay

## 2020-02-09 NOTE — Telephone Encounter (Signed)
I called [name] and he is still interested and comfortable coming in for treatment.  I completed the COVID-19 screen, patient confirmed that they have not had any exposure and does not have any symptoms.  I explained that we are taking extra precautions and using some social distancing practices and patient knows to call if they start to develop symptoms or learns that they have come in contact with a confirmed case.  Spoke with patient, answered the following questions:   Have you recently had a Covid vaccine or are you planning on getting one soon? Yes  Do you feel like you could have an infection or are you taking antibiotics for an infection? No   No insurance changes

## 2020-02-09 NOTE — Telephone Encounter (Signed)
Are we doing that?

## 2020-02-11 NOTE — H&P (Signed)
History and Physical    HISTORY:  Chief Complaint   Patient presents with    Annual Exam    Subsequent Annual Medicare Visit         History of Present Illness:    HPI  See recent notes regarding gait instability, neurology assessment and plan.  He is no longer playing competitive tennis, although can enjoy informal rallying with his spouse.  He notes no new exertional symptoms otherwise.  Allergy testing yielded no extrinsic allergies, and he has backed off on medications for.    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     HLD (hyperlipidemia)     Hyperlipidemia 05/30/2008    Normal TG, just normal HDL, and LDL as high as 150's at times. No premature CVD in family, mother with CAD late. Started statin in 2014, supplementing with coenzyme Q10 to minimize muscle side effects.     Incomplete Right Bundle Branch Block 05/30/2008          Multiple sclerosis     Osteoarthritis of right knee 02/01/2014    Psoriasis     Thoracic aortic aneurysm (TAA) 02/22/2009    mild- 4.0 cm  by CT scan  5/10 See 6/13 echo at [facility] describing no change, plan for 24 month f/u See [id number] [facility] note. See 04/06/16 echo ([facility]) mild.     Tic disorder      Past Surgical History:   Procedure Laterality Date    COLONOSCOPY      HERNIA REPAIR      HX TONSILLECTOMY/ADENOIDECTOMY      INCISIONAL HERNIA REPAIR      KNEE ARTHROSCOPY Right     OTHER SURGICAL HISTORY      MS    PR TOTAL KNEE ARTHROPLASTY Right 07/31/2019    Procedure: RIGHT TOTAL KNEE ARTHROPLASTY;  Surgeon: Arley Phenix, MD;  Location[facility];  Service: Orthopedics    VASECTOMY           Allergies:    Allergies   Allergen Reactions    Environmental Allergies Other (See Comments)     Congestion; itching; watery eyes    Environmental [Mold] Other (See Comments)     coughing       Current medications:    Current Outpatient Medications   Medication Sig    natalizumab (TYSABRI) 300 MG/15ML injection Administer 15 mLs (300 mg total) into the vein once for 1 dose Please deliver  to [facility] pharmacy on 1st floor.  Site ID [id number]    tadalafil (CIALIS) 5 MG tablet TAKE 1 TABLET BY MOUTH EVERY DAY    dalfampridine (AMPYRA) 10 MG tablet Take 1 tablet (10 mg total) by mouth 2 times daily    cholecalciferol (VITAMIN D) 1000 UNIT tablet Take 1,000 Units by mouth daily    Cyanocobalamin (VITAMIN B-12 CR PO) Take 1,000 mcg by mouth daily       hydrocortisone 2.5 % ointment Apply topically 2 times daily as needed for Rash to the following areas: lips; no more than 2 weeks.    diazePAM (VALIUM) 5 MG tablet Take 1 tab PO PRN anxiety 60 minutes before MRI. May repeat as needed x3. Must have a driver that day.       Family History:    Family History   Problem Relation Age of Onset    Heart Disease Mother         coronary art disease, not premature    Cancer Father         multiple myeloma    Cancer Maternal Grandmother         unknown    Heart Disease Brother         aneurism-07-09-18    Multiple Sclerosis Neg Hx     Lupus Neg Hx     Rheum arthritis  Neg Hx     Thyroid disease Neg Hx     Diabetes Neg Hx     Colon cancer Neg Hx     Colon polyps Neg Hx     High Blood Pressure Neg Hx        Social/Occupational History:   Social History     Socioeconomic History    Marital status: Married     Spouse name: Not on file    Number of children: Not on file    Years of education: Not on file    Highest education level: Not on file   Tobacco Use    Smoking status: Never Smoker    Smokeless tobacco: Never Used   Substance and Sexual Activity    Alcohol use: Never     Comment: Very occasionaly    Drug use: Yes     Frequency: 2.0 times per week     Types: Marijuana    Sexual activity: Never   Other Topics Concern    Not on file   Social History Narrative    Engineer, 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 Has 2 older children.        Exercise -- doubles tennis or pickle ball up to 5 times weekly, joined [employer] and plans to add some resistance machines.        Diet -- varied, more lean proteins, complex carbs, not a lot of dessert, some greens.  Protein shakes at bkfst and lunch, flax, seeds, yogurt.        Sleep -- active dreams, variable sleep success.  No GU interruption.        Safety -- seatbelt and no distractions, smoke detectors, sunglasses, plans to be more consistent with skin block.         Review of Systems:    Review of Systems   Constitutional: Negative for diaphoresis and fever.   HENT: Negative for ear pain, hearing loss, nosebleeds and sore throat.    Eyes: Negative for double vision, photophobia and pain.   Respiratory: Negative for cough, shortness of breath and wheezing.    Cardiovascular:  Negative for chest pain, palpitations, claudication and PND.   Gastrointestinal: Negative for abdominal pain, blood in stool, melena, nausea and vomiting.        Rare heartburn, no dysphagia.   Genitourinary: Negative for dysuria, flank pain and hematuria.   Musculoskeletal:        No lingering morning stiffness, no joint swelling.   Skin: Negative for rash.        No  moles of concern.   Neurological: Negative for dizziness and focal weakness.   Endo/Heme/Allergies: Negative for polydipsia.   Psychiatric/Behavioral: Negative for depression and memory loss. The patient is not nervous/anxious and does not have insomnia.        Vital Signs:   BP 112/64    Pulse 72    Temp 36.4 C (97.6 F) (Temporal)    Ht 1.797 m (5' 10.75")    Wt 75.3 kg (166 lb)    BMI 23.32 kg/m       PHYSICAL EXAM:  Physical Exam  Vitals reviewed.   Constitutional:       General: He is not in acute distress.     Appearance: Normal appearance. He is well-developed and normal weight. He is not ill-appearing or diaphoretic.   HENT:      Head: Atraumatic.      Right Ear: Tympanic membrane and external ear normal.      Left Ear: Tympanic membrane and external ear normal.      Nose: Nose normal.   Eyes:      General: No scleral icterus.     Conjunctiva/sclera: Conjunctivae normal.      Pupils: Pupils are equal, round, and reactive to light.   Neck:      Thyroid: No thyromegaly.      Vascular: No carotid bruit or JVD.      Trachea: No tracheal deviation.   Cardiovascular:      Rate and Rhythm: Normal rate and regular rhythm.      Heart sounds: Normal heart sounds. No murmur heard.   No gallop.    Pulmonary:      Effort: Pulmonary effort is normal. No respiratory distress.      Breath sounds: Normal breath sounds. No wheezing or rales.   Abdominal:      General: Bowel sounds are normal. There is no distension.      Palpations: Abdomen is soft. There is no mass.      Tenderness: There is no abdominal tenderness. There is no right CVA tenderness or left CVA tenderness.   Musculoskeletal:         General: No tenderness or deformity. Normal range of motion.      Cervical back: Neck supple.      Right lower leg: No edema.      Left lower leg: No edema.   Lymphadenopathy:      Cervical: No cervical adenopathy.      Upper Body:      Right upper body: No supraclavicular adenopathy.      Left upper body: No supraclavicular  adenopathy.   Skin:     General: Skin is dry.      Capillary Refill: Capillary refill takes less than 2 seconds.      Findings: No erythema or rash.      Comments: No suspicious nevi.   Neurological:      Mental Status: He is alert and oriented to person, place, and time.      Cranial Nerves: No cranial nerve deficit.  Motor: No abnormal muscle tone.      Deep Tendon Reflexes: Reflexes are normal and symmetric.   Psychiatric:         Mood and Affect: Mood normal.       Component      Latest Ref Rng & Units 01/15/2020 09/26/2019 05/19/2019   WBC      4.2 - 9.1 THOU/uL  5.3 6.1   RBC      4.60 - 6.10 MIL/uL  4.7 4.9   Hemoglobin      13.7 - 17.5 g/dL  14.6 15.1   Hematocrit      40.00 - 51.00 %  42 44   MCV      79.0 - 92.0 fL  89 90   MCH      26 - 32 pg/cell  31 31   MCHC      32 - 37 g/dL  35 34   RDW      11.6 - 14.4 %  12.2 12.6   Platelets      150 - 330 THOU/uL  217 198   Seg Neut %      %  63.1 64.0   Lymphocyte %      %  22.0 22.5   Monocyte %      %  11.4 10.3   Eosinophil %      %  2.3 2.0   Basophil %      %  0.8 0.7   Neut # K/uL      1.8 - 5.4 THOU/uL  3.4 3.9   Lymph # K/uL      1.3 - 3.6 THOU/uL  1.2 (L) 1.4   Mono # K/uL      0.3 - 0.8 THOU/uL  0.6 0.6   Eos # K/uL      0.0 - 0.5 THOU/uL  0.1 0.1   Baso # K/uL      0.0 - 0.1 THOU/uL  0.0 0.0   Nucl RBC %      0.0 - 0.2 /100 WBC  0.0 0.0   Nucl RBC # K/uL      0.0 - 0.0 THOU/uL  0.0 0.0   IMM Granulocytes #      0.0 - 0.0 THOU/uL  0.0 0.0   IMM Granulocytes      %  0.4 0.5   Sodium      133 - 145 mmol/L  141 138   Potassium      3.3 - 5.1 mmol/L  4.1 4.2   Chloride      96 - 108 mmol/L  102 101   CO2      20 - 28 mmol/L  30 (H) 27   Anion Gap      7 - '16  9 10   '$ UN      6 - 20 mg/dL  16 16   Creatinine      0.67 - 1.17 mg/dL  1.02 0.99   GFR,Caucasian      *  74 77   GFR,Black      *  86 89   Glucose      60 - 99 mg/dL  100 (H) 91   Calcium      8.6 - 10.2 mg/dL  9.4 9.4   Total Protein      6.3 - 7.7 g/dL 6.6 6.8 6.3   Albumin      3.5 - 5.2 g/dL 4.5  4.6 4.4  Bilirubin,Total      0.0 - 1.2 mg/dL 0.8 0.7 0.6   AST      0 - 50 U/L 25 23 38   ALT      0 - 50 U/L 19 17 40   Alk Phos      40 - 130 U/L 71 69 59   Bilirubin,Direct      0.0 - 0.3 mg/dL 0.2     Cholesterol      mg/dL   190   Triglycerides      mg/dL   107   HDL Cholesterol      40 - 60 mg/dL   56   LDL Calculated      mg/dL   113   Non HDL Cholesterol      mg/dL   134   Chol/HDL Ratio         3.4   Magnesium      1.6 - 2.5 mg/dL   2.1   Vitamin B12      232 - 1,245 pg/mL   1,391 (H)   25-OH Vit Total      30 - 60 ng/mL   51   TSH      0.27 - 4.20 uIU/mL   3.91   PSA (eff. 01-2009)      0.00 - 4.00 ng/mL   0.84           Assessment:    [name] was seen today for annual exam and subsequent annual medicare visit.    Diagnoses and all orders for this visit:    Routine general medical examination at a health care facility  -     Lipid Panel (Reflex to Direct  LDL if Triglycerides more than 400); Future  -     Hemoglobin A1c; Future  -     PSA (eff.01-2009); Future    Mild intermittent reactive airway disease without complication    Preventative health care    Other orders  -     hydrocortisone 2.5 % ointment; Apply topically 2 times daily as needed for Rash to the following areas: lips; no more than 2 weeks.    We noted opportunities, suggestions as below.   .      Plan:        Cancer screening -- JUL 2026 is next colon screening unless you note change in colon health.  Annual PSA still recommended for now.  Mole check?    Blood pressure -- be sure that average systolic BP remains below 130 mmHg.  Limit dietary salt.    Cholesterol -- lean proteins, plenty of plant-based fiber; goal for non-HDL cholesterol should be 130 mg/dL or less.  There is another class of cholesterol-lowering medication if you are unable to achieve behaviorally.    Sugar metabolism -- remains effective for now.  Treat that metabolism well by keeping carbs "high quality", whole-grain, portion-controlled, less processed.    Immunizations --  tetanus booster due in NOV 2021.  Flu vaccine promptly after Labor Day.  COVID variants???    Prostate -- flow good enough for now.  Cialis perhaps somewhat helpful.     MSK -- stretches, toning, movement, and judicious NSAID use.j    Fasting blood test in AUG will update cholesterol, sugar, prostate    Please forward copy of proxy.    Get 7+ hours of restful down time in every 24.    Lips -- Vaseline, topical steroid, avoidance of any contact irritants, flavors, numbing  agents.

## 2020-02-12 ENCOUNTER — Ambulatory Visit: Payer: Medicare (Managed Care) | Attending: Adult Health

## 2020-02-12 VITALS — BP 136/80 | HR 62 | Temp 98.2°F | Resp 16

## 2020-02-12 DIAGNOSIS — G35 Multiple sclerosis: Secondary | ICD-10-CM | POA: Insufficient documentation

## 2020-02-12 MED ORDER — SODIUM CHLORIDE 0.9 % IV SOLN WRAPPED *I*
300.0000 mg | Freq: Once | INTRAVENOUS | Status: AC
Start: 2020-02-12 — End: 2020-02-12
  Administered 2020-02-12: 300 mg via INTRAVENOUS
  Filled 2020-02-12: qty 15

## 2020-02-12 NOTE — Progress Notes (Signed)
Patient arrived for Tysabri infusion #5 .  Tysabri started @1430  and completed @1530  .  Pt tolerated infusion well, although he complained of feeling "really tired". Pt was discharged home stable after one hour iobservation.

## 2020-02-15 ENCOUNTER — Encounter: Payer: Self-pay | Admitting: Rehabilitative and Restorative Service Providers"

## 2020-02-16 ENCOUNTER — Ambulatory Visit: Payer: Medicare (Managed Care) | Admitting: Rehabilitative and Restorative Service Providers"

## 2020-02-16 ENCOUNTER — Ambulatory Visit: Payer: Medicare (Managed Care) | Attending: Neurology | Admitting: Rehabilitative and Restorative Service Providers"

## 2020-02-16 DIAGNOSIS — G35 Multiple sclerosis: Secondary | ICD-10-CM | POA: Insufficient documentation

## 2020-02-16 NOTE — Progress Notes (Signed)
Physical Therapy Daily Flowsheet:  *Please see Physical Therapy Exercise Flowsheet for details regarding exercises completed this session.*     02/16/20 1200   Overview   Diagnosis Multiple Sclerosis   Insurance Medicare Blue Choice   Script Date 01/22/20   Visit # 2   Additional Comments S: Doing OK, no trouble with the home exercises   Pain Assessment   Pain Denies   Patient Education   Patient Education Yes   Educated in disease process Yes   Educated in home exercise program Yes   Time Calculation   PT Timed Codes 30   PT Untimed Codes 0   PT Unbilled Time 0   PT Total Treatment 30   Dallie Dad, PT

## 2020-02-16 NOTE — Progress Notes (Signed)
Physical Therapy Exercise Flowsheet:  *Please refer to Physical Therapy Daily Flowsheet for further details of this session.*     02/16/20 1200   Gym Equipment Exercises   Nustep Comments 5 min, level 5   Total time 5 min therex   Balance Exercises   additional exercise forward stepping in // bars, right first than left on the way back, 2 laps   additional exercise sidestepping in // bars, 2 laps both ways   additional exercise Today trialed OSSUR Foot-UP product on the right side. Able to fit patient with XL size and patient did note an improvement with his dorsiflexion and gait.  Discussed donning / doffing and other details of use. Would especially be useful bilaterally and when he is walking long distances.   Total time 10 min therex / 15 min neuro reed   Dallie Dad, PT

## 2020-02-22 ENCOUNTER — Ambulatory Visit: Payer: Medicare (Managed Care) | Admitting: Rehabilitative and Restorative Service Providers"

## 2020-02-22 DIAGNOSIS — G35 Multiple sclerosis: Secondary | ICD-10-CM

## 2020-02-22 NOTE — Progress Notes (Signed)
Physical Therapy Exercise Flowsheet:  *Please refer to Physical Therapy Daily Flowsheet for further details of this session.*     02/22/20 1200   Foot/Ankle Exercises   Achilles Stretch, Gastroc, Standing Comment slantboard, 5x20s   Total time 5 min therex   Balance Exercises   Bosu Forward Step-Ups Comment 10x2, b/l   Bosu Lateral Step-Ups Comment 10x2, b/l   Tandem Walking Comment 2 laps in // bars, minor UE assistance   additional exercise Bosu Lateral Step Down, 8x2, b/l   additional exercise Cone Taps, 10x2, alternating feet   additional exercise Reassessed patient's new Foot Ups. They did seem to be sitting a little high and ajdusted them to go lower down the tongue. Patient reported a improvement in dorsiflexion and observable improvement in heel strike was noticed.   Total time 25 min therex   Dallie Dad, PT

## 2020-02-22 NOTE — Progress Notes (Signed)
Physical Therapy Daily Flowsheet:  *Please see Physical Therapy Exercise Flowsheet for details regarding exercises completed this session.*     02/22/20 1000   Overview   Diagnosis Multiple Sclerosis   Insurance Medicare Blue Choice   Script Date 01/22/20   Visit # 3   Additional Comments S: I got the foot ups, but they tend to slide down after some use   Pain Assessment   Pain Denies   Patient Education   Patient Education Yes   Educated in disease process Yes   Educated in home exercise program Yes   Additional Patient Education HEP: Bosu Step Up Forward, Bosu Step Up Lateral, Bosu Step Down, Gastroc Stretch on Stairs   Time Calculation   PT Timed Codes 30   PT Untimed Codes 0   PT Unbilled Time 0   PT Total Treatment 30   Dallie Dad, PT

## 2020-03-01 ENCOUNTER — Ambulatory Visit: Payer: Medicare (Managed Care) | Admitting: Rehabilitative and Restorative Service Providers"

## 2020-03-04 ENCOUNTER — Encounter: Payer: Self-pay | Admitting: Otolaryngology

## 2020-03-04 ENCOUNTER — Ambulatory Visit: Payer: Medicare (Managed Care) | Admitting: Physician Assistant

## 2020-03-04 ENCOUNTER — Encounter: Payer: Self-pay | Admitting: Physician Assistant

## 2020-03-04 ENCOUNTER — Ambulatory Visit: Payer: Medicare (Managed Care) | Admitting: Otolaryngology

## 2020-03-04 VITALS — BP 122/77 | HR 60 | Ht 71.0 in | Wt 166.0 lb

## 2020-03-04 DIAGNOSIS — H6122 Impacted cerumen, left ear: Secondary | ICD-10-CM

## 2020-03-04 DIAGNOSIS — J342 Deviated nasal septum: Secondary | ICD-10-CM

## 2020-03-04 DIAGNOSIS — R05 Cough: Secondary | ICD-10-CM

## 2020-03-04 DIAGNOSIS — R059 Cough, unspecified: Secondary | ICD-10-CM

## 2020-03-04 DIAGNOSIS — J387 Other diseases of larynx: Secondary | ICD-10-CM

## 2020-03-04 DIAGNOSIS — J3489 Other specified disorders of nose and nasal sinuses: Secondary | ICD-10-CM

## 2020-03-04 NOTE — Progress Notes (Signed)
[facility]  New Patient    Chief Complaint: Cough    Patient ID: [name] is a 69 y.o. male, unaccompanied    HPI  [name] is seen today in consultation at request of [name], MD for primary symptoms of cough.  He has a history of RRMS, referred for evaluation of cough.  He was seen by [facility] on 01/10/2020, stating that his cough is typically only in February and March of each year. He takes Zyrtec in the morning as well as Flonase during these months, when his symptoms are worse, and he notes that his cough resolves.  He does frequently use cough drops and feels that this helps.  His inhalers (Advair and albuterol) do not typically help with his cough.  He has previously had allergy testing, stating this is about 25 years ago, which was positive for mold, but repeat testing was negative.  In the past, he did have immunotherapy.  He does state that his cough is only been present for the last 4 or so years, with no preceding event, and he did not have a cough when he was first diagnosed with allergies.  He was he was told that he does not have allergies on repeat testing, he has since discontinued all allergy medications.  He does feel that he occasionally has an irritation in his throat prior to the onset of his cough.  His cough is nonproductive.  He denies any change in his voice, dysphagia, dyspnea, or reflux symptoms.    [place]  has a past medical history of Asthma, Concentric Left Ventricular Hypertrophy (02/28/2009), Enlarged prostate with lower urinary tract symptoms (LUTS) (01/26/2017), Environmental allergies (05.25.18), Extrinsic allergic asthma (11/28/2012), HLD (hyperlipidemia), Hyperlipidemia (05/30/2008), Incomplete Right Bundle Branch Block (05/30/2008), Multiple sclerosis, Osteoarthritis of right knee (02/01/2014), Psoriasis, Thoracic aortic aneurysm (TAA) (02/22/2009), and Tic disorder.    Past Surgical History:   Procedure Laterality Date    COLONOSCOPY      HERNIA  REPAIR      HX TONSILLECTOMY/ADENOIDECTOMY      INCISIONAL HERNIA REPAIR      KNEE ARTHROSCOPY Right     OTHER SURGICAL HISTORY      MS    PR TOTAL KNEE ARTHROPLASTY Right 07/31/2019    Procedure: RIGHT TOTAL KNEE ARTHROPLASTY;  Surgeon: Arley Phenix, MD;  Location[facility];  Service: Orthopedics    VASECTOMY           Current Outpatient Medications:     hydrocortisone 2.5 % ointment, Apply topically 2 times daily as needed for Rash to the following areas: lips; no more than 2 weeks., Disp: 20 g, Rfl: 0    tadalafil (CIALIS) 5 MG tablet, TAKE 1 TABLET BY MOUTH EVERY DAY, Disp: 90 tablet, Rfl: 1    dalfampridine (AMPYRA) 10 MG tablet, Take 1 tablet (10 mg total) by mouth 2 times daily, Disp: 60 tablet, Rfl: 5    cholecalciferol (VITAMIN D) 1000 UNIT tablet, Take 1,000 Units by mouth daily, Disp: , Rfl:     Cyanocobalamin (VITAMIN B-12 CR PO), Take 1,000 mcg by mouth daily   , Disp: , Rfl:     diazePAM (VALIUM) 5 MG tablet, Take 1 tab PO PRN anxiety 60 minutes before MRI. May repeat as needed x3. Must have a driver that day., Disp: 4 tablet, Rfl: 0    natalizumab (TYSABRI) 300 MG/15ML injection, Administer 15 mLs (300 mg total) into the vein once for 1 dose  [facility] to [facility] on 1st floor. 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 Findings were negative except what is noted below and/or in history of present illness.   VHI: 0         PHYSICAL EXAM:  Vitals:    03/04/20 0912   BP: 122/77   Pulse: 60   Weight: 75.3 kg (166 lb)   Height: 1.803 m ('5\' 11"'$ )        General:   Appropriate, well nourished, in no acute distress  Respiratory:   Breathing well, no stridor, no DOE, retractions  Cardiac: Extremities well perfused, no cyanosis  Exposure:   Good mouth opening, intact dentition  Voice:   Mild roughness, no strain, good projection, no breathiness  Face:   Normal symmetric facial nerve mobility, skin without lesions  Neck:   Supple, trachea midline, nontender to palpation, no LAD  Ears:   Auricles: without cutaneous lesions, EAC with left cerumen impaction and right EAC clear, right tympanic membrane: well aerated w/o effusions bilaterally  Nose and Nasal Cavity: Ext: dry mucosa with mucus crusted at caudal septum. Normal turbinates without mucopurulence  Septum: Right deviation   Oral Cavity: Healthy appearing mucosa. Lips without lesions, intact dentition.  Hard palate without lesions  Oropharynx: Healthy appearing mucosa without ulcerated or pedunculated lesions. Normal symmetrical soft palate elevation   Larynx: See below    Pertinent Labs / Radiology: None    ASSESSMENT  1. Cough    2. Irritable larynx    3. Nasal dryness    4. Nasal septal deviation    5. Left ear impacted cerumen          PLAN  1) Videolaryngoscopy was performed today - please see the procedure note below.  2) Exam findings are reviewed with the patient at length.   3)  [name] is findings of cough related to irritable larynx, worsened by his nasal dryness.  Though he is not in the period where he has his cough, he does have the significant nasal dryness, which  could be certainly drying his upper aerodigestive tract and exacerbating  his cough (especially in the winter months when his cough is worst).  We discussed options for treating his cough during the times when he has this. When experiencing throat irritation that would lead to a cough/throat clear, sips of water should be taken to retrain oneself to swallow rather than cough/throat clear. Additionally, non-menthol cough drops (e.g. Halls Breezers) can be used to reduce throat irritation; menthol containing cough drops should be avoided as they can cause more throat irritation. We also discussed the need to increase hydration by increased water intake, use of nasal saline sprays, and humidifier at night. This will help to thin mucus in the upper aerodigestive tract, especially in light of his nasal septal deviation.  He will work on these recommendations when he has his cough, and if he does not have any improvement, he will reach out to me next year.    He does have left cerumen impaction, and he does state that he uses Q-tips.  We discussed that Q-tips put him at risk for cerumen impaction.  He would like to have this cleared today, and he will be seen by one of our APP is for this.    All of the patient's questions are answered, and he agrees with this plan of care.    Sharen Counter, [place]    VIDEOLARYNGOSCOPY    DATE:  03/04/2020  PATIENT NAME:  Cambridge Deleo  MRN: [id number]        Procedure:  Transnasal videolaryngoscopy, via left nare (right also examined)    Indications:  Cough; need to evaluate laryngeal biomechanics.              Nasal Cavity:  Very dry nasal mucosa with dried mucus adherent to caudal septum, no drainage from either OMC         Nasopharynx:  Unremarkable, no PND        Oropharynx:  Unremarkable           Hypopharynx:  Unremarkable            Laryngeal Lesions: Mild VF atrophy         Vocal Fold Motion: Intact         Laryngeal Closure: Complete         Supraglottic compression: Mild AP compression         Periarytenoid/Interarytenoid edema/erythema: None         Glottic secretions: Thick mucus secretions      The patient tolerated the examination well, without complications.

## 2020-03-04 NOTE — Progress Notes (Signed)
DEPARTMENT OF OTOLARYNGOLOGY    Today's Date: 03/04/2020     Chief Complaint   Patient presents with    Cerumen Impaction     Subjective   History:   [name] is a 69 y.o. male who presents for cerumen removal. He was seen earlier this morning with Dr. Tobias Alexander for cough and left cerumen impaction was noted. He denies hearing changes, otalgia, otorrhea, tinnitus. He uses Q-tips.     History of Present Illness:  Cerumen Impaction  Ear pain: No    Ear discharge: No    Tinnitus (ringing in ear): No         Objective     Examination:    Vitals:    03/04/20 1006   BP: 122/77   Pulse: 60   Weight: 75.3 kg (166 lb)   Height: 1.803 m (5\' 11" )       Physical Exam:    General:   healthy, alert, not in distress, no barriers to communication. Breathing is unlabored without wheezing or stridor.    Head and Face:   no craniofacial deformities.   Ears: RIGHT: Pre and post auricular region benign. External ear normal, without lesions or rash. Canal patent and dry. Tympanic membrane is translucent, intact and mobile with normal light reflexes and landmarks visible following cerumen removal.     LEFT: Pre and post auricular region benign. External ear normal, without lesions or rash. Canal with cerumen impaction deep by the TM, this was unable to removed. Tympanic membrane is not visualized.        Procedure note    Procedure: Cerumen Removal, left external auditory canal  Provider: , PA-C  Indications: Unable to visualize left tympanic membrane due to presence of cerumen  Consent: Verbal consent was obtained    Patient was placed in supine position. The operating microscope and ear speculum were used to visualize left ear canal. A small amount of cerumen and dry skin was removed from the canal with the use of a # 7 suction, dayhook, alligator forceps and mineral oil. Cerumen removal unable to be achieved due to discomfort, severe impaction. The TM was unable to be visualized.     Labs / Imaging /  Studies  There is no audiogram in the last 5 years.           Assessment     ICD-10-CM ICD-9-CM   1. Impacted cerumen of left ear  H61.22 380.4          Plan     [name] is a 69 y.o. male who presented for above mentioned diagnosis.     Exam findings reviewed with patient.    Patient had evidence of left cerumen impaction.   Complete cerumen removal was unable to be achieved due to discomfort and severe cerumen impaction. TM was not visualized.    Right ear exam was normal.    He will start Debrox, 5 drops twice daily to the left ear.   Return to ENT in 2 weeks for cerumen removal, sooner with concerns. Patient understood and agreed.     Return in about 2 weeks (around 03/18/2020).       05/18/2020, PA, 03/04/2020  [facility]

## 2020-03-05 ENCOUNTER — Telehealth: Payer: Self-pay | Admitting: Otolaryngology

## 2020-03-05 NOTE — Telephone Encounter (Signed)
LVM explaining to patient he does not need a FUV with [facility]. The encounter said 2 weeks as did the patient when he checked out. It looks like an appointment was already made for his ear cleaning that day by a nurse.     Gershon Crane replied to my message she does not need to see him and he should follow up as needed. It is still unclear why the encounter and patient said two weeks but If he calls back he doesn't need an appt to my knowledge

## 2020-03-07 ENCOUNTER — Ambulatory Visit: Payer: Medicare (Managed Care) | Admitting: Rehabilitative and Restorative Service Providers"

## 2020-03-07 DIAGNOSIS — G35 Multiple sclerosis: Secondary | ICD-10-CM

## 2020-03-07 NOTE — Progress Notes (Signed)
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     HLD (hyperlipidemia)     Hyperlipidemia 05/30/2008    Normal TG, just normal HDL, and LDL as high as 150's at times. No premature CVD in family, mother with CAD late. Started statin in 2014, supplementing with coenzyme Q10 to minimize muscle side effects.     Incomplete Right Bundle Branch Block 05/30/2008          Multiple sclerosis     Osteoarthritis of right knee 02/01/2014    Psoriasis     Thoracic aortic aneurysm (TAA) 02/22/2009    mild- 4.0 cm  by CT scan 5/10 See 6/13 echo at [facility] describing no change, plan for 24 month f/u See [id number] [facility] note. See 04/06/16 echo ([facility]) mild.     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 He had been playing tennis and pickle ball quite well but now does not trust his balance. He reports that this has  gotten worse over the past couple of months.  Referring practitioner: Morton Peters, MD  Onset Date Sudie Grumbling of Surgery: 25 years ago  Previous Treatments: PT (2016)  Falls: Fell once, tripped over golf clubs so does not attribute it to his imbalance    Type of home: 2 Story  Entrance to home: 1 stairs with No rail(s)  Stairs inside of home: 12 stairs with 1  rail(s)  Bedroom location: 2nd floor  Bathroom location: full bath on 2nd floor  Lives: with spouse  Medical equipment in the home: straight cane and walking sticks  Orthotics: Looking into getting one (bilateral)    SUBJECTIVE  Pain: Right knee (2/10)  Current Functional Limitations:Hard   Patient Goals for Therapy: Return to pickle ball if possible, improve balance    OBJECTIVE  Observation: Patient is a pleasant and cooperative male in NAD.  Cognition: no deficit noted  Vision: functional with corrective lenses - saw just recently  Sensation: right lower extremity:  light touch - diminished below the level of the ankle  left lower extremity:  light touch - intact and diminished below the level of the ankle  Coordination: right lower extremity:  intact  left lower extremity:  intact  Tone: right lower extremity:  within normal limits  left lower extremity:  within normal limits      ROM:     R UE: AROM within functional limits  L UE: AROM within functional limits  R LE: AROM within functional limits except impaired: knee flexion  L LE: AROM within functional limits    *Indicates pain    Strength:     Muscle group Right  Left    Shoulder Flexion     Elbow Flexion     Elbow Extension     Wrist Flexion     Wrist Extension     Hip Flexors   4+ 4+   Hip Extensors     Hip Abductors 5 5   Hip Adductors     Knee Extensors  4 4+   Knee Flexors 4 4+   Ankle Dorsiflexors 4+ 4+   Ankle Plantar flexors  4+ 4+   Ankle Inverters      Ankle Evertors     Extensor hallucis longus          Functional Assessment:     Bed mobility: NT    Transfers: Able to perform sit to stand  transfers independently using arm rests     Ambulation: able to walk community distances independently using no device . Gait deviations: decreased right step length , decreased left step length, unsteady and decreased dorsiflexion bilaterally    Stairs: Independent to navigate 4 stairs with 2 rail(s) with no device      Balance:   Test  Eyes Open  Eyes Closed    Normal 10 seconds, Good     Romberg  20 seconds, Good    Sharpened Romberg 10 seconds, Fair     SLS  NT       Excellent = Minimal Sway  Good = Mild Sway  Fair = Moderate Sway  Poor = Severe Sway / Loss of Balance    Functional Performance Outcome Measures:     Dynamic Gait Index Score Interpretation:  15 - 02/08/2020         19 - 03/07/2020  (Adapted from [facility] & [place] [employer]: Theory and Information systems manager, [id number])   Scores greater than 17 points: indicate no fall risk.   Scores less than 17 points: indicate a fall risk.    Gait speed: 10 walk test: 1.19 m/s      10 meter Gait speed Cut-off scores   <0.5823ms Likely household ambulator1   0.4-0.864m Limited community ambulator (*<0.07 increased risk of falls, hospitalization, and mortality)1   >0.35m29mCommunity ambulator (1.2-1.823m/3823mo safely cross street)1   <0.35m/s64mdicative of frailty2   <1.23m/s 47mntifies well-functioning people at high risk of adverse health-related outcomes3   1. BowdenArman Filter.,Lala Lundrman, A. L.,[place]. (2008). Validation of a speed-based classification system using quantitative measures of walking performance poststroke. Neurorehabilitation and neural repair, 22(6), [phone]:OpenBloggers.co.uklegg,Janetta Hora& [place]). Diagnostic test accuracy of simple instruments for identifying frailty in community-dwelling older people: a systematic review. Age And Ageing, 44(1), 148-152. https:BankingDealers.co.zaesariViviano SimasPenninx, B., Nicklas, B., Simonsick, E., &  NewmanLucia Gaskinst al. (2005). Prognostic Value of Usual Gait Speed in Well-Functioning Older PeopleResults from the Health, Aging and Body Composition Study. [place]0), [phone]:HikingMonthly.fiMCID for Geriatrics: 0.1-0.1823m/s1,  2    1. Donalda Ewings., Mody, S., Woodman, R., & Studenski, S. (2006). Meaningful Change and Responsiveness in Common Physical Performance Measures in Older Adults. [place], 54(5), [phone]. CPAsOnDemand.dk  2. Bohannon, R., & Glenney, S. (2014). Minimal clinically important difference for change in comfortable gait speed of adults with pathology: a systematic review. Journal Of Evaluation In Clinical Practice, 20(4), 295-300. PharmacyEncyclopedia.com.pt.12158      Patient Education:     Patient Education  Patient Education: Yes  Educated in disease process: Yes  Educated in home exercise program: Yes    ASSESSMENT  [name] is a 69 y.o. male with a history of Diagnosis: Multiple Sclerosis. He currently presents to outpatient physical therapy with deficits in sensation, strength and balance resulting in difficulty in transfers, ambulation, stair navigation and ADLs. Skilled physical therapy services indicated to maximize independence with functional mobility and address goals below.     Norah Fick has so far made good improvement in terms of their overall pain and function. He has improved in nearly all categories, but still struggles with active dorsiflexion and clearing the feet in gait. We trialed the [place] Foot-Up but Mirl was not pleased with these, as they can be difficult to don / doff. Perhaps a trial of a regular AFO may be warranted at some point.  I would recommend continued PT at this time to progress towards their goals listed below. 03/07/2020    The following comorbidities may affect treatment/recovery: Cardiac history  Multiple  sclerosis (MS)  Total knee replacement and the following Personal factors may affect treatment/recovery: Advanced age.       Clinical presentation:evolving    Patient complexity is low level as indicated by above personal factors, environmental factors and comorbidities in addition to their impairments found on physical exam.      Rehab potential/prognosis: good  Patient's understanding: good      Short term goals: 4 weeks  HEP/Family Training Goals:   Patient will be independent with basic home exercise routine. MET  Outcome Measures Goals:   DGI: Patient will achieve at least a 19/24 on the Dynamic Gait Index in order to limit fall risk. MET  Balance/Vestibular Goals:   Patient will be able to maintain Sharpened Romberg >= 20 seconds with eyes open without LOB indicating improved standing balance. MET  Functional Mobility Goals:  Patient will be assessed for drop foot / dorsiflexion assist bracing (Foot-Up) MET    Long Term Goals: 12 weeks  HEP/Family Training Goals:   Patient will be independent with comprehensive home exercise routine.  Outcome Measures Goals:   DGI: Patient will achieve at least a 22/24 on the Dynamic Gait Index in order to limit fall risk.  Balance/Vestibular Goals:   Patient will be able to maintain Sharpened Romberg >= 30 seconds with eyes open without LOB indicating improved standing balance.  Patient will be able to return to pickleball safely in some capacity.      PLAN  Plan of Care: Continue PT  PT interventions: AROM/PROM/Therapeutic exercise, Balance activites, Closed chain activites, Flexibility, Gait training/Functional activities, General conditioning, Home exercise program instruction, Manual therapy, Neuromuscular Re-education, Patient/Family Education, Postural training/body Dealer education, Proprioceptive training, Strengthening, Therapeutic Activities  PT frequency:  Once a week  PT duration: 12 weeks      Thank you for the referral.  If you have any questions and/or  concerns, please feel free to contact me at [phone].    Tiajuana Amass,  PT    Department of Physical [place] C[facility]    Physician:  Morton Peters, MD    I have reviewed your progress note and agree with the documented goals and Plan of Care      03/07/2020

## 2020-03-07 NOTE — Progress Notes (Signed)
Physical Therapy Daily Flowsheet:  *Please see Physical Therapy Exercise Flowsheet for details regarding exercises completed this session.*     03/07/20 1100   Overview   Diagnosis Multiple Sclerosis   Insurance Medicare Blue Choice   Script Date 01/22/20   Visit # 4   Additional Comments S: I cannot tell much of a difference so far but I've continued to be active, just not as much exercise as I'd like to do.   Pain Assessment   Pain Denies   Functional Outcome Measures   Gait on level surface 3   Change in Gait Speed 3   Gait with horizontal head turns 2   Gait with  Vertical Head Turns 2   Gait and Pivot Turn 2   Stepping Over Obstacle 2   Stepping Around Obstacle 3   Steps 2   Dynamic Gait Index Score 19   Gait Speed Yes   Distance(meters) 10   Time(sec) 5.86   Meters/Sec 1.71   Additional comments: no AD   Patient Education   Patient Education Yes   Educated in disease process Yes   Educated in home exercise program Yes   Additional Patient Education HEP: Double down on calf stretching   Time Calculation   PT Timed Codes 23   PT Untimed Codes 0   PT Unbilled Time 0   PT Total Treatment 23   Dallie Dad, PT

## 2020-03-07 NOTE — Progress Notes (Signed)
Physical Therapy Exercise Flowsheet:  *Please refer to Physical Therapy Daily Flowsheet for further details of this session.*     03/07/20 1100   Balance Exercises   additional exercise PN Measurements and Assessment   Total time 23 min therex   Dallie Dad, PT

## 2020-03-08 ENCOUNTER — Telehealth: Payer: Self-pay

## 2020-03-08 NOTE — Telephone Encounter (Signed)
Spoke with patient, answered the following questions:   Have you recently had a Covid vaccine or are you planning on getting one soon? Yes  Do you feel like you could have an infection or are you taking antibiotics for an infection? No

## 2020-03-12 ENCOUNTER — Telehealth: Payer: Self-pay

## 2020-03-12 ENCOUNTER — Ambulatory Visit: Payer: Medicare (Managed Care) | Attending: Adult Health

## 2020-03-12 VITALS — BP 118/75 | HR 79 | Temp 99.1°F | Resp 16

## 2020-03-12 DIAGNOSIS — G35 Multiple sclerosis: Secondary | ICD-10-CM

## 2020-03-12 MED ORDER — SODIUM CHLORIDE 0.9 % IV SOLN WRAPPED *I*
300.0000 mg | Freq: Once | INTRAVENOUS | Status: AC
Start: 2020-03-12 — End: 2020-03-12
  Administered 2020-03-12: 300 mg via INTRAVENOUS
  Filled 2020-03-12: qty 15

## 2020-03-12 NOTE — Telephone Encounter (Signed)
[name] was in for his #6 tysabri infusion today, he developed shaking chills when the tysabri infusion ended stating he was cold, warm blankets applied.He said this has not happened with previous infusions Chills subsided in 30 minutes. His temp on admission 97 and at discharge 99.3. could this be a reaction?

## 2020-03-12 NOTE — Progress Notes (Signed)
Patient received Tysabri 300 mg IV infusion #  6  Ambulatory but gait is unsteady Tolerating tysabri only complaint is fatigue.  C/o shaking chills after infusion completed denies this happening with prior infusions  T36.6  Warm blankets applied 1500 chills subsided c/o fatigue 1535 T 37.3 states he feels better and denies feeling cold .Patient discharged to home, stable with after visit summary given and explained.  Marland Kitchen

## 2020-03-14 ENCOUNTER — Encounter: Payer: Self-pay | Admitting: Neurology

## 2020-03-14 NOTE — Telephone Encounter (Signed)
Concerning for possible Tysabri antibodies. WIll order antibody testing. Left voicemail asking that he check his Mychart message.

## 2020-03-15 ENCOUNTER — Other Ambulatory Visit
Admission: RE | Admit: 2020-03-15 | Discharge: 2020-03-15 | Disposition: A | Discharge status: Home / Self Care | Payer: Medicare (Managed Care) | Source: Ambulatory Visit | Attending: Neurology | Admitting: Neurology

## 2020-03-15 DIAGNOSIS — G35 Multiple sclerosis: Secondary | ICD-10-CM | POA: Insufficient documentation

## 2020-03-15 DIAGNOSIS — Z0000 Encounter for general adult medical examination without abnormal findings: Secondary | ICD-10-CM | POA: Insufficient documentation

## 2020-03-15 DIAGNOSIS — Z125 Encounter for screening for malignant neoplasm of prostate: Secondary | ICD-10-CM | POA: Insufficient documentation

## 2020-03-15 LAB — RENAL FUNCTION PANEL
Albumin: 4.4 g/dL (ref 3.5–5.2)
Anion Gap: 7 (ref 7–16)
CO2: 30 mmol/L — ABNORMAL HIGH (ref 20–28)
Calcium: 9.4 mg/dL (ref 8.6–10.2)
Chloride: 100 mmol/L (ref 96–108)
Creatinine: 1.15 mg/dL (ref 0.67–1.17)
GFR,Black: 74 *
GFR,Caucasian: 64 *
Glucose: 73 mg/dL (ref 60–99)
Lab: 16 mg/dL (ref 6–20)
Phosphorus: 3.5 mg/dL (ref 2.7–4.5)
Potassium: 4.5 mmol/L (ref 3.3–5.1)
Sodium: 137 mmol/L (ref 133–145)

## 2020-03-15 LAB — LIPID PANEL
Chol/HDL Ratio: 3.2
Cholesterol: 172 mg/dL
HDL: 54 mg/dL (ref 40–60)
LDL Calculated: 94 mg/dL
Non HDL Cholesterol: 118 mg/dL
Triglycerides: 122 mg/dL

## 2020-03-15 LAB — PSA (EFF.4-2010): PSA (eff. 4-2010): 0.92 ng/mL (ref 0.00–4.00)

## 2020-03-15 NOTE — Telephone Encounter (Signed)
Will forward pt's MyChart response to Dr. Ardelia Mems as Lorain Childes.  Responded to pt encouraging him to have lab work completed that Dr. Ardelia Mems recommended as soon as possible.

## 2020-03-17 LAB — HEMOGLOBIN A1C: Hemoglobin A1C: 5.3 %

## 2020-03-18 ENCOUNTER — Encounter: Payer: Self-pay | Admitting: Otolaryngology

## 2020-03-21 ENCOUNTER — Ambulatory Visit: Payer: Medicare (Managed Care) | Attending: Neurology | Admitting: Rehabilitative and Restorative Service Providers"

## 2020-03-21 DIAGNOSIS — G35 Multiple sclerosis: Secondary | ICD-10-CM

## 2020-03-21 NOTE — Progress Notes (Addendum)
Physical Therapy Exercise Flowsheet:  *Please refer to Physical Therapy Daily Flowsheet for further details of this session.*     03/21/20 1200   Hip Exercises   Hip Abduction, Standing Comment 10x2, 4lbs   Hip Extension, Standing Comment 10x2, 4lbs   Hip Flexion, Standing, Theraband Comment 10x2, 4lbs   additional exercise Standing hamstring curls 10x2, 4lbs   Total time 30 min therex   Dallie Dad, PT

## 2020-03-21 NOTE — Progress Notes (Signed)
Physical Therapy Daily Flowsheet:  *Please see Physical Therapy Exercise Flowsheet for details regarding exercises completed this session.*     03/21/20 1200   Overview   Diagnosis Multiple Sclerosis   Insurance Medicare Blue Choice   Script Date 01/22/20   Visit # 5   Additional Comments S: Doing fairly well, trying to stay active   Pain Assessment   Pain Denies   Patient Education   Patient Education Yes   Educated in disease process Yes   Educated in home exercise program Yes   Additional Patient Education HEP: Standing hip program, standing hamstring curls   Time Calculation   PT Timed Codes 30   PT Untimed Codes 0   PT Unbilled Time 0   PT Total Treatment 30   Dallie Dad, PT

## 2020-03-26 ENCOUNTER — Ambulatory Visit
Admission: RE | Admit: 2020-03-26 | Discharge: 2020-03-26 | Disposition: A | Discharge status: Home / Self Care | Payer: Medicare (Managed Care) | Source: Ambulatory Visit

## 2020-03-26 DIAGNOSIS — M5134 Other intervertebral disc degeneration, thoracic region: Secondary | ICD-10-CM

## 2020-03-26 DIAGNOSIS — G35 Multiple sclerosis: Secondary | ICD-10-CM

## 2020-03-26 DIAGNOSIS — M47814 Spondylosis without myelopathy or radiculopathy, thoracic region: Secondary | ICD-10-CM

## 2020-03-26 MED ORDER — GADOTERIDOL 279.3 MG/ML (PROHANCE) IV SOLN *I*
15.0000 mL | Freq: Once | INTRAVENOUS | Status: AC
Start: 2020-03-26 — End: 2020-03-26
  Administered 2020-03-26: 15 mL via INTRAVENOUS

## 2020-03-27 ENCOUNTER — Other Ambulatory Visit: Payer: Medicare (Managed Care)

## 2020-03-28 ENCOUNTER — Encounter: Payer: Self-pay | Admitting: Neurology

## 2020-03-28 DIAGNOSIS — G35 Multiple sclerosis: Secondary | ICD-10-CM

## 2020-03-28 LAB — NATALIZUMAB AB: Natalizumab Ab: POSITIVE — AB

## 2020-03-29 NOTE — Telephone Encounter (Signed)
Component      Latest Ref Rng & Units 03/15/2020   Natalizumab Ab      Negative Positive (A)     Next infusion 04/08/20. Please advise.

## 2020-03-29 NOTE — Telephone Encounter (Signed)
Discussed that +natalizumab antibody, so would need to change meds. Opted for Ocrevus (small risk of serious infection, serious allergic reaction and potentially increased malignancy risk). Will have less response to vaccines in the future (already had COVID vaccine). Goal of 8 weeks of less between last Tysabri and 1st Ocrevus. If there is a delay we could "buy time" with high dose steroids if needed.   He will complete enrollment forms, have blood drawn. PNA vaccines already complete.  Will get next MRI in 1 year (since not changing due to breakthrough disease).      [employer], please send through Mychart Ocrevus enrollment and monoclonal antibody form to patient.    Infusion nursing, he will not get any more Tysabri due to +Nabs. Is it possible to schedule initial Ocrevus infusions even before insurance approval? Goal of around 8 weeks or less since last Tysabri infusion.

## 2020-03-30 ENCOUNTER — Encounter: Payer: Self-pay | Admitting: Neurology

## 2020-03-30 ENCOUNTER — Other Ambulatory Visit
Admission: RE | Admit: 2020-03-30 | Discharge: 2020-03-30 | Disposition: A | Discharge status: Home / Self Care | Payer: Medicare (Managed Care) | Source: Ambulatory Visit | Attending: Neurology | Admitting: Neurology

## 2020-03-30 DIAGNOSIS — G35 Multiple sclerosis: Secondary | ICD-10-CM | POA: Insufficient documentation

## 2020-03-30 LAB — LYMPHOCYTE SUBSET (T & B CELLS)
B LYM #(CD19): 154 cells/uL (ref 120–725)
B LYM %(CD19): 12 % (ref 7–36)
CD4#: 653 cells/uL (ref 496–2186)
CD4%: 46 % (ref 32–71)
CD4/CD8: 3.7 — ABNORMAL HIGH (ref 0.7–3.0)
CD8#: 178 cells/uL (ref 177–1137)
NK LYM #(CD16+56): 336 cells/uL (ref 37–758)
NK LYM %(CD16+56): 26 % (ref 4–26)
NK LYM# (CD3+16+56+): 17 cells/uL
NK LYM% (CD3+16+56+): 1 %
T LYM #(CD3): 821 cells/uL (ref 754–2810)
T LYM %(CD3): 60 % (ref 54–87)
T Suppress %(CD8): 13 % (ref 10–38)

## 2020-03-30 LAB — COMPREHENSIVE METABOLIC PANEL
ALT: 25 U/L (ref 0–50)
AST: 33 U/L (ref 0–50)
Albumin: 4.2 g/dL (ref 3.5–5.2)
Alk Phos: 79 U/L (ref 40–130)
Anion Gap: 9 (ref 7–16)
Bilirubin,Total: 0.7 mg/dL (ref 0.0–1.2)
CO2: 28 mmol/L (ref 20–28)
Calcium: 9.5 mg/dL (ref 8.6–10.2)
Chloride: 103 mmol/L (ref 96–108)
Creatinine: 1.03 mg/dL (ref 0.67–1.17)
GFR,Black: 85 *
GFR,Caucasian: 73 *
Glucose: 70 mg/dL (ref 60–99)
Lab: 18 mg/dL (ref 6–20)
Potassium: 4.6 mmol/L (ref 3.3–5.1)
Sodium: 140 mmol/L (ref 133–145)
Total Protein: 6.5 g/dL (ref 6.3–7.7)

## 2020-03-30 LAB — CBC AND DIFFERENTIAL
Baso # K/uL: 0 10*3/uL (ref 0.0–0.1)
Basophil %: 0.8 %
Eos # K/uL: 0.1 10*3/uL (ref 0.0–0.5)
Eosinophil %: 1.9 %
Hematocrit: 44 % (ref 40–51)
Hemoglobin: 15 g/dL (ref 13.7–17.5)
IMM Granulocytes #: 0 10*3/uL (ref 0.0–0.0)
IMM Granulocytes: 0.4 %
Lymph # K/uL: 1.5 10*3/uL (ref 1.3–3.6)
Lymphocyte %: 28.3 %
MCH: 29 pg (ref 26–32)
MCHC: 34 g/dL (ref 32–37)
MCV: 86 fL (ref 79–92)
Mono # K/uL: 0.6 10*3/uL (ref 0.3–0.8)
Monocyte %: 10.6 %
Neut # K/uL: 3 10*3/uL (ref 1.8–5.4)
Nucl RBC # K/uL: 0 10*3/uL (ref 0.0–0.0)
Nucl RBC %: 0 /100 WBC (ref 0.0–0.2)
Platelets: 214 10*3/uL (ref 150–330)
RBC: 5.1 MIL/uL (ref 4.6–6.1)
RDW: 12.4 % (ref 11.6–14.4)
Seg Neut %: 58 %
WBC: 5.2 10*3/uL (ref 4.2–9.1)

## 2020-03-31 LAB — HEPATITIS B & C PROF
HBV Core Ab: NEGATIVE
HBV S Ab Quant: 0.65 m[IU]/mL
HBV S Ab: NEGATIVE
HBV S Ag: NEGATIVE
Hep C Ab: NEGATIVE

## 2020-03-31 LAB — IGA: IgA: 183 mg/dL (ref 70–400)

## 2020-03-31 LAB — VARICELLA ZOSTER IGG AB: VZV IgG: POSITIVE

## 2020-03-31 LAB — IGM: IgM: 254 mg/dL — ABNORMAL HIGH (ref 40–230)

## 2020-03-31 LAB — IGG: IgG: 832 mg/dL (ref 700–1600)

## 2020-04-02 ENCOUNTER — Ambulatory Visit: Payer: Medicare (Managed Care) | Admitting: Rehabilitative and Restorative Service Providers"

## 2020-04-02 DIAGNOSIS — G35 Multiple sclerosis: Secondary | ICD-10-CM

## 2020-04-02 LAB — TB AG T-CELL STIMULATION: TB Ag T-Cell Stimulation: 0

## 2020-04-02 NOTE — Progress Notes (Signed)
Physical Therapy Exercise Flowsheet:  *Please refer to Physical Therapy Daily Flowsheet for further details of this session.*     04/02/20 1300   Gym Equipment Exercises   additional exercise Leg Press: 10x5. Warmup at 3.5 pl, did one set at 4.5pl, then 3 sets at 6.5pl, tol well   Total time 10 min therex   Foot/Ankle Exercises   Achilles Stretch, Gastroc, Standing Comment slantboard, 3x20s   additional exercise seated hamstring stretch, 3x20s, b/l   Total time 10 min therex   Balance Exercises   additional exercise Biodex Random Control. 2x120s games. Medium difficulty with smallest circle for the second game   additional exercise Biodex Maze Control: 2 games, medium and hard difficulty   Total time 10 min neuro reed   Dallie Dad, PT

## 2020-04-02 NOTE — Progress Notes (Signed)
Physical Therapy Daily Flowsheet:  *Please see Physical Therapy Exercise Flowsheet for details regarding exercises completed this session.*     04/02/20 0900   Overview   Diagnosis Multiple Sclerosis   Insurance Medicare Blue Choice   Script Date 01/22/20   Visit # 6   Additional Comments S: I am going to change my infusion medication in the future. The most recent MRI did not show any new lesions.   Pain Assessment   Pain Denies   Patient Education   Patient Education Yes   Educated in disease process Yes   Educated in home exercise program Yes   Additional Patient Education HEP: Seated hamstring stretch   Time Calculation   PT Timed Codes 30   PT Untimed Codes 0   PT Unbilled Time 0   PT Total Treatment 30   Dallie Dad, PT

## 2020-04-08 ENCOUNTER — Ambulatory Visit: Payer: Medicare (Managed Care)

## 2020-04-09 ENCOUNTER — Telehealth: Payer: Self-pay

## 2020-04-09 NOTE — Telephone Encounter (Signed)
Yikes, this sounds very expensive for the patient. Would rituximab or generic rituximab be less expensive for him? If so, we could use either since they are medically similar to Ocrevus.

## 2020-04-09 NOTE — Telephone Encounter (Signed)
You can call him with the cost and see if he still wants to proceed.  In the event he does not want this medication due to cost, you can let him know that we have made Dr. Ardelia Mems aware of the costs.  & she will reach out with alternative options (just be sure to update this message with his decision so Dr. Ardelia Mems knows if she needs to call him).    Dr. Dedra Skeens will cost this patient $3155.77 per infusion he did not meet income requirements for free drug and [facility] financial assistance.

## 2020-04-09 NOTE — Telephone Encounter (Signed)
Pt tentatively scheduled for Ocrevus 8 weeks after last Tysabri, 7/27 and 8/11. Waiting on authorization.

## 2020-04-09 NOTE — Telephone Encounter (Signed)
Left voicemail for him to call me so I discuss OOP.

## 2020-04-10 NOTE — Telephone Encounter (Signed)
Sent to price estimation.

## 2020-04-10 NOTE — Telephone Encounter (Signed)
Tiffany can you send to price est for Ruxience, just so we know in case Ocrevus doesn't work out?  His insurance will require generic Rituxan

## 2020-04-11 NOTE — Telephone Encounter (Signed)
Received price est for Ruxience b/c he has a $3400 OOP maximum no matter what infusion he goes on it will cost him $3400, however after he pays that $3400 he would not have to pay for any other medical costs (diagnostic testing, office visits, etc) until January 1st each year.  Tiffany is going to call him to explain this and see what he would like to do.  Tiffany will update after she speaks with him.  Thanks

## 2020-04-12 ENCOUNTER — Ambulatory Visit: Payer: Medicare (Managed Care) | Admitting: Rehabilitative and Restorative Service Providers"

## 2020-04-18 NOTE — Telephone Encounter (Signed)
Spoke with pt who opts for Ocrevus despite it being pricey for him. He would not want to do injections, so will not inquire if Kesimpta is less expensive for him.    INfusion nurses, he is all set medically to go ahead with scheduled initial Ocrevus infusions.

## 2020-04-18 NOTE — Telephone Encounter (Signed)
Spoke with patient who needed to change his 2nd Ocrevus infusion from 8/11 to 8/17

## 2020-04-20 ENCOUNTER — Other Ambulatory Visit: Payer: Self-pay | Admitting: Adult Health

## 2020-04-20 DIAGNOSIS — G35 Multiple sclerosis: Secondary | ICD-10-CM

## 2020-04-21 ENCOUNTER — Other Ambulatory Visit: Payer: Self-pay | Admitting: Urology

## 2020-04-22 NOTE — Telephone Encounter (Signed)
Are you able to find out if he would qualify for free drug for Kesimpta b/c he has medicare he is not eligible for copay assistance, his income is over 100k per year in case you need that info Thanks

## 2020-04-22 NOTE — Telephone Encounter (Signed)
Nevermind I see message below about not wanting to proceed with Kesipmta

## 2020-04-23 ENCOUNTER — Ambulatory Visit: Payer: Medicare (Managed Care) | Admitting: Rehabilitative and Restorative Service Providers"

## 2020-04-29 ENCOUNTER — Telehealth: Payer: Self-pay

## 2020-04-29 NOTE — Telephone Encounter (Signed)
Need order for Ocrevus. NOT on free drug

## 2020-04-30 ENCOUNTER — Other Ambulatory Visit: Payer: Self-pay | Admitting: Neurology

## 2020-05-06 ENCOUNTER — Ambulatory Visit: Payer: Medicare (Managed Care)

## 2020-05-06 ENCOUNTER — Telehealth: Payer: Self-pay

## 2020-05-06 NOTE — Telephone Encounter (Addendum)
Spoke with patient, answered the following questions:   Have you recently had a Covid vaccine or are you planning on getting one soon? No  Do you feel like you could have an infection or are you taking antibiotics for an infection? No

## 2020-05-07 ENCOUNTER — Ambulatory Visit: Payer: Medicare (Managed Care) | Attending: Neurology

## 2020-05-07 VITALS — BP 112/61 | HR 73 | Temp 98.2°F | Resp 16

## 2020-05-07 DIAGNOSIS — G35 Multiple sclerosis: Secondary | ICD-10-CM | POA: Insufficient documentation

## 2020-05-07 MED ORDER — CETIRIZINE HCL 10 MG PO TABS *I*
10.0000 mg | ORAL_TABLET | Freq: Once | ORAL | Status: AC
Start: 2020-05-07 — End: 2020-05-07
  Administered 2020-05-07: 10 mg via ORAL

## 2020-05-07 MED ORDER — SODIUM CHLORIDE 0.9 % IV SOLN WRAPPED *I*
50.0000 mL/h | Status: DC | PRN
Start: 2020-05-07 — End: 2020-05-07
  Administered 2020-05-07: 50 mL/h via INTRAVENOUS
  Administered 2020-05-07: 50 mL/h

## 2020-05-07 MED ORDER — ACETAMINOPHEN 325 MG PO TABS *I*
975.0000 mg | ORAL_TABLET | Freq: Once | ORAL | Status: AC
Start: 2020-05-07 — End: 2020-05-07
  Administered 2020-05-07: 975 mg via ORAL

## 2020-05-07 MED ORDER — DIPHENHYDRAMINE HCL 25 MG ORAL SOLID *WRAPPED*
25.0000 mg | Freq: Once | ORAL | Status: AC
Start: 2020-05-07 — End: 2020-05-07
  Administered 2020-05-07: 25 mg via ORAL

## 2020-05-07 MED ORDER — SODIUM CHLORIDE 0.9 % IV SOLN WRAPPED *I*
300.0000 mg | Freq: Once | INTRAVENOUS | Status: AC
Start: 2020-05-07 — End: 2020-05-07
  Administered 2020-05-07: 300 mg via INTRAVENOUS
  Filled 2020-05-07: qty 10

## 2020-05-07 MED ORDER — METHYLPREDNISOLONE SOD SUCC 125 MG IJ SOLR(62.5MG/ML) *WRAPPED*
125.0000 mg | Freq: Once | INTRAMUSCULAR | Status: AC
Start: 2020-05-07 — End: 2020-05-07
  Administered 2020-05-07: 125 mg via INTRAVENOUS

## 2020-05-07 NOTE — Patient Instructions (Signed)
Please reach out to the clinic with any additional questions or concerns. We encourage you to sign up and use MyChart for any non-urgent medical questions as MyChart is the preferred method of communication.  Please contact your pharmacy for medication refills and allow up to 2 business days for routine prescription refills.  For urgent messages, please call the clinic at [phone].  The clinic is open from 8am to 4:30pm Monday through Friday.    Continue with your usual medications as prescribed.    Ocrevus can increase your risk of respiratory, skin, and herpes infections, please call [phone] with any of the following:    • Fevers/chills  • Cough that will not go away  • Signs and symptoms of respiratory infections  • Cold sores/shingles/genital sores  • Itching/rashes/hives    Side effects can occur up to a week after infusion.  You may have more chance of getting an infection. Wash hands often. Stay away from people with infections, colds, or flu.    You should not receive any live vaccines while on Ocrevus.  Please check with your MD first. Be sure to have regular breast exams. Your doctor will tell you know how often to have these. You will also need to do breast self-exams as your doctor has told you.  Women should avoid pregnancy while taking this medication. Let your Doctor know right a way if you become pregnant.    Schedule a follow-up with your provider 4 or 5 months after your infusion.    If you have an insurance changes, please notify the office at [phone] as benefits must be verified.

## 2020-05-07 NOTE — Progress Notes (Signed)
Pt arrives for Ocrevus infusion #1 fully ambulatory. COVID screen completed. Denies recent illness, infection or fever. Ocrevus 300 mg infused per protocol. AVS / Discharge instructions reviewed with patient including COVID reinforcement. Pt tolerated infusion well, and was discharged home stable.

## 2020-05-08 ENCOUNTER — Encounter: Payer: Self-pay | Admitting: Neurology

## 2020-05-21 ENCOUNTER — Ambulatory Visit: Payer: Medicare (Managed Care) | Admitting: Urology

## 2020-05-22 ENCOUNTER — Ambulatory Visit: Payer: Medicare (Managed Care)

## 2020-05-22 ENCOUNTER — Encounter: Payer: Self-pay | Admitting: Neurology

## 2020-05-27 ENCOUNTER — Telehealth: Payer: Self-pay

## 2020-05-27 NOTE — Telephone Encounter (Signed)
I called [name] and he still interested and comfortable coming in for treatment.  I completed the COVID-19 screen, patient confirmed that they have not had any exposure and does not have any symptoms.  I explained that we are taking extra precautions and using some social distancing practices and patient knows to call if they start to develop symptoms or learns that they have come in contact with a confirmed case.  Spoke with patient, answered the following questions:   Have you recently had a Covid vaccine or are you planning on getting one soon? no  Do you feel like you could have an infection or are you taking antibiotics for an infection? no   No ins changes

## 2020-05-27 NOTE — Telephone Encounter (Signed)
Entered in error

## 2020-05-28 ENCOUNTER — Ambulatory Visit: Payer: Medicare (Managed Care) | Attending: Neurology

## 2020-05-28 VITALS — BP 123/68 | HR 67 | Temp 99.0°F | Resp 16

## 2020-05-28 DIAGNOSIS — G35 Multiple sclerosis: Secondary | ICD-10-CM | POA: Insufficient documentation

## 2020-05-28 MED ORDER — ACETAMINOPHEN 325 MG PO TABS *I*
975.0000 mg | ORAL_TABLET | Freq: Once | ORAL | Status: AC
Start: 2020-05-28 — End: 2020-05-28
  Administered 2020-05-28: 975 mg via ORAL

## 2020-05-28 MED ORDER — SODIUM CHLORIDE 0.9 % IV SOLN WRAPPED *I*
50.0000 mL/h | Status: DC | PRN
Start: 2020-05-28 — End: 2020-05-28
  Administered 2020-05-28: 50 mL/h via INTRAVENOUS

## 2020-05-28 MED ORDER — METHYLPREDNISOLONE SOD SUCC 125 MG IJ SOLR(62.5MG/ML) *WRAPPED*
125.0000 mg | Freq: Once | INTRAMUSCULAR | Status: AC
Start: 2020-05-28 — End: 2020-05-28
  Administered 2020-05-28: 125 mg via INTRAVENOUS

## 2020-05-28 MED ORDER — CETIRIZINE HCL 10 MG PO TABS *I*
10.0000 mg | ORAL_TABLET | Freq: Once | ORAL | Status: AC
Start: 2020-05-28 — End: 2020-05-28
  Administered 2020-05-28: 10 mg via ORAL

## 2020-05-28 MED ORDER — DIPHENHYDRAMINE HCL 25 MG ORAL SOLID *WRAPPED*
25.0000 mg | Freq: Once | ORAL | Status: AC
Start: 2020-05-28 — End: 2020-05-28
  Administered 2020-05-28: 25 mg via ORAL

## 2020-05-28 MED ORDER — SODIUM CHLORIDE 0.9 % IV SOLN WRAPPED *I*
300.0000 mg | Freq: Once | INTRAVENOUS | Status: AC
Start: 2020-05-28 — End: 2020-05-28
  Administered 2020-05-28: 300 mg via INTRAVENOUS
  Filled 2020-05-28: qty 10

## 2020-05-28 NOTE — Patient Instructions (Signed)
Continue with your usual medications as prescribed.    Ocrevus can increase your risk of respiratory, skin, and herpes infections, please call [phone] with any of the following:    • Fevers/chills  • Cough that will not go away  • Signs and symptoms of respiratory infections  • Cold sores/shingles/genital sores  • Itching/rashes/hives    Side effects can occur up to a week after infusion.  You may have more chance of getting an infection. Wash hands often. Stay away from people with infections, colds, or flu.    You should not receive any live vaccines while on Ocrevus.  Please check with your MD first. Be sure to have regular breast exams. Your doctor will tell you know how often to have these. You will also need to do breast self-exams as your doctor has told you.  Women should avoid pregnancy while taking this medication. Let your Doctor know right a way if you become pregnant.    Schedule a follow-up with your provider 4 or 5 months after your infusion.    If you have an insurance changes, please notify the office at [phone] as benefits must be verified.        Please reach out to the clinic with any additional questions or concerns. We encourage you to sign up and use MyChart for any non-urgent medical questions as MyChart is the preferred method of communication.  Please contact your pharmacy for medication refills and allow up to 2 business days for routine prescription refills.  For urgent messages, please call the clinic at [phone].  The clinic is open from 8am to 4:30pm Monday through Friday.

## 2020-05-28 NOTE — Progress Notes (Signed)
Pt arrives for Ocrevus infusion #2 . Denies recent illness, infection, or illness. Tolerated first infusion without side effects only complaint was fatigue for a few days. Reviewed side effects and medication verbalized understanding Ocrevus infused per protocol. Pt tolerated infusion well, and was discharged home stable.

## 2020-06-01 ENCOUNTER — Encounter: Payer: Self-pay | Admitting: Neurology

## 2020-06-18 ENCOUNTER — Encounter: Payer: Self-pay | Admitting: Neurology

## 2020-06-25 ENCOUNTER — Encounter: Payer: Self-pay | Admitting: Primary Care

## 2020-07-17 ENCOUNTER — Encounter: Payer: Self-pay | Admitting: Neurology

## 2020-07-18 NOTE — Telephone Encounter (Signed)
Will defer pts MyChart message to Dr. Robb

## 2020-07-23 ENCOUNTER — Ambulatory Visit: Payer: Medicare (Managed Care) | Admitting: Orthopedic Surgery

## 2020-07-26 ENCOUNTER — Encounter: Payer: Self-pay | Admitting: Neurology

## 2020-07-26 ENCOUNTER — Ambulatory Visit: Payer: Medicare (Managed Care) | Attending: Neurology | Admitting: Neurology

## 2020-07-26 VITALS — BP 124/82 | HR 65 | Temp 97.7°F

## 2020-07-26 DIAGNOSIS — G35 Multiple sclerosis: Secondary | ICD-10-CM | POA: Insufficient documentation

## 2020-07-26 NOTE — Progress Notes (Signed)
Multiple Sclerosis Clinic Follow-up Visit    Subjective:  [name] is a 69 y.o. 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 Since his last clinic visit he has not new neuorologic issues.  Gait issues ongoing, continues walking and playing golf. Using a rowing machine. NOt falling. Balance is not worse. PT not helpful.  Ampyra use ongoing. Whn he stopped, gait declined. Foot up not helpful.    +Hand/foot tingling at baseline.  Urologist follow-up is ongoing.     Tysabri started 1/21,stopped due to nataliazumab antibodies+. Started Ocrevus 7/21. Well tolerated.    Moderna COVID vaccines x3. He remains cautious in terms of COVID. Working from home.   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 He might go to part-time. 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 He is bulding a 1 story home.    MS History:  Clinical presentations:   1990's R arm weakness   Later numbness hands and feet and stereotyped abnormal distal R thigh sensation  MRIs:   4/08 brain-relatively mild lesion burden, 1 enhancing lesion at the time   5/13 brain-no new lesions   11/14 brain-unchanged   10/16 brain-unchanged   4/18 brain-unchanged                 Thoracic-few, non-enhancing intramedullary lesions   12/20 brain-unchanged                  Thoracic cord- at least 3 new intramedullary lesions (no contrast given)   6/21 brain and thoracic cord-unchanged  Other testing:   12/20 JC index 0.33, indeterminate to negative   6/21 natalizumab antibody +   6/21 Cr 1.03  Disease modifying treatment hx:   Betaseron started in 1990's, stopped due to injection site reactions/infections   Tecfidera started 5/13, stopped due to new thoracic cord lesions   1/21 Tysabri started, stopped due to +natalizumab antibody   Ocrevus started 7/21 and continued through the present  Symptomatic treatment hx:   ED-Cialis helps   Foot cramps-baclofen can be helpful   Stereotyped R thigh abnormal sensation-less bothersome,  not certain if Tegretol helped   Knee pain-acupuncture   Gait-Ampyra, gait worsened when it was stopped. PT less helpful in the past. 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 Never smoker.       Meds:  Current Outpatient Medications on File Prior to Visit   Medication Sig Dispense Refill    dalfampridine (AMPYRA) 10 MG tablet TAKE 1 TABLET TWICE A DAY 60 tablet 11    tadalafil (CIALIS) 5 MG tablet TAKE 1 TABLET BY MOUTH EVERY DAY  90 tablet 1    cholecalciferol (VITAMIN D) 1000 UNIT tablet Take 1,000 Units by mouth daily      Cyanocobalamin (VITAMIN B-12 CR PO) Take 1,000 mcg by mouth daily         diazePAM (VALIUM) 5 MG tablet Take 1 tab PO PRN anxiety 60 minutes before MRI. May repeat as needed x3. Must have a driver that day. (Patient not taking: Reported on 07/26/2020) 4 tablet 0     No current facility-administered medications on file prior to visit.        ROS:  10+ROS reviewed. Pertinent positives and negatives in HPI.     Vitals  Blood pressure 124/82, pulse 65, temperature 36.5 C (97.7 F).    PE:  Gen: Well appearing, NAD.  MS: Alert. Fluent. Affect appropriate for situation.  CN:   Choppy pursuits. Full facial strength. No dysarthria.  Motor: No pronator drift. No abnormal movements.  Coord: No ataxia on FTN. FInger taps mildly slow on R.  Gait: Wide-based casual  gait, imbalanced.4.22 without assist device.    Assesment and Plan:  69 y.o. M with progressive MS with activity who tolerated change from Tysabri to Ocrevus due to +natalizumab antibody. Without recent relapse. Gait difficulties currently stable.  Ongoing exercise and use of Ampyra for symptomatic tx.  MRI brain 6/22.    FUA 6 months.      Ralonda Tartt, MD  Neuroimmunology      39 minutes spent with patient (>50% spent in counseling and coordination of care), in review of the chart on the same day as the visit.

## 2020-07-31 NOTE — Telephone Encounter (Signed)
Discussed at clinic visit that this is an [place] company that will likely be setting up a site in [place] to continue conducting stem cell therapy studies to address MS. At this point, they are not recruiting subjects in [place], but may in the future (once FDA approves IND application).

## 2020-08-09 ENCOUNTER — Encounter: Payer: Self-pay | Admitting: Neurology

## 2020-08-09 DIAGNOSIS — G35 Multiple sclerosis: Secondary | ICD-10-CM

## 2020-08-12 NOTE — Telephone Encounter (Signed)
Pr Gaetana Michaelis, NP last message to pt she was recommending pt start Gabapentin for increased burning pain. Do not see that Rx was sent to pharmacy.   Order placed and sent to Gaetana Michaelis, NP for signature.

## 2020-08-12 NOTE — Telephone Encounter (Signed)
Will defer pt's MyChart message regarding increased symptoms to NP pool for recommendations.  See MyChart messages for further details.

## 2020-08-13 MED ORDER — GABAPENTIN 300 MG PO CAPSULE *I*
300.0000 mg | ORAL_CAPSULE | Freq: Two times a day (BID) | ORAL | 1 refills | Status: DC
Start: 2020-08-13 — End: 2020-09-16

## 2020-08-19 ENCOUNTER — Encounter: Payer: Self-pay | Admitting: Neurology

## 2020-09-12 ENCOUNTER — Encounter: Payer: Self-pay | Admitting: Primary Care

## 2020-09-13 NOTE — Telephone Encounter (Signed)
Dr Mliss Fritz asked we sent to someone within the office.  I did inquire on hard stools or straining, but he only answered no straining, asked again on hard stools.Marland Kitchen

## 2020-09-13 NOTE — Telephone Encounter (Signed)
I agree sounds like a bleeding hemorrhoid or possibly anal fissure. I could see him in the office to evaluate or he could abide by conservative management over the weekend and see if the bleeding recurs. If the bleeding is rather scant then conservative management seems appropriate and includes a stool softener, increase fiber and hydration, avoiding straining during bowel movements, and frequent Sitz baths (can obtain over the counter).

## 2020-09-16 ENCOUNTER — Ambulatory Visit: Payer: Medicare (Managed Care) | Admitting: Medical

## 2020-09-16 VITALS — BP 120/80 | HR 68 | Temp 97.9°F | Wt 170.6 lb

## 2020-09-16 DIAGNOSIS — K648 Other hemorrhoids: Secondary | ICD-10-CM

## 2020-09-16 NOTE — Progress Notes (Signed)
IN-OFFICE VISIT DURING COVID-19 PANDEMIC    CC:   Chief Complaint   Patient presents with    Follow-up     BRBPR       HPI:  [name] is a 69 y.o.male here for   Chief Complaint   Patient presents with    Follow-up     BRBPR     [name] presents the office today with complaints of BRBPR.  He has noticed this has been occurring intermittently for the past month or so.  He states that last week he had 4 days in a row where he had a minimal amount of bright red blood in his stool.  However since then (approximately 3 days) he has not had any recurrence of bleeding.  He states that his stools are not hard and he actually had 1 day of diarrhea during that time span.  He is not straining while passing a bowel movement.  He admits to not trying any of the conservative measures recommended to him previously (avoiding straining, increase hydration, increase fiber, Colace, sitz bath).    He denies nausea, vomiting, abdominal pain.  He also denies fever, chills, night sweats, fatigue, chest pain, shortness of breath, dizziness, headedness, weakness.    [name] last colonoscopy was in 2016 and was normal without any hemorrhoids noted and only moderate diverticulosis noted.  He is due for repeat colonoscopy in 2026.    Review of Systems:  Constitutional: Denies fever or chills  HEENT: Denies headache, vision changes  Cardio: Denies chest pain, palpitations  Respiratory: Denies shortness of breath, cough  GI: Reports BRBPR.  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     HLD (hyperlipidemia)     Hyperlipidemia 05/30/2008    Normal TG, just normal HDL, and LDL as high as 150's at times. No premature CVD in family, mother with CAD late. Started statin in 2014, supplementing with coenzyme Q10 to minimize muscle side effects.     Incomplete Right Bundle Branch Block 05/30/2008          Multiple sclerosis     Osteoarthritis of right knee 02/01/2014    Psoriasis     Thoracic aortic aneurysm (TAA) 02/22/2009    mild- 4.0 cm  by CT scan 5/10 See 6/13 [facility] describing no change, plan for 24 month f/u See [id number] [facility] note. See 04/06/16 echo ([facility]) mild.     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    Procedure: RIGHT TOTAL KNEE ARTHROPLASTY;  Surgeon: Arley Phenix, MD;  Location: [facility]IN OR;  Service: Orthopedics    VASECTOMY         Allergies  Allergies   Allergen Reactions    Environmental Allergies Other (See Comments)     Congestion; itching; watery eyes    Environmental [Mold] Other (See Comments)     coughing     Family History   Problem Relation Age of Onset    Heart Disease Mother         coronary art disease, not premature    Cancer Father         multiple myeloma    Cancer Maternal Grandmother         unknown    Heart Disease Brother         aneurism-07-09-18    Multiple Sclerosis Neg Hx     Lupus Neg Hx     Rheum arthritis Neg Hx     Thyroid disease Neg Hx     Diabetes Neg Hx     Colon cancer Neg Hx     Colon polyps Neg Hx      High Blood Pressure Neg Hx      Social History     Socioeconomic History    Marital status: Married     Spouse name: Not on file    Number of children: Not on file    Years of education: Not on file    Highest education level: Not on file   Tobacco Use    Smoking status: Never Smoker    Smokeless tobacco: Never Used   Substance and Sexual Activity    Alcohol use: No     Comment: Very occasionaly    Drug use: Yes     Frequency: 2.0 times per week     Types: Marijuana    Sexual activity: Never   Other Topics Concern    Not on file   Social History Narrative    Engineer, 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 Has 2 older children.        Exercise -- doubles tennis or pickle ball up to 5 times weekly, joined [employer] and plans to add some resistance machines.        Diet -- varied, more lean proteins, complex carbs, not a lot of dessert, some greens.  Protein shakes at bkfst and lunch, flax, seeds, yogurt.        Sleep -- active dreams, variable sleep success.  No GU interruption.        Safety -- seatbelt and no distractions, smoke detectors, sunglasses, plans to be more consistent with skin block.       I updated the patient's personal, family, and social history today as documented in eRecord.    Current Outpatient Medications   Medication Sig Dispense Refill    Ocrelizumab (OCREVUS IV) Administer into the vein      dalfampridine (AMPYRA) 10 MG tablet TAKE 1 TABLET TWICE A DAY 60 tablet 11    tadalafil (CIALIS) 5 MG tablet TAKE 1 TABLET BY MOUTH EVERY DAY 90 tablet 1    cholecalciferol (VITAMIN D) 1000 UNIT tablet Take 1,000 Units by mouth daily      Cyanocobalamin (VITAMIN B-12 CR PO) Take 1,000 mcg by mouth daily          No current  facility-administered medications for this visit.       I reviewed the medication list with the patient in eRecord today and changes were not made.      Physical Exam:  Vitals:    09/16/20 1034   BP: 120/80   BP Location: Left arm   Patient Position: Sitting   Cuff Size: adult    Pulse: 68   Temp: 36.6 C (97.9 F)   SpO2: 97%   Weight: 77.4 kg (170 lb 9.6 oz)     Estimated body mass index is 23.79 kg/m as calculated from the following:    Height as of 03/22/20: 1.803 m (_0 ).    Weight as of this encounter: 77.4 kg (170 lb 9.6 oz).    Constitutional: Well-appearing, comfortable, no acute distress, normal weight  Eyes:  Equal, round and reactive, conjunctivae clear, lids normal  Abd: Abdomen soft, not tender, not distended, bowel sounds present, no hepatosplenomegaly  DRE: Anus normal to inspection without fissures or external hemorrhoids.  Good anal sphincter tone.  Approximately 1 cm nontender bulge about 1 cm internally to anus at 10 o'clock position  Extremities: Radial pulses intact bilaterally, no peripheral edema  Skin:  Warm, dry, good turgor, no rashes  Neuro: Speech intact, no obvious motor deficits, no obvious sensory deficits  Psych:  Alert and oriented x3, appropriate affect and mood         Assessment and Plan:   69 y.o.male presents today for   Chief Complaint   Patient presents with    Follow-up     BRBPR     1. Internal hemorrhoid       1.  Internal hemorrhoid-patient appears to have a well-healed internal hemorrhoid at the 10 o'clock position.  Hemorrhoid is not actively inflamed or bleeding today.  We discussed conservative measures to manage his condition as outlined below and also detailed in patient instructions.  He will let us know if any new or worsening symptoms develop, bleeding persists/worsens, or conservative measures are ineffective.  Next step would likely involve repeat colonoscopy.  Patient Instructions   Ingest 20 to 30 g of insoluble fiber per day and drink plenty of water (1.5 to 2 liters per day).  Refrain from straining or lingering (eg, reading) on the toilet.  Engage in regular physical exercise.  Limit intake of fatty foods and alcohol as they may cause constipation.    For flare ups:  1. Use a sitz bath with warm water two to three times per  day during flare ups of hemorrhoid  2. Try Preparation-H: Apply up to four times per day just as needed for bleeding symptoms.  3. If Prep-H is ineffective, you may try nitroglycerin rectal ointment.        Return if symptoms worsen or fail to improve.   No orders of the defined types were placed in this encounter.                Jani Moronta D'Hont, PA-C  [facility]

## 2020-09-16 NOTE — Patient Instructions (Signed)
Ingest 20 to 30 g of insoluble fiber per day and drink plenty of water (1.5 to 2 liters per day).  Refrain from straining or lingering (eg, reading) on the toilet.  Engage in regular physical exercise.  Limit intake of fatty foods and alcohol as they may cause constipation.    For flare ups:  1. Use a sitz bath with warm water two to three times per day during flare ups of hemorrhoid  2. Try Preparation-H: Apply up to four times per day just as needed for bleeding symptoms.  3. If Prep-H is ineffective, you may try nitroglycerin rectal ointment.

## 2020-10-03 ENCOUNTER — Encounter: Payer: Self-pay | Admitting: Neurology

## 2020-10-03 NOTE — Telephone Encounter (Signed)
Will defer pt's MyChart message to Dr. Ardelia Mems for any further recommendations.

## 2020-10-28 ENCOUNTER — Encounter: Payer: Self-pay | Admitting: Neurology

## 2020-11-18 ENCOUNTER — Telehealth: Payer: Self-pay

## 2020-11-18 NOTE — Telephone Encounter (Signed)
Need Ocrevus orders, NOT on free drug.

## 2020-11-19 ENCOUNTER — Other Ambulatory Visit: Payer: Self-pay | Admitting: Neurology

## 2020-11-20 NOTE — Patient Instructions (Signed)
Please reach out to the clinic with any additional questions or concerns. We encourage you to sign up and use MyChart for any non-urgent medical questions as MyChart is the preferred method of communication.  Please contact your pharmacy for medication refills and allow up to 2 business days for routine prescription refills.  For urgent messages, please call the clinic at [phone].  The clinic is open from 8am to 4:30pm Monday through Friday.

## 2020-11-21 ENCOUNTER — Encounter: Payer: Self-pay | Admitting: Neurology

## 2020-11-21 ENCOUNTER — Telehealth: Payer: Self-pay | Admitting: Neurology

## 2020-11-21 NOTE — Telephone Encounter (Signed)
Spoke with pt about Evusheld. He does not have hx of MI, known CAD or stroke, although he has hx of AAA. We discussed that this may indicate a somewhat higher cardiac risk, but that he feels the potential benefit of Evusheld outweighs potential cardiac risk, so he would like to have the Evusheld.  Encouraged him to continue with COVID vaccines as per CDC guidance, including 4th dose 5 months after 3rd dose.      Denny Peon and [place], he is interested in [employer]. Thanks

## 2020-11-22 ENCOUNTER — Telehealth: Payer: Self-pay

## 2020-11-22 NOTE — Telephone Encounter (Signed)
[name] is still interested and comfortable coming in for treatment.  I completed the COVID-19 screen, patient confirmed that they have not had any exposure and does not have any symptoms.  I explained that we are taking extra precautions and using some social distancing practices and patient knows to call if they start to develop symptoms or learns that they have come in contact with a confirmed case.    Answered the following questions:   Have you recently had a Covid vaccine or are you planning on getting one soon? y  Do you feel like you could have an infection or are you taking antibiotics for an infection? n    Confirmed with patient that they have had no insurance changes

## 2020-11-25 ENCOUNTER — Other Ambulatory Visit: Payer: Self-pay | Admitting: Neurology

## 2020-11-25 ENCOUNTER — Telehealth: Payer: Self-pay

## 2020-11-25 ENCOUNTER — Other Ambulatory Visit: Payer: Self-pay

## 2020-11-25 ENCOUNTER — Ambulatory Visit: Payer: Medicare (Managed Care) | Attending: Neurology

## 2020-11-25 ENCOUNTER — Ambulatory Visit: Payer: Medicare (Managed Care)

## 2020-11-25 VITALS — BP 141/84 | HR 62 | Temp 98.6°F | Resp 16

## 2020-11-25 VITALS — BP 125/80 | HR 87 | Temp 97.9°F | Resp 16

## 2020-11-25 DIAGNOSIS — D849 Immunodeficiency, unspecified: Secondary | ICD-10-CM | POA: Insufficient documentation

## 2020-11-25 DIAGNOSIS — Z298 Encounter for other specified prophylactic measures: Secondary | ICD-10-CM | POA: Insufficient documentation

## 2020-11-25 DIAGNOSIS — G35 Multiple sclerosis: Secondary | ICD-10-CM

## 2020-11-25 DIAGNOSIS — Z23 Encounter for immunization: Secondary | ICD-10-CM | POA: Insufficient documentation

## 2020-11-25 LAB — IGG: IgG: 764 mg/dL (ref 700–1600)

## 2020-11-25 LAB — CBC AND DIFFERENTIAL
Baso # K/uL: 0 10*3/uL (ref 0.0–0.1)
Basophil %: 0.7 %
Eos # K/uL: 0.1 10*3/uL (ref 0.0–0.5)
Eosinophil %: 1.5 %
Hematocrit: 45 % (ref 40–51)
Hemoglobin: 15.5 g/dL (ref 13.7–17.5)
IMM Granulocytes #: 0 10*3/uL (ref 0.0–0.0)
IMM Granulocytes: 0.7 %
Lymph # K/uL: 1.2 10*3/uL — ABNORMAL LOW (ref 1.3–3.6)
Lymphocyte %: 20.7 %
MCH: 30 pg (ref 26–32)
MCHC: 35 g/dL (ref 32–37)
MCV: 88 fL (ref 79–92)
Mono # K/uL: 0.6 10*3/uL (ref 0.3–0.8)
Monocyte %: 10.4 %
Neut # K/uL: 3.9 10*3/uL (ref 1.8–5.4)
Nucl RBC # K/uL: 0 10*3/uL (ref 0.0–0.0)
Nucl RBC %: 0 /100 WBC (ref 0.0–0.2)
Platelets: 195 10*3/uL (ref 150–330)
RBC: 5.1 MIL/uL (ref 4.6–6.1)
RDW: 12.5 % (ref 11.6–14.4)
Seg Neut %: 66 %
WBC: 5.9 10*3/uL (ref 4.2–9.1)

## 2020-11-25 LAB — COMPREHENSIVE METABOLIC PANEL
ALT: 22 U/L (ref 0–50)
AST: 29 U/L (ref 0–50)
Albumin: 4.4 g/dL (ref 3.5–5.2)
Alk Phos: 73 U/L (ref 40–130)
Anion Gap: 10 (ref 7–16)
Bilirubin,Total: 0.7 mg/dL (ref 0.0–1.2)
CO2: 27 mmol/L (ref 20–28)
Calcium: 9.4 mg/dL (ref 8.6–10.2)
Chloride: 103 mmol/L (ref 96–108)
Creatinine: 1.07 mg/dL (ref 0.67–1.17)
GFR,Black: 80 *
GFR,Caucasian: 70 *
Glucose: 87 mg/dL (ref 60–99)
Lab: 18 mg/dL (ref 6–20)
Potassium: 4 mmol/L (ref 3.3–5.1)
Sodium: 140 mmol/L (ref 133–145)
Total Protein: 6.5 g/dL (ref 6.3–7.7)

## 2020-11-25 LAB — LYMPHOCYTE SUBSET (T & B CELLS)
B LYM #(CD19): 3 cells/uL — ABNORMAL LOW (ref 120–725)
B LYM %(CD19): 0 % — ABNORMAL LOW (ref 7–36)
CD4#: 575 cells/uL (ref 496–2186)
CD4%: 49 % (ref 32–71)
CD4/CD8: 3.2 — ABNORMAL HIGH (ref 0.7–3.0)
CD8#: 181 cells/uL (ref 177–1137)
NK LYM #(CD16+56): 340 cells/uL (ref 37–758)
NK LYM %(CD16+56): 32 % — ABNORMAL HIGH (ref 4–26)
NK LYM# (CD3+16+56+): 20 cells/uL
NK LYM% (CD3+16+56+): 2 %
T LYM #(CD3): 734 cells/uL — ABNORMAL LOW (ref 754–2810)
T LYM %(CD3): 65 % (ref 54–87)
T Suppress %(CD8): 16 % (ref 10–38)

## 2020-11-25 LAB — IGA: IgA: 198 mg/dL (ref 70–400)

## 2020-11-25 LAB — IGM: IgM: 237 mg/dL — ABNORMAL HIGH (ref 40–230)

## 2020-11-25 MED ORDER — SODIUM CHLORIDE 0.9 % IV SOLN WRAPPED *I*
50.0000 mL/h | Status: DC | PRN
Start: 2020-11-25 — End: 2020-11-25
  Administered 2020-11-25 (×7): 50 mL/h
  Administered 2020-11-25: 50 mL/h via INTRAVENOUS
  Administered 2020-11-25 (×8): 50 mL/h

## 2020-11-25 MED ORDER — SODIUM CHLORIDE 0.9 % IV SOLN WRAPPED *I*
600.0000 mg | Freq: Once | Status: AC
Start: 2020-11-25 — End: 2020-11-25
  Administered 2020-11-25: 600 mg via INTRAVENOUS
  Filled 2020-11-25: qty 20

## 2020-11-25 MED ORDER — METHYLPREDNISOLONE SOD SUCC 125 MG IJ SOLR(62.5MG/ML) *WRAPPED*
125.0000 mg | Freq: Once | INTRAMUSCULAR | Status: AC
Start: 2020-11-25 — End: 2020-11-25
  Administered 2020-11-25: 125 mg via INTRAVENOUS

## 2020-11-25 MED ORDER — TIXAGEVIMAB & CILGAVIMAB 150-150 MG/1.5 ML IV SOLN *I*
3.0000 mL | Freq: Once | INTRAMUSCULAR | Status: AC
Start: 2020-11-25 — End: 2020-11-25
  Administered 2020-11-25: 3 mL via INTRAMUSCULAR

## 2020-11-25 MED ORDER — DIPHENHYDRAMINE HCL 25 MG ORAL SOLID *WRAPPED*
25.0000 mg | Freq: Once | ORAL | Status: AC
Start: 2020-11-25 — End: 2020-11-25
  Administered 2020-11-25: 25 mg via ORAL

## 2020-11-25 MED ORDER — ACETAMINOPHEN 325 MG PO TABS *I*
975.0000 mg | ORAL_TABLET | Freq: Once | ORAL | Status: AC
Start: 2020-11-25 — End: 2020-11-25
  Administered 2020-11-25: 975 mg via ORAL

## 2020-11-25 MED ORDER — CETIRIZINE HCL 10 MG PO TABS *I*
10.0000 mg | ORAL_TABLET | Freq: Once | ORAL | Status: AC
Start: 2020-11-25 — End: 2020-11-25
  Administered 2020-11-25: 10 mg via ORAL

## 2020-11-25 NOTE — Telephone Encounter (Signed)
Scheduled for Ocrevus 05/26/2021

## 2020-11-25 NOTE — Progress Notes (Signed)
Pt arrives for Ocrevus infusion # 3 . Denies recent illness, infection or fever. Pre medicated as ordered. Ocrevus 600 mg  infused per protocol. Pt tolerated infusion well, and was discharged home stable. After visit summary printed and reviewed with patient. Verbalizes understanding.

## 2020-11-25 NOTE — Progress Notes (Signed)
Pt arrives for EVUSHELD injections for pre-exposure prophylaxis for prevention of COVID-19 caused by SARS-CoV-2 Virus.  Patient denies recent illness, infection or fever.   Pt was given cilgavimab 150 mg/1.5 ml in L gluetus medius and tixagevimab 150 mg/1.5 ml in R gluteus medius.  Pt tolerated procedure.

## 2020-12-19 ENCOUNTER — Telehealth: Payer: Self-pay | Admitting: Primary Care

## 2020-12-19 NOTE — Telephone Encounter (Signed)
Called back and rescheduled with Lauren.

## 2020-12-19 NOTE — Telephone Encounter (Signed)
Left message to get [name]'s physical re-scheduled.

## 2021-01-23 ENCOUNTER — Other Ambulatory Visit: Payer: Self-pay | Admitting: Gastroenterology

## 2021-01-28 ENCOUNTER — Ambulatory Visit: Payer: Medicare (Managed Care) | Attending: Neurology | Admitting: Neurology

## 2021-01-28 ENCOUNTER — Encounter: Payer: Self-pay | Admitting: Neurology

## 2021-01-28 VITALS — BP 144/83 | HR 59 | Temp 97.9°F

## 2021-01-28 DIAGNOSIS — G35 Multiple sclerosis: Secondary | ICD-10-CM | POA: Insufficient documentation

## 2021-01-28 NOTE — Progress Notes (Signed)
Multiple Sclerosis Clinic Follow-up Visit    Subjective:  [name] is a 70 y.o. 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    Gait is ok. He holds on when going down stairs. No longer able to play tennis and pickleball, which is hard. Will restart golf this week.  Ampyra use is ongoing.    +Hand/foot tingling at baseline.    Urologist follow-up, will be seeing Dr Broadus John.    Ocrevus last infused 2/22, tolerated well.    Moderna COVID vaccines x4. Had Evusheld. Masking.    Will have PCP visit and Cardiology follow-up.    Use of rowing machine and doing strengthening exercises regularly.    MS History:  Clinical presentations:   1990's R arm weakness   Later numbness hands and feet and stereotyped abnormal distal R thigh sensation  MRIs:   4/08 brain-relatively mild lesion burden, 1 enhancing lesion at the time   5/13 brain-no new lesions   11/14 brain-unchanged   10/16 brain-unchanged   4/18 brain-unchanged                 Thoracic-few, non-enhancing intramedullary lesions   12/20 brain-unchanged                  Thoracic cord- at least 3 new intramedullary lesions (no contrast given)   6/21 brain and thoracic cord-unchanged   6/22 brain-ordered  Other testing:   12/20 JC index 0.33, indeterminate to negative   6/21 natalizumab antibody +   2/22 B cells 3, Cr 1.07  Disease modifying treatment hx:   Betaseron started in 1990's, stopped due to injection site reactions/infections   Tecfidera started 5/13, stopped due to new thoracic cord lesions   1/21 Tysabri started, stopped due to +natalizumab antibody   Ocrevus started 7/21 and continued through the present  Symptomatic treatment hx:   ED-Cialis helps   Foot cramps-baclofen can be helpful   Stereotyped R thigh abnormal sensation-less bothersome, not certain if  Tegretol helped   Knee pain-acupuncture   Gait-Ampyra, gait worsened when it was stopped. PT less helpful in the past. 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 Has 3 children. Never smoker.       Meds:  Current Outpatient Medications on File Prior to Visit   Medication Sig Dispense Refill    Ocrelizumab (OCREVUS IV) Administer into the vein      dalfampridine (AMPYRA) 10 MG tablet TAKE 1 TABLET TWICE A DAY 60 tablet 11  tadalafil (CIALIS) 5 MG tablet TAKE 1 TABLET BY MOUTH EVERY DAY 90 tablet 1    cholecalciferol (VITAMIN D) 1000 UNIT tablet Take 1,000 Units by mouth daily      Cyanocobalamin (VITAMIN B-12 CR PO) Take 1,000 mcg by mouth daily          No current facility-administered medications on file prior to visit.        ROS:  10+ROS reviewed. Pertinent positives and negatives in HPI.     Vitals  Blood pressure 144/83, pulse 59, temperature 36.6 C (97.9 F).    PE:  Gen: Well appearing, NAD.  MS: Alert. Fluent. Affect appropriate for situation.  CN:   Choppy pursuits. Full facial strength. No dysarthria.  Motor: No pronator drift. No abnormal movements. FE 5/5, HF 4/5, ADF 4/5.  Coord: No ataxia on FTN. Finger taps normal.  Gait: Mildly wide-based spastic gait,somewhat imbalanced. 25 ft walk 4.09sec  without assist device.    Assesment and Plan:  70 y.o. M with progressive MS with activity without  relapse, but with some gait impairment which prevents him from doing activities like tennis. Tolerates Ocrevus, so will be continues as DMT.    Ongoing exercise and use of Ampyra for symptomatic tx.    MRI brain 6/22.    FUA 6 months.      Kaede Clendenen, MD  Neuroimmunology      24 minutes spent with patient (>50% spent in counseling and coordination of care), in review of the chart on the same day as the visit.

## 2021-01-28 NOTE — Patient Instructions (Signed)
Please reach out to the clinic with any additional questions or concerns. We encourage you to sign up and use MyChart for any non-urgent medical questions as MyChart is the preferred method of communication.  Please contact your pharmacy for medication refills and allow up to 2 business days for routine prescription refills.  For urgent messages, please call the clinic at [phone].  The clinic is open from 8am to 4:30pm Monday through Friday.      [facility] Find a PCP: [phone]

## 2021-01-31 ENCOUNTER — Ambulatory Visit: Payer: Medicare (Managed Care) | Admitting: Urology

## 2021-02-06 ENCOUNTER — Encounter: Payer: Medicare (Managed Care) | Admitting: Primary Care

## 2021-02-07 ENCOUNTER — Encounter: Payer: Medicare (Managed Care) | Admitting: Medical

## 2021-02-12 ENCOUNTER — Encounter: Payer: Self-pay | Admitting: Gastroenterology

## 2021-02-16 ENCOUNTER — Other Ambulatory Visit: Payer: Self-pay | Admitting: Urology

## 2021-02-17 ENCOUNTER — Other Ambulatory Visit: Payer: Self-pay | Admitting: Urology

## 2021-02-17 DIAGNOSIS — R3989 Other symptoms and signs involving the genitourinary system: Secondary | ICD-10-CM

## 2021-02-18 ENCOUNTER — Encounter: Payer: Self-pay | Admitting: Urology

## 2021-02-18 ENCOUNTER — Telehealth: Payer: Self-pay | Admitting: Urology

## 2021-02-18 ENCOUNTER — Ambulatory Visit: Payer: Medicare (Managed Care) | Admitting: Urology

## 2021-02-18 VITALS — Ht 70.0 in | Wt 165.0 lb

## 2021-02-18 DIAGNOSIS — N529 Male erectile dysfunction, unspecified: Secondary | ICD-10-CM

## 2021-02-18 NOTE — Progress Notes (Signed)
[facility] New Patient Visit    Date: 02/18/2021  [name]    07/05/1951  70 y.o.  Referring provider : No ref. provider found    Chief Complaint:  ED     HPI:   70 year old male reports the following:     Currently taking 5 mg cialis daily.   He has tried sildenafil  In the past.  Sounds like max dose     All of the above no benefit.     Looking at next options.         Medications:   Current Outpatient Medications:     tadalafil (CIALIS) 5 MG tablet, TAKE 1 TABLET BY MOUTH EVERY DAY, Disp: 30 tablet, Rfl: 1    Ocrelizumab (OCREVUS IV), Administer into the vein, Disp: , Rfl:     dalfampridine (AMPYRA) 10 MG tablet, TAKE 1 TABLET TWICE A DAY, Disp: 60 tablet, Rfl: 11    cholecalciferol (VITAMIN D) 1000 UNIT tablet, Take 1,000 Units by mouth daily, Disp: , Rfl:     Cyanocobalamin (VITAMIN B-12 CR PO), Take 1,000 mcg by mouth daily   , Disp: , Rfl:   Allergies: Environmental allergies and Environmental [mold]    Review of Systems :   REVIEW of SYSTEMS  Constitutional: Negative.  HEENT: Negative  Respiratory: Negative  Cardiac: Negative  GI: Negative for constipation.  GU: See above.  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      Enlarged prostate with lower urinary tract symptoms (LUTS) 01/26/2017    Environmental allergies 05.25.18    Extrinsic allergic asthma 11/28/2012    Drenda Freeze follows, usually on max therapy in anticipation of bad ragweed season.     HLD (hyperlipidemia)     Hyperlipidemia 05/30/2008    Normal TG, just normal HDL, and LDL as high as 150's at times. No premature CVD in family,  mother with CAD late. Started statin in 2014, supplementing with coenzyme Q10 to minimize muscle side effects.     Incomplete Right Bundle Branch Block 05/30/2008          Multiple sclerosis     Osteoarthritis of right knee 02/01/2014    Psoriasis     Thoracic aortic aneurysm (TAA) 02/22/2009    mild- 4.0 cm  by CT scan 5/10 See 6/13 [facility] describing no change, plan for 24 month f/u See [id number] [facility] note. See 04/06/16 echo ([facility]) mild.     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   reports that he has never smoked. He has never used smokeless tobacco. He reports current drug use. Frequency: 2.00 times per week. Drug: Marijuana. He reports that he does not drink alcohol.    Physical Exam:   Vitals:    02/18/21 1056   Weight: 74.8 kg (165 lb)   Height: 1.778 m (5\' 10" )     General : Normal  Neurologic: Oriented to person,place, time  Psychiatric : Normal mood and affect  Skin : Normal color, turgor, texture, hydration  Neck : Supple, no JVD, normal thyroid  Respiratory : normal respiratory effort, no rales, ronchi, or wheezes        Results for [name], [name] (MRN [name]) as of 02/18/2021 11:38   Ref. Range 11/17/2012 08:22 10/19/2016 08:32 11/15/2017 08:46 05/19/2019 08:03 03/15/2020 14:29   PSA (eff. 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     Plan :     Follow- up to provide info aobut vacuum erection device (VED)      Discussed various treatment  options for ED, including proper use of oral medications, penile injection therapy, urethral suppositories, vacuum erection device, and inflatable penile implant.        He will continue with his Cialis.     RV 4-6 weeks.     05/15/2020. Gerald Blow, MD, MSc   Assistant Professor of Urology   Department of Urology     Answers for HPI/ROS submitted by the patient on 02/17/2021  Fever: No  Chills: No  Weight loss: No  Malaise/Fatigue: No  Diaphoresis (sweating): No  Chest pain: No  Palpitations: No  Leg swelling: No  Cough: No  Shortness of breath: No  Wheezing: No  Nausea: No  Vomiting: No  Abdominal pain: No  Diarrhea: No  Constipation: No  Dysuria (painful urination): No  Urgency: No  Frequency: No  Hematuria (blood in urine): No  Bladder incontinence: No  Urinary retention: No  Flank pain: No  Back pain: No  Sensory change: No  Weakness: No  Progression since Onset?  : gradually worsening  Have you seen a urologist?  : Yes, for this condition  Have you ever needed a catheter before?: no  Smoking history?  : no  What is your goal for your visit? : Improve Ed  How would you rate your pain level? (0 = no pain; 10 = worst pain): 0/10  What prior testing have you had for this problem?: no prior workup  Was there any improvement after the treatment you tried?  : no improvement

## 2021-02-18 NOTE — Telephone Encounter (Signed)
[name]     Patient is interested in using a vacuum erection device (VED)    I have forgotten the process and medical device contacts we have at this point regarding a vacuum erection device (VED)  .     Can you please follow-up with the patient and direct him how to get a vacuum erection device (VED). Please complete whatever paperwork we have or refer him to any medical device company we work with     Thanks

## 2021-02-19 ENCOUNTER — Encounter: Payer: Self-pay | Admitting: Urology

## 2021-02-19 NOTE — Telephone Encounter (Signed)
Sent Mychart message.

## 2021-02-24 ENCOUNTER — Ambulatory Visit: Payer: Medicare (Managed Care) | Admitting: Medical

## 2021-02-24 ENCOUNTER — Encounter: Payer: Self-pay | Admitting: Medical

## 2021-02-24 VITALS — BP 112/58 | HR 55 | Temp 97.1°F | Wt 174.3 lb

## 2021-02-24 DIAGNOSIS — N529 Male erectile dysfunction, unspecified: Secondary | ICD-10-CM

## 2021-02-24 DIAGNOSIS — G35 Multiple sclerosis: Secondary | ICD-10-CM

## 2021-02-24 DIAGNOSIS — I77810 Thoracic aortic ectasia: Secondary | ICD-10-CM

## 2021-02-24 DIAGNOSIS — E785 Hyperlipidemia, unspecified: Secondary | ICD-10-CM

## 2021-02-24 DIAGNOSIS — Z0000 Encounter for general adult medical examination without abnormal findings: Secondary | ICD-10-CM

## 2021-02-24 NOTE — Progress Notes (Signed)
History and Physical    History  Chief Complaint   Patient presents with    Annual Exam         History of Present Illness:    Now 70 y.o., here for [place].  See Problem List annotations for updates.    [name] presents the office today for annual physical exam.  He has no acute concerns today.    ED-switch from Dr. Alvina Filbert to Dr. Cletus Gash for urology.  He has been unsuccessful trials of tadalafil and sildenafil.  Plan is to try vacuum erection device.  Denies any significant urinary issues.    MS- progressive MS with activity.  Continues to follow closely with neurology, Dr. Morton Peters.  No longer able to play tennis or pickle ball but is still able to golf.  Continues on Ampyra and with recently newer addition of Ocrevus infusions every 6 months.  Overall activity and muscular function remained stable.    Dilated ascending aorta:  - TTE 2019 4.3 cm  - TTE 2020 4.2 cm  - TTE 01/2021 4.0 cm  Dilated aortic root:  - TTE 2019 4.3 cm  - TTE 2020 4.2 cm  - TTE 01/2021 4.3 cm  - Both monitored with annual echos  - Follows with cardiology Orvan July, NP and Dr. Pricilla Riffle    Hyperlipidemia-last blood panel at goal.  Not on a statin due to potential side effect of myalgias progressing MS disease.  Managing with activity and diet currently.      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     HLD (hyperlipidemia)     Hyperlipidemia 05/30/2008    Normal TG, just normal HDL, and LDL as high as 150's at times. No premature CVD in family, mother with CAD late. Started statin in 2014, supplementing with coenzyme Q10 to minimize muscle side effects.     Incomplete Right Bundle Branch Block 05/30/2008          Multiple sclerosis     Osteoarthritis of right knee 02/01/2014    Psoriasis     S/P R TKA 07/31/2019 07/31/2019    Thoracic aortic aneurysm (TAA) 02/22/2009    mild- 4.0 cm  by CT scan 5/10 See 6/13 echo at [facility] describing no change, plan for 24 month f/u See [id number] [facility] note. See 04/06/16 echo ([facility]) mild.     Tic disorder        Surgical History:  Past Surgical History:   Procedure Laterality Date    COLONOSCOPY      HERNIA REPAIR      HX TONSILLECTOMY/ADENOIDECTOMY      INCISIONAL HERNIA REPAIR      KNEE ARTHROSCOPY Right     OTHER SURGICAL HISTORY      MS    PR TOTAL KNEE ARTHROPLASTY Right 07/31/2019    Procedure: RIGHT TOTAL KNEE ARTHROPLASTY;  Surgeon: Arley Phenix, MD;  Location: [facility] MAIN OR;  Service: Orthopedics    VASECTOMY         Allergies:    No Known Allergies (drug, envir, food or latex)    Current medications:     Current Outpatient Medications:     ocrelizumab (OCREVUS) 300 MG/10ML injection, Administer into the vein every 6 months, Disp: , Rfl:     ascorbic acid (VITAMIN C) 100 MG tablet, Take 100 mg by mouth daily, Disp: , Rfl:     tadalafil (CIALIS) 5 MG tablet, TAKE 1 TABLET BY MOUTH EVERY DAY, Disp: 30 tablet, Rfl: 1    dalfampridine (AMPYRA) 10 MG tablet, TAKE 1 TABLET TWICE A DAY, Disp: 60 tablet, Rfl: 11     cholecalciferol (VITAMIN D) 1000 UNIT tablet, Take 1,000 Units by mouth daily, Disp: , Rfl:     Cyanocobalamin (VITAMIN B-12 CR PO), Take 1,000 mcg by mouth daily   , Disp: , Rfl:   Scheduled Meds:  Continuous Infusions:  PRN Meds:.   No medication comments found.    I reviewed the medication list with the patient in eRecord today and changes were not made.     Family History:   Family History   Problem Relation Age of Onset    Heart Disease Mother         coronary art disease, not premature    Cancer Father         multiple myeloma    Cancer Maternal Grandmother         unknown    Heart Disease Brother         aneurism-07-09-18    Multiple Sclerosis Neg Hx     Lupus Neg Hx     Rheum arthritis Neg Hx     Thyroid disease Neg Hx     Diabetes Neg Hx     Colon cancer Neg Hx     Colon polyps Neg Hx     High Blood Pressure Neg Hx        Social/Occupational History:   Social History     Socioeconomic History    Marital status: Married     Spouse name: Not on file    Number of children: Not on file    Years of education: Not on file    Highest education level: Not on file   Occupational History    Not on file   Tobacco Use    Smoking status: Never Smoker    Smokeless tobacco: Never Used   Substance and Sexual Activity    Alcohol use: No     Comment: Very occasionally    Drug use: Yes     Frequency: 2.0 times per week     Types: Marijuana    Sexual activity: Never   Social History Programmer, 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        Exercise -- doubles tennis or pickle ball up to 5 times weekly, joined [employer] and plans to add some resistance  machines.        Diet -- varied, more lean proteins, complex carbs, not a lot of dessert, some greens.  Protein shakes at bkfst and lunch, flax, seeds, yogurt.        Sleep -- active dreams, variable sleep success.  No GU interruption.        Safety -- seatbelt and no distractions, smoke detectors, sunglasses, plans to be more consistent  with skin block.         I updated the patient's personal, family, and social history today as documented in eRecord.    Review of Systems:    Constitutional: Negative for fevers (unexplained or long-lasting), unintentional weight loss, malaise.  No pertinent positives.  HEENT: Negative for change in vision, mouth sores, nasal congestion.  No pertinent positives.  Cardiac: Negative for chest pain, heart disease.  No pertinent positives.  Respiratory: Negative for shortness of breath, chronic cough, wheezing.  No pertinent positives.  Gastrointestinal: Negative for abdominal pain, change in bowels or reflux.  Genitourinary: Negative for dysuria, change in urinary frequency, hematuria.  No pertinent positives.  Musculoskeletal: Negative for joint pain, back pain, muscle pain.  No pertinent positives.  Skin: Negative for skin rashes, skin lesions.  No pertinent positives.  Neurologic: Negative for headaches, seizures, loss of milestones.  Positive for slightly abnormal gait.  Psychological: Negative for depression, anxiety.  No pertinent positives.  Hematologic: Negative for easy bleeding, swollen lymph node(s), unusual bruising.  No pertinent positives.  Allergic/Immunologic: Negative for hay fever.  No pertinent positives.        Physical exam:  Vitals:    02/24/21 0909   BP: 112/58   Pulse: 55   Temp: 36.2 C (97.1 F)   Weight: 79.1 kg (174 lb 4.8 oz)     Body mass index is 25.01 kg/m.    General appearance:  Normal habitus.  Well developed, well groomed.  Appears stated age.  No acute distress.  Color good.  Head:  Normocephalic.  Ears:  External ear without scars, masses, lesions.  External auditory canal intact, clear, and without lesions.  TMs intact with normal light reflex and landmarks.  Acuity to conversational tones good.  Eyes:  PERRL, EOMI.  Lids without defect, conjunctiva and sclera appear normal.  Fundi grossly normal.  Nose:  Nasal mucosa and turbinates pink, septum midline, no lesions.  Mouth:   Teeth in good repair.  Gums pink without lesions.  Normal appearing mucosa, palate, and tongue.  Oropharynx:  Moist without exudate, erythema, or swelling.  Neck:  Symmetric, trachea midline.  Thyroid nontender without enlargement or masses.  Carotids with no bruits.  No cervical lymphadenopathy.  Chest:  Chest wall symmetric without masses.  Breath sounds clear bilaterally without wheezes, rubs, rails, or rhonchi.  CV:  Normal S1 and S2 without murmur, rub, gallop, or click.  Abdomen:  Abdomen soft with normal bowel sounds.  No guarding or rebound.  No palpable masses or tenderness.  Liver without tenderness or enlargement.  No aortic widening.  No inguinal adenopathy.  GU/rectal:  Deferred today per patient preference   Musculoskeletal:  Muscle tone and strength normal for age and MS disease, without atrophy or abnormal movements. Joints with full range of motion, without tenderness, crepitus, or contracture.  No obvious joint deformities or effusions.  Extremities:  No edema, clubbing, or cyanosis.  Dorsalis pedis pulses full and symmetrical.   Skin:  Skin color and turgor normal.  No suspicious lesions, rashes, masses, or ulcerations.  Neurological:  Cranial nerves II through XII intact.  Motor strength symmetrical without obvious weakness.  Superficial sensation intact to light touch.  Observed dexterity without ataxia or tremor.  Mildly wide-based gait with variable spasticity.  Psychiatric:  Appears alert and oriented.  Affect appropriate.      Labs/Diagnostics:  These are the most recent labs:    No visits with results within 30 Day(s) from this visit.   Latest known visit with results is:   Clinical Support on 11/25/2020   Component Date Value Ref Range Status    IgG 11/25/2020 764  700 - 1,600 mg/dL Final    IgM 11/25/2020 237 (!) 40 - 230 mg/dL Final    IgA 11/25/2020 198  70 - 400 mg/dL Final    T LYM %(CD3) 11/25/2020 65  54 - 87 % Final    T LYM #(CD3) 11/25/2020 734 (!) 754 - 2,810 cells/uL Final     CD4% 11/25/2020 49  32 - 71 % Final    CD4# 11/25/2020 575  496 - 2,186 cells/uL Final    T Suppress %(CD8) 11/25/2020 16  10 - 38 % Final    CD8# 11/25/2020 181  177 - 1,137 cells/uL Final    CD4/CD8 11/25/2020 3.2 (!) 0.7 - 3.0 Final    B LYM %(CD19) 11/25/2020 0 (!) 7 - 36 % Final    B LYM #(CD19) 11/25/2020 3 (!) 120 - 725 cells/uL Final    NK LYM %(CD16+56) 11/25/2020 32 (!) 4 - 26 % Final    NK LYM #(CD16+56) 11/25/2020 340  37 - 758 cells/uL Final    NK LYM% (CD3+16+56+) 11/25/2020 2  % Final    NK LYM# (ZO1+09+60+) 11/25/2020 20  cells/uL Final    Interp,LMH 11/25/2020 see message   Final    Comment: Reference Range Note:  Pediatric reference values (0-16 years) taken from  [facility], Number 3.  Adult ranges developed in-house in October, 2002.      Sodium 11/25/2020 140  133 - 145 mmol/L Final    Potassium 11/25/2020 4.0  3.3 - 5.1 mmol/L Final    Chloride 11/25/2020 103  96 - 108 mmol/L Final    CO2 11/25/2020 27  20 - 28 mmol/L Final    Anion Gap 11/25/2020 10  7 - 16 Final    UN 11/25/2020 18  6 - 20 mg/dL Final    Creatinine 11/25/2020 1.07  0.67 - 1.17 mg/dL Final    GFR,Caucasian 11/25/2020 70  * Final    GFR,Black 11/25/2020 80  * Final    *UNITS=mL/min/1.73 square meters    Glucose 11/25/2020 87  60 - 99 mg/dL Final    Comment: Reference Ranges apply only to FASTING samples.    ADA Guidelines Blood Sugar Levels for Diagnosing Diabetes & Pre-diabetes  Normal: < 100 mg/dL  Impaired Fasting Glucose (IFG): 100-125 mg/dL  Diabetes:  > 126 mg/dL on two different occasions      Calcium 11/25/2020 9.4  8.6 - 10.2 mg/dL Final    Total Protein 11/25/2020 6.5  6.3 - 7.7 g/dL Final    Albumin 11/25/2020 4.4  3.5 - 5.2 g/dL Final    Bilirubin,Total 11/25/2020 0.7  0.0 - 1.2 mg/dL Final    AST 11/25/2020 29  0 - 50 U/L Final    ALT 11/25/2020 22  0 - 50 U/L Final    Alk Phos 11/25/2020 73  40 - 130 U/L Final    WBC 11/25/2020  5.9  4.2 - 9.1 THOU/uL Final     RBC 11/25/2020 5.1  4.6 - 6.1 MIL/uL Final    Hemoglobin 11/25/2020 15.5  13.7 - 17.5 g/dL Final    Hematocrit 11/25/2020 45  40 - 51 % Final    MCV 11/25/2020 88  79 - 92 fL Final    MCH 11/25/2020 30  26 - 32 pg Final    MCHC 11/25/2020 35  32 - 37 g/dL Final    RDW 11/25/2020 12.5  11.6 - 14.4 % Final    Platelets 11/25/2020 195  150 - 330 THOU/uL Final    Seg Neut % 11/25/2020 66.0  % Final    Lymphocyte % 11/25/2020 20.7  % Final    Monocyte % 11/25/2020 10.4  % Final    Eosinophil % 11/25/2020 1.5  % Final    Basophil % 11/25/2020 0.7  % Final    Neut # K/uL 11/25/2020 3.9  1.8 - 5.4 THOU/uL Final    Lymph # K/uL 11/25/2020 1.2 (!) 1.3 - 3.6 THOU/uL Final    Mono # K/uL 11/25/2020 0.6  0.3 - 0.8 THOU/uL Final    Eos # K/uL 11/25/2020 0.1  0.0 - 0.5 THOU/uL Final    Baso # K/uL 11/25/2020 0.0  0.0 - 0.1 THOU/uL Final    Nucl RBC % 11/25/2020 0.0  0.0 - 0.2 /100 WBC Final    Nucl RBC # K/uL 11/25/2020 0.0  0.0 - 0.0 THOU/uL Final    IMM Granulocytes # 11/25/2020 0.0  0.0 - 0.0 THOU/uL Final    IMM Granulocytes 11/25/2020 0.7  % Final         Assessment  Problem List Items Addressed This Visit        Primary Care    Hyperlipidemia    Relevant Orders    Lipid Panel (Reflex to Direct  LDL if Triglycerides more than 400)    Erectile dysfunction, unspecified erectile dysfunction type       Other    Multiple sclerosis-Re-enrolled MSPATHS 001, 01/15/20 (Chronic)    Relevant Medications    ocrelizumab (OCREVUS) 300 MG/10ML injection    Ascending aorta dilation      Other Visit Diagnoses     Routine general medical examination at a health care facility    -  Primary          Plan  1.  Annual physical exam-70 year old male with a history of progressive MS, hyperlipidemia, ED who is feeling overall well and healthy today.  Health maintenance topics discussed and up-to-date.    2.  Hyperlipidemia-last lipid panel at goal.  Not on a statin.  Potential side effect with statins may progress MS disease.   We will asked the patient to report to the lab for repeat fasting lipid panel (due in 1 month).  Overall encouraged to continue with activity as tolerated and maintain a healthy diet.    3.  MS- currently stable at this time but remains progressive.  We will continue with neurology follow-up and compliance with Ampyra and Ocrevus.  Encouraged to continue to remain as physically active as able.    4.  Aortic dilation- most recent TTE shows stable disease.  We will continue with annual echocardiograms and cardiology follow-up.    5.  ED-recently switched urology providers.  We will be pursuing vacuum erection device.  Will be discontinuing Cialis.    Counseling and education:  Diet, bodyweight, and exercise discussed  Continued smoking cessation discussed  Alcohol use discussed  Recreational drug use discussed-occasional marijuana use (not daily)  Eye and dental health maintenance exams discussed  Colon cancer screening discussed-up-to-date  Skin cancer awareness and prevention discussed  Cardiac risk factor modification discussed  Domestic or work violence issues: No    Immunization status/to be given today: N/A      Follow-up: As needed  Next CPE: 1 year    Angelie Kram L. [place], PA-C  [facility]

## 2021-02-25 ENCOUNTER — Encounter: Payer: Self-pay | Admitting: Medical

## 2021-02-27 ENCOUNTER — Encounter: Payer: Self-pay | Admitting: Neurology

## 2021-02-28 ENCOUNTER — Other Ambulatory Visit: Payer: Self-pay | Admitting: Neurology

## 2021-02-28 MED ORDER — MECLIZINE HCL 25 MG PO TABS *I*
25.0000 mg | ORAL_TABLET | Freq: Three times a day (TID) | ORAL | 0 refills | Status: DC | PRN
Start: 2021-02-28 — End: 2021-12-01

## 2021-02-28 NOTE — Telephone Encounter (Signed)
Will defer patient's MyChart response to NP pool for recommendations.

## 2021-03-06 ENCOUNTER — Other Ambulatory Visit: Payer: Self-pay | Admitting: Neurology

## 2021-03-06 DIAGNOSIS — G35 Multiple sclerosis: Secondary | ICD-10-CM

## 2021-03-06 MED ORDER — PREDNISONE 10 MG (21) PO TBPK *I*
ORAL_TABLET | ORAL | 0 refills | Status: DC
Start: 2021-03-06 — End: 2021-12-01

## 2021-03-06 MED ORDER — PREDNISONE 50 MG PO TABS *I*
ORAL_TABLET | ORAL | 0 refills | Status: DC
Start: 2021-03-06 — End: 2021-12-01

## 2021-03-12 NOTE — Telephone Encounter (Signed)
Pt scheduled with Dr. Ardelia Mems on 10/25

## 2021-03-22 ENCOUNTER — Other Ambulatory Visit: Payer: Self-pay | Admitting: Adult Health

## 2021-03-22 DIAGNOSIS — G35 Multiple sclerosis: Secondary | ICD-10-CM

## 2021-04-15 ENCOUNTER — Ambulatory Visit: Payer: Medicare (Managed Care) | Admitting: Urology

## 2021-04-16 ENCOUNTER — Other Ambulatory Visit
Admission: RE | Admit: 2021-04-16 | Discharge: 2021-04-16 | Disposition: A | Discharge status: Home / Self Care | Payer: Medicare (Managed Care) | Source: Ambulatory Visit | Attending: Medical | Admitting: Medical

## 2021-04-16 DIAGNOSIS — E785 Hyperlipidemia, unspecified: Secondary | ICD-10-CM | POA: Insufficient documentation

## 2021-04-16 LAB — LIPID PANEL
Chol/HDL Ratio: 3.5
Cholesterol: 211 mg/dL — AB
HDL: 60 mg/dL (ref 40–60)
LDL Calculated: 136 mg/dL — AB
Non HDL Cholesterol: 151 mg/dL
Triglycerides: 75 mg/dL

## 2021-04-27 ENCOUNTER — Other Ambulatory Visit: Payer: Self-pay | Admitting: Urology

## 2021-04-30 ENCOUNTER — Telehealth: Payer: Self-pay | Admitting: Neurology

## 2021-04-30 ENCOUNTER — Telehealth: Payer: Self-pay | Admitting: Primary Care

## 2021-04-30 ENCOUNTER — Encounter: Payer: Self-pay | Admitting: Primary Care

## 2021-04-30 MED ORDER — NIRMATRELVIR & RITONAVIR 150-100 MG PO TABS *I*
ORAL_TABLET | ORAL | 0 refills | Status: AC
Start: 2021-04-30 — End: 2021-05-05

## 2021-04-30 NOTE — Telephone Encounter (Signed)
Call from patient diagnosed with Multiple Sclerosis they tested positive for COVID on 7/20 with an at home test.  Symptoms started on 7/19.    Symptoms to include: Headache, body aches, non-productive cough.    Denies the following symptoms: Nasal & chest congestion, SOB, fevers, BM changes, nausea, vomiting.     Vaccinated x2  Boosted x2    Looking for next steps, interested in Paxlovid.  Please advise.

## 2021-04-30 NOTE — Telephone Encounter (Signed)
Patient calling in stating that he took a COVID test last night and it came back positive. He would like to know if he can have "Paxlovid".    Please advise and call back at [phone]

## 2021-04-30 NOTE — Telephone Encounter (Signed)
Called and spoke with patient.  His symptoms started yesterday, with body aches, headaches, and mild congestion.  Denies fever.  He also has call in to PCP regarding anti-viral treatment.   Discussed we are ok with him receiving Paxlovid and should continue to follow-up with PCP to discuss treatment.  If he has any difficulty connecting with PCP advised he call us back tomorrow when Dr. Ardelia Mems is in the office and she can further advise on treatment recommendations.

## 2021-04-30 NOTE — Telephone Encounter (Signed)
Patient was advised of rebound per my note below.  Thanks Vickey Sages please send this back to me once signed.

## 2021-04-30 NOTE — Telephone Encounter (Signed)
Pharmacy Paxlovid consult:        Lab results: 11/25/20  0938   Sodium 140   Potassium 4.0   Chloride 103   CO2 27   UN 18   Creatinine 1.07   GFR,Caucasian 70   GFR,Black 80   Glucose 87   Calcium 9.4     Pt should receive standard dose nirmatrelvir/ritonavir 150/100 3 total capsules two times daily for 5 days.     Drug interactions:  1. Hold tadalafil for 5 days of treatment and 3 days after - 8 total days    [name] Oyster, PharmD, [facility]  [facility]  [phone]

## 2021-04-30 NOTE — Telephone Encounter (Signed)
I called and spoke with [name], he currently has a headache, body aches, and a dry cough.  He denies fever,chills, shortness of breath, resp distress, n/v/d.  Symptoms started last night 7/19. Tested positive today.  Today is day 1.        He also had Evusheld 5 months ago, as well as 2 COVID booster shots.  He would like paxlovid sent in ASAP for him.  He is aware of the possible side effects and possible rebound COVID.  GFR 80    Sent to Sarah to advise on any interaction with his ampyra, per notes on prednisone he is no longer on that.  Other meds on list are supplements, and every 6 month infusion.

## 2021-04-30 NOTE — Telephone Encounter (Signed)
JB notified patient of tadalafil hold via my chart.  He read and responded.

## 2021-04-30 NOTE — Telephone Encounter (Signed)
Call from Alvis insisting that JB be interrupted because he "can't wait all day for the Paxlovid."  He wants a nurse to call back as soon as possible.

## 2021-05-01 NOTE — Telephone Encounter (Signed)
He received Paxlovid from his PCP's office, so I'll close this encounter.

## 2021-05-11 ENCOUNTER — Encounter: Payer: Self-pay | Admitting: Primary Care

## 2021-05-12 ENCOUNTER — Telehealth: Payer: Self-pay | Admitting: Primary Care

## 2021-05-12 NOTE — Telephone Encounter (Signed)
Amering, Shawn Route, PharmD  You; Ruby Cola, MD 5 minutes ago (11:51 AM)     SA    There are no guidelines regarding re-treatment and it is not generally recommended. Pt should isolate again for 5 days (and no symptoms) and mask for another 5 after that (or continue to quarantine for total of 10 days if still having symptoms).

## 2021-05-12 NOTE — Telephone Encounter (Signed)
I called [name] and left detailed message with this information.  I asked him to be in touch with any questions and/or concerns with his symptoms.

## 2021-05-12 NOTE — Telephone Encounter (Signed)
See call

## 2021-05-12 NOTE — Telephone Encounter (Signed)
I agree; if Paxlovid "rebound" and managing symptoms, then no indication for additional antiviral rx.

## 2021-05-12 NOTE — Telephone Encounter (Signed)
Gerald Dodge, MD 17 hours ago (4:47 PM)           I developed a rebound covid. I have tested positive and have a cough, dry nasal and am very tired. Is it possible to take paxlovid again or are there any other things I can be doing  Thank you

## 2021-05-14 MED ORDER — BENZONATATE 200 MG PO CAPS *I*
200.0000 mg | ORAL_CAPSULE | Freq: Three times a day (TID) | ORAL | 1 refills | Status: DC | PRN
Start: 2021-05-14 — End: 2021-12-01

## 2021-05-14 NOTE — Telephone Encounter (Signed)
[name] called in regarding his rebound Covid - he is dealing with a nasty cough is hoping to speak with a nurse regarding this.

## 2021-05-14 NOTE — Addendum Note (Signed)
Addended by: Arlina Robes on: 05/14/2021 12:35 PM     Modules accepted: Orders

## 2021-05-14 NOTE — Addendum Note (Signed)
Addended by: Jacqualin Combes on: 05/14/2021 01:02 PM     Modules accepted: Orders

## 2021-05-14 NOTE — Telephone Encounter (Signed)
I spoke with [name], he has a dry non productive hacking cough.  He is feeling fatigued as well, but otherwise okay.  He tolerated benzonatate in 2020.  I let him know will send in rx for him.  Please see pended rx.  thanks

## 2021-05-19 ENCOUNTER — Telehealth: Payer: Self-pay

## 2021-05-19 NOTE — Telephone Encounter (Signed)
Need order for ocrevus. NOT on free drug

## 2021-05-20 ENCOUNTER — Other Ambulatory Visit: Payer: Self-pay | Admitting: Neurology

## 2021-05-20 ENCOUNTER — Encounter: Payer: Self-pay | Admitting: Neurology

## 2021-05-21 NOTE — Telephone Encounter (Signed)
Pt tested positive for COVID last week and is continuing to experience fatigue.  Advised pt to rest as much as possible as this is a common symptom after infection.  Will defer to NP pool for any further recommendations.

## 2021-05-23 ENCOUNTER — Telehealth: Payer: Self-pay

## 2021-05-23 NOTE — Telephone Encounter (Addendum)
I called Girard and he still interested and comfortable coming in for treatment.  I completed the COVID-19 screen, patient confirmed that they have not had any exposure and does not have any symptoms.  I explained that we are taking extra precautions and using some social distancing practices and patient knows to call if they start to develop symptoms or learns that they have come in contact with a confirmed case.  Spoke with patient, answered the following questions:   Have you recently had a Covid vaccine or are you planning on getting one soon? no  Do you feel like you could have an infection or are you taking antibiotics for an infection? no   No ins changes

## 2021-05-26 ENCOUNTER — Ambulatory Visit: Payer: Medicare (Managed Care) | Attending: Neurology

## 2021-05-26 VITALS — BP 110/70 | HR 61 | Temp 98.2°F | Resp 16

## 2021-05-26 DIAGNOSIS — G35 Multiple sclerosis: Secondary | ICD-10-CM | POA: Insufficient documentation

## 2021-05-26 LAB — CBC AND DIFFERENTIAL
Baso # K/uL: 0 10*3/uL (ref 0.0–0.1)
Basophil %: 0.5 %
Eos # K/uL: 0.2 10*3/uL (ref 0.0–0.5)
Eosinophil %: 2.9 %
Hematocrit: 43 % (ref 40–51)
Hemoglobin: 14.6 g/dL (ref 13.7–17.5)
IMM Granulocytes #: 0 10*3/uL (ref 0.0–0.0)
IMM Granulocytes: 0.4 %
Lymph # K/uL: 1.4 10*3/uL (ref 1.3–3.6)
Lymphocyte %: 25.1 %
MCH: 30 pg (ref 26–32)
MCHC: 34 g/dL (ref 32–37)
MCV: 88 fL (ref 79–92)
Mono # K/uL: 0.8 10*3/uL (ref 0.3–0.8)
Monocyte %: 14.4 %
Neut # K/uL: 3.2 10*3/uL (ref 1.8–5.4)
Nucl RBC # K/uL: 0 10*3/uL (ref 0.0–0.0)
Nucl RBC %: 0 /100 WBC (ref 0.0–0.2)
Platelets: 194 10*3/uL (ref 150–330)
RBC: 4.8 MIL/uL (ref 4.6–6.1)
RDW: 12.8 % (ref 11.6–14.4)
Seg Neut %: 56.7 %
WBC: 5.6 10*3/uL (ref 4.2–9.1)

## 2021-05-26 LAB — LYMPHOCYTE SUBSET (T & B CELLS)
B LYM #(CD19): 1 cells/uL — ABNORMAL LOW (ref 120–725)
B LYM %(CD19): 0 % — ABNORMAL LOW (ref 7–36)
CD4#: 637 cells/uL (ref 496–2186)
CD4%: 45 % (ref 32–71)
CD4/CD8: 3.3 — ABNORMAL HIGH (ref 0.7–3.0)
CD8#: 193 cells/uL (ref 177–1137)
NK LYM #(CD16+56): 501 cells/uL (ref 37–758)
NK LYM %(CD16+56): 39 % — ABNORMAL HIGH (ref 4–26)
NK LYM# (CD3+16+56+): 25 cells/uL
NK LYM% (CD3+16+56+): 2 %
T LYM #(CD3): 800 cells/uL (ref 754–2810)
T LYM %(CD3): 59 % (ref 54–87)
T Suppress %(CD8): 14 % (ref 10–38)

## 2021-05-26 LAB — COMPREHENSIVE METABOLIC PANEL
ALT: 25 U/L (ref 0–50)
AST: 29 U/L (ref 0–50)
Albumin: 4.4 g/dL (ref 3.5–5.2)
Alk Phos: 87 U/L (ref 40–130)
Anion Gap: 6 — ABNORMAL LOW (ref 7–16)
Bilirubin,Total: 0.8 mg/dL (ref 0.0–1.2)
CO2: 31 mmol/L — ABNORMAL HIGH (ref 20–28)
Calcium: 9.4 mg/dL (ref 8.6–10.2)
Chloride: 104 mmol/L (ref 96–108)
Creatinine: 1.03 mg/dL (ref 0.67–1.17)
Glucose: 75 mg/dL (ref 60–99)
Lab: 17 mg/dL (ref 6–20)
Potassium: 4.3 mmol/L (ref 3.3–5.1)
Sodium: 141 mmol/L (ref 133–145)
Total Protein: 6.3 g/dL (ref 6.3–7.7)
eGFR BY CREAT: 78 *

## 2021-05-26 LAB — IGM: IgM: 196 mg/dL (ref 40–230)

## 2021-05-26 LAB — IGA: IgA: 174 mg/dL (ref 70–400)

## 2021-05-26 LAB — IGG: IgG: 734 mg/dL (ref 700–1600)

## 2021-05-26 MED ORDER — METHYLPREDNISOLONE SOD SUCC 125 MG IJ SOLR(62.5MG/ML) *WRAPPED*
125.0000 mg | Freq: Once | INTRAMUSCULAR | Status: AC
Start: 2021-05-26 — End: 2021-05-26
  Administered 2021-05-26: 125 mg via INTRAVENOUS
  Filled 2021-05-26: qty 2

## 2021-05-26 MED ORDER — ACETAMINOPHEN 325 MG PO TABS *I*
975.0000 mg | ORAL_TABLET | Freq: Once | ORAL | Status: AC
Start: 2021-05-26 — End: 2021-05-26
  Administered 2021-05-26: 975 mg via ORAL
  Filled 2021-05-26: qty 3

## 2021-05-26 MED ORDER — SODIUM CHLORIDE 0.9 % IV SOLN WRAPPED *I*
600.0000 mg | Freq: Once | Status: AC
Start: 2021-05-26 — End: 2021-05-26
  Administered 2021-05-26: 600 mg via INTRAVENOUS
  Filled 2021-05-26: qty 20

## 2021-05-26 MED ORDER — DIPHENHYDRAMINE HCL 25 MG ORAL SOLID *WRAPPED*
25.0000 mg | Freq: Once | ORAL | Status: AC
Start: 2021-05-26 — End: 2021-05-26
  Administered 2021-05-26: 25 mg via ORAL
  Filled 2021-05-26: qty 1

## 2021-05-26 MED ORDER — CETIRIZINE HCL 10 MG PO TABS *I*
10.0000 mg | ORAL_TABLET | Freq: Once | ORAL | Status: AC
Start: 2021-05-26 — End: 2021-05-26
  Administered 2021-05-26: 10 mg via ORAL
  Filled 2021-05-26: qty 1

## 2021-05-26 MED ORDER — SODIUM CHLORIDE 0.9 % IV SOLN WRAPPED *I*
50.0000 mL/h | Status: DC | PRN
Start: 2021-05-26 — End: 2021-05-26
  Administered 2021-05-26 (×2): 50 mL/h
  Administered 2021-05-26: 50 mL/h via INTRAVENOUS
  Administered 2021-05-26 (×5): 50 mL/h

## 2021-05-26 NOTE — Progress Notes (Signed)
Pt arrives for Ocrevus infusion # 4. Denies recent illness, infection or fever. Ocrevus 600 mg infused per rapid protocol. Pt tolerated infusion well, and was discharged home stable.

## 2021-05-26 NOTE — Patient Instructions (Signed)
Please reach out to the clinic with any additional questions or concerns. We encourage you to sign up and use MyChart for any non-urgent medical questions as MyChart is the preferred method of communication.  Please contact your pharmacy for medication refills and allow up to 2 business days for routine prescription refills.  For urgent messages, please call the clinic at [phone].  The clinic is open from 8am to 4:30pm Monday through Friday.        Continue with your usual medications as prescribed.    Ocrevus can increase your risk of respiratory, skin, and herpes infections, please call [phone] with any of the following:    • Fevers/chills  • Cough that will not go away  • Signs and symptoms of respiratory infections  • Cold sores/shingles/genital sores  • Itching/rashes/hives     Side effects can occur up to a week after infusion.  If you experience body aches or low grade fever you can take Tylenol or Ibuprofen as prescribed. If you experience a rash or itching you can take Benadryl or any other allergy medicine you may have as prescribed. Please call the patient line at [phone] if symptoms don't go away.    You may have more chance of getting an infection. Wash hands often. Stay away from people with infections, colds, or flu. Continue to wear your mask out in public. It is highly recommended that you get a COVID-19 vaccine and booster. People who are fully vaccinated on this therapy have gotten COVID-19. If you think you may have COVID-19 get tested right away. If you are positive you may qualify for medications to lessen the severity of COVID-19. There is a limited time frame these medications can be administered so please get tested as soon as possible and let your neurologist know of a positive test result.    You should not receive any live vaccines while on Ocrevus.  Please check with your MD first. Be sure to have regular breast exams. Your doctor will tell you know how often to have  these. You will also need to do breast self-exams as your doctor has told you.  Women should avoid pregnancy while taking this medication. Let your Doctor know right away if you become pregnant.    Schedule a follow-up with your provider as close to your infusion appointment as possible but within 4 -6 months after your infusion.  Contact Neurology Scheduling for assistance at [phone].    If you have an insurance changes, please notify the office at [phone] as benefits must be verified.

## 2021-06-03 ENCOUNTER — Encounter: Payer: Self-pay | Admitting: Neurology

## 2021-06-03 DIAGNOSIS — R252 Cramp and spasm: Secondary | ICD-10-CM

## 2021-06-03 DIAGNOSIS — G35 Multiple sclerosis: Secondary | ICD-10-CM

## 2021-06-03 DIAGNOSIS — R269 Unspecified abnormalities of gait and mobility: Secondary | ICD-10-CM

## 2021-06-04 NOTE — Telephone Encounter (Signed)
Referral for PT at [facility] pended on this encounter, please review and sign if appropriate.

## 2021-06-13 ENCOUNTER — Ambulatory Visit: Payer: Medicare (Managed Care) | Attending: Neurology | Admitting: Physical Therapy

## 2021-06-13 DIAGNOSIS — G35 Multiple sclerosis: Secondary | ICD-10-CM | POA: Insufficient documentation

## 2021-06-13 NOTE — Progress Notes (Signed)
Physical Therapy Daily Flowsheet:  *Please see Physical Therapy Exercise Flowsheet for details regarding exercises completed this session.*     06/13/21 0700   Overview   Diagnosis Multiple Sclerosis   Insurance Medicare Blue Choice   Script Date 06/09/21   Visit # 1   Additional Comments 12 week POC   Pain Assessment   Pain Denies   Functional Outcome Measures   Functional Performance Test Yes   Functional Gait Assessment Yes   Gait Level Surface 2   Change in gait speed 3   Gait with horizontal head turns 2   Gait with vertical head turns 2   Gait and pivot turns 3   Step over obstacle 2   Gait with narrow base of support 0   Gait with eyes closed 2   Ambulating backwards 2   Steps 2   Gait assessment total 20   Gait Speed Yes   Distance(meters) 10   Time(sec) 8.36   Meters/Sec 1.2   Patient Education   Patient Education Yes   Educated in disease process Yes   Educated in home exercise program Yes   Additional Patient Education HEP: walking with head turns (horizontal and vertical), standing marches, tandem stance    Time Calculation   PT Timed Codes 10   PT Untimed Codes 30   PT Unbilled Time 0   PT Total Treatment 40   Alma Friendly PT, DPT

## 2021-06-13 NOTE — Progress Notes (Signed)
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      Enlarged prostate with lower urinary tract symptoms (LUTS) 01/26/2017    Environmental allergies 05.25.18    Extrinsic allergic asthma 11/28/2012    Drenda Freeze follows, usually on max therapy in anticipation of bad ragweed season.     HLD (hyperlipidemia)     Hyperlipidemia 05/30/2008    Normal TG, just normal HDL, and LDL as high as 150's at times. No premature CVD in family, mother with CAD late. Started statin in 2014, supplementing with coenzyme Q10 to minimize muscle side effects.     Incomplete Right Bundle Branch Block 05/30/2008          Multiple sclerosis     Osteoarthritis of right knee 02/01/2014    Psoriasis     S/P R TKA 07/31/2019 07/31/2019    Thoracic aortic aneurysm (TAA) 02/22/2009    mild- 4.0 cm  by CT scan 5/10 See 6/13 echo at [facility] describing no change, plan for 24 month f/u See [id number] note. See 04/06/16 echo ([facility]) mild.     Tic disorder      Past Surgical History:   Procedure Laterality Date    COLONOSCOPY      HERNIA REPAIR      HX TONSILLECTOMY/ADENOIDECTOMY      INCISIONAL HERNIA REPAIR      KNEE ARTHROSCOPY Right     OTHER SURGICAL HISTORY      MS    PR TOTAL KNEE ARTHROPLASTY Right 07/31/2019    Procedure: RIGHT TOTAL KNEE ARTHROPLASTY;  Surgeon: Laurance Flatten, MD;  Location: [facility] MAIN OR;  Service: Orthopedics    VASECTOMY       Referring practitioner: Colin Mulders, NP  Reason for referral: Patient has long history of multiple sclerosis and is now having Increasing weakness and balance   Onset date on symptoms/Date of Surgery: 25  years ago  Previous treatments: Previous PT (2021, 2016)  Previous Functional Level: Independent  Type of home:    Lives:  With wife, he now lives in town home with 2 stories.   Medical equipment in the home: straight cane and walking sticks  Orthotics: None  Recreational activities: previously played tennis and pickleball but has difficulty running now. Plays golf regularly, uses rowing machine  (does about 15 minutes daily), uses resistance bands to stay active. He continues to work as IT trainer.    SUBJECTIVE  Pain:     Denies                Current functional limitations: Patient reports difficulty with stairs, feels unstable. He uses walking sticks when going for longer walk. He denies falls, however states he trips occasionally but is able to catch himself. Trips normally caused by legs not wanting to pick up     Patient Goals for Therapy: Improve balance and feel more stable on his feet    OBJECTIVE  Observation: Patient is a pleasant and cooperative male in NAD.  Cognition: no deficit noted  Vision: Wears corrective lenses  Sensation:  Numbness in bilateral feet  and hands which has been chronic   Coordination: bilateral upper and lower extremities:  intact  Tone: not tested      ROM:       R UE: AROM within functional limits   L UE: AROM within functional limits   R LE: AROM within functional limits   L LE: AROM within functional limits    *Indicates pain    Strength:      Muscle group Right  Left    Shoulder Flexion     Elbow Flexion     Elbow Extension     Wrist Flexion     Wrist Extension     Hip Flexors  4  4   Hip Extensors     Hip Abductors     Hip Adductors     Knee Extensors  5 5   Knee Flexors 5  5   Ankle Dorsiflexors 5 5   Ankle Plantar flexors      Ankle Inverters      Ankle Evertors     Extensor hallucis longus          Functional assessment:         Bed mobility:  Not tested but reports independent      Transfers:  Independent with sit to stand transfers, when not using UE requires excessive anterior  trunk lean to improve ease of transfer     Ambulation:    Patient ambulates independently without assistive device, he displays occasional foot flat contact and periodic catching toes during ambulation trials. Occasional bilaterally knee hyperextension noted     Stairs: Independent with reciprocal pattern with 2 rails. Slow cadence     Balance:  Romberg 30 seconds eyes open and closed  Tandem: unable to achieve tandem stance without UE support to get into position but then is able to maintain ~7 seconds before stepping out of position    Functional Performance Outcome Measures:      Functional Gait Assessment: Yes 20/30  Gait speed: 10 walk test: 1.2 m/s      10 meter Gait speed Cut-off scores   <0.52m/s Likely household ambulator1   0.4-0.10m/s Limited community ambulator (*<0.07 increased risk of falls, hospitalization, and mortality)1   >0.71m/s Community ambulator (1.2-1.96m/s to safely cross street)1   <0.8m/s Indicative of frailty2   <1.82m/s Identifies well-functioning people at high risk of adverse health-related outcomes3   1. Diannia Ruder, Novella Olive., Behrman, Cherly Beach., & Allene Pyo. A. (2008). Validation of a speed-based classification system using quantitative measures of walking performance poststroke. Neurorehabilitation and neural repair, 22(6), [id number]. http://fisher.org/  2. Shelda Jakes, L., & Young, J. (2014). Diagnostic test accuracy of simple instruments for identifying frailty in community-dwelling older people: a systematic review. Age And Ageing, 44(1), 148-152. http://white.info/  3. [place] Donovan, S., Penninx, B., Nicklas, B., Simonsick, E., & Ezzard Standing, A. et al. (2005). Prognostic Value of Usual Gait Speed in Well-Functioning Older PeopleResults from the Health, Aging and Body Composition Study. Journal Of The [employer] and [employer], 53(10), [phone]. https://www.west-esparza.com/      MCID for Geriatrics:  0.1-0.7m/s1, 2    1. Nestor Lewandowsky, R., & Studenski, S. (2006). Meaningful Change and Responsiveness in Common Physical Performance Measures in Older Adults. Journal Of The [employer] and [employer], 54(5), [phone]. http://www.nguyen-hutchinson.com/  2. Bohannon, R., & Glenney, S. (2014). Minimal clinically important difference for change in comfortable gait speed of adults with pathology: a systematic review. Journal Of Evaluation In Clinical  Practice, 20(4), 295-300. http://fox-wallace.com/.21975            Endurance: good     Patient Education:     Patient Education  Patient Education: Yes  Educated in disease process: Yes  Educated in home exercise program: Yes  Additional Patient Education: HEP: walking with head turns (horizontal and vertical), standing marches, tandem stance       ASSESSMENT  [name] is a 70 y.o. male with a history of Diagnosis: Multiple Sclerosis. He currently presents to outpatient physical therapy with deficits in sensation, balance and endurance resulting in difficulty in transfers, ambulation and stair navigation. [place] demonstrates increased instability with addition of balance challenges into his mobility. He is at a fall risk based on his functional outcome measure score and would benefit from skilled PT. Skilled physical therapy services indicated to maximize independence with functional mobility and address goals below.     The following comorbidities may affect treatment/recovery: Cardiac history  Multiple sclerosis (MS) and the following   Personal factors may affect treatment/recovery: none identified.       Clinical presentation:stable    Patient complexity is low level as indicated by above personal factors, environmental factors and comorbidities in addition to their impairments found on physical exam.      Rehab potential/prognosis: good  Patient's understanding: good      Short term goals: 4 weeks  HEP/Family Training Goals:    Patient will be independent with basic home exercise routine.  Outcome Measures Goals:   FGA: Patient will achieve a 22/30 on the Functional Gait Assessment in order to limit fall risk.    Patient will demonstrate ability to maintain tandem stance for 30 seconds bilaterally without UE support  Patient will navigate stairs independently with single UE support and reciprocal pattern    Long Term Goals: 12 weeks  HEP/Family Training Goals:   Patient will be independent with comprehensive home exercise routine.  Outcome Measures Goals:   FGA: Patient will achieve a 27/30 on the Functional Gait Assessment in order to limit fall risk.    Patient will demonstrate normalized mCTSIB testing in all domains  Patient will navigate stairs independently without UE support in a reciprocal pattern.  Patient will demonstrate ability to jog at slow cadence for 5 minutes.        PLAN  Plan of Care: Appropriate for PT  PT interventions: AROM/PROM/Therapeutic exercise, Balance activites, Closed chain activites, Flexibility, Gait training/Functional activities, General conditioning, Home exercise program instruction, Manual therapy, Neuromuscular Re-education, Patient/Family Education, Postural training/body Curator education, Proprioceptive training, Strengthening, Therapeutic Activities  PT frequency:  Once a week  PT duration: 12 weeks            Thank you for the referral.  If you have any questions and/or concerns, please feel free to contact me at [phone].            Alma Friendly PT, DPT                      Department of Physical Medicine & Rehabilitation    PLAN OF CARE    Physician:  Colin Mulders, NP    I have reviewed your initial evaluation and agree with the documented goals and Plan of Care      06/13/2021

## 2021-06-13 NOTE — Progress Notes (Signed)
Physical Therapy Exercise Flowsheet:  *Please refer to Physical Therapy Daily Flowsheet for further details of this session.*     06/13/21 0800   Balance Exercises   additional exercise Patient educated and demonstrates home exercise program including ambulation with horizontal and vertical head turns, standing marches, and tandem stance with light UE support.   Total time 10 ther ex   Alma Friendly PT, DPT

## 2021-06-23 ENCOUNTER — Ambulatory Visit: Payer: Medicare (Managed Care) | Admitting: Physical Therapy

## 2021-06-23 DIAGNOSIS — G35 Multiple sclerosis: Secondary | ICD-10-CM

## 2021-06-23 NOTE — Progress Notes (Signed)
Physical Therapy Exercise Flowsheet:  *Please refer to Physical Therapy Daily Flowsheet for further details of this session.*     06/23/21 0900   Balance Exercises   Backward Walking Comment 2x45ft, pt demonstrates short step length bilaterally and consistent visual reliance on feet. Mild path deviation noted   Sidestepping Comment 2x 40 ft, fair step length noted in both directions with good ability to control back to midline. No overt LOB noted    Step Overs 1x15 alternating forward step overs increased difficulty with balance when returning to midline with significant postural sway. 1x15 alternating horizontal step overs with improved control compared to forward stepping   Walking w/ Head Turns 4x40 feet horizontal and verical. Significant path deviation noted with head turns, when cued to work on maintaining straight naivgation cadence significantly slows. Periodic instability with 1 LOB but patient is able to self correct   additional exercise Alternating cone taps: initially difficulty with single limb support but with cues to increase control improved balance noted. 2 instances of knocking over cone and one anterior LOB but is able to correct with protective step   Total time 22 neuro re-ed   Functional Activities Exercises   Stairs Navigates 4 stairs with bilateral rail to ascend and 1 rail to descend using step to pattern. Poor eccentric lowering control noted when desending   additional exercise 2x10 step ups, initially leading with LLE with improved control, increased difficulty with RLE step ups and poor control when lowering back to ground. Completes without UE support, fair stability noted   Total time 8 ther act   Alma Friendly PT, DPT

## 2021-06-23 NOTE — Progress Notes (Signed)
Physical Therapy Daily Flowsheet:  *Please see Physical Therapy Exercise Flowsheet for details regarding exercises completed this session.*     06/23/21 0900   Overview   Diagnosis Multiple Sclerosis   Insurance Medicare Blue Choice   Script Date 06/09/21   Visit # 2   Additional Comments Patient reports doing HEP with good compliance. He states he felt more offbalance over weekend but did not experience any falls. He tolerates todays session well with progression of balance interventions. Increased difficulty with stance on RLE noted throughout todays interventions   Pain Assessment   Pain Denies   Patient Education   Patient Education Yes   Educated in disease process Yes   Educated in home exercise program Yes   Additional Patient Education HEP: walking with head turns (horizontal and vertical), standing marches, tandem stance , backward walking, step up, step taps   Time Calculation   PT Timed Codes 30   PT Untimed Codes 00   PT Unbilled Time 0   PT Total Treatment 30   Alma Friendly PT, DPT

## 2021-06-26 ENCOUNTER — Encounter: Payer: Self-pay | Admitting: Neurology

## 2021-06-27 ENCOUNTER — Other Ambulatory Visit: Payer: Medicare (Managed Care) | Admitting: Radiology

## 2021-06-27 ENCOUNTER — Other Ambulatory Visit: Payer: Medicare (Managed Care)

## 2021-06-30 ENCOUNTER — Ambulatory Visit: Payer: Medicare (Managed Care) | Admitting: Physical Therapy

## 2021-06-30 DIAGNOSIS — G35 Multiple sclerosis: Secondary | ICD-10-CM

## 2021-06-30 NOTE — Progress Notes (Signed)
Physical Therapy Exercise Flowsheet:  *Please refer to Physical Therapy Daily Flowsheet for further details of this session.*     06/30/21 0800   Hip Exercises   Hamstring Stretch, Seated 3x30 second holds bilaterally   Foot/Ankle Exercises   Achilles Stretch, Gastroc, Standing Comment 2x45 seconds with heels off step. BUE support for balance   Total time 5 ther ex   Balance Exercises   Backward Walking Comment 2x56ft with short step length bilaterally and slow cadence, occasional instability noted however no LOB noted with this exercise   Step Overs 1x10 over 8in hurdle, increased instability noted with single limb support on RLE.    Walking w/ Head Turns 4x57ft horizontal and 5x58ft vertical head turns. Multiple losses of balance noted withhorizontal head turns requiring reaching out for wall for support. 1 LOB requiring CGA by therapist for stability    additional exercise Cone tapping: initially tapping ipilateral cone but progresses to same and cross body cone taps. greatest instability noted durings stance on R and tapping with L. Pt knocks over cones x3 with L foot. Intermittent CGA provided for stability.   additional exercise Heel walking: ambulates 4x10 ft in // bars with toes elevated, increased dificulty initially maintaining toe clearance but with addition of light UE support is able to complete. Flexed trunk posture noted throughout d/t tightness in hamstrings   additional exercise Toe walking 6x50ft in // bars, fair control noted throughout, no need for UE support for balance.    Total time 25 neuro re-ed   Alma Friendly PT, DPT

## 2021-06-30 NOTE — Progress Notes (Signed)
Physical Therapy Daily Flowsheet:  *Please see Physical Therapy Exercise Flowsheet for details regarding exercises completed this session.*     06/30/21 0800   Overview   Diagnosis Multiple Sclerosis   Insurance Medicare Blue Choice   Script Date 06/09/21   Visit # 3   Additional Comments Patient reports doing well, no falls and was able to play golf this past weekend. Initially demonstrates greater instability during session but with practice by end improved his postural control. He continues to have greatest challenge with single limb support on R leg   Pain Assessment   Pain Denies   Patient Education   Patient Education Yes   Educated in disease process Yes   Educated in home exercise program Yes   Additional Patient Education HEP: walking with head turns (horizontal and vertical), standing marches, tandem stance , backward walking, step up, step taps, hamstring stretch, gastroc stretch, toe walking/heel walking   Time Calculation   PT Timed Codes 30   PT Untimed Codes 0   PT Unbilled Time 0   PT Total Treatment 30   Alma Friendly PT, DPT

## 2021-07-07 ENCOUNTER — Ambulatory Visit: Payer: Medicare (Managed Care) | Admitting: Physical Therapy

## 2021-07-07 DIAGNOSIS — G35 Multiple sclerosis: Secondary | ICD-10-CM

## 2021-07-07 NOTE — Progress Notes (Signed)
Physical Therapy Daily Flowsheet:  *Please see Physical Therapy Exercise Flowsheet for details regarding exercises completed this session.*     07/07/21 0800   Overview   Diagnosis Multiple Sclerosis   Insurance Medicare Blue Choice   Script Date 06/08/21   Visit # 4   Additional Comments Patient reports doing well, continue to golf with good success. He tolerates todays session well with progression of balance interventions. Continue to progress as tolerated   Pain Assessment   Pain Denies   Patient Education   Patient Education Yes   Educated in disease process Yes   Educated in home exercise program Yes   Additional Patient Education HEP: walking with head turns (horizontal and vertical), standing marches, tandem stance , backward walking, step up, step taps, hamstring stretch, gastroc stretch, toe walking/heel walking   Time Calculation   PT Timed Codes 30   PT Untimed Codes 0   PT Unbilled Time 0   PT Total Treatment 30   Alma Friendly PT, DPT

## 2021-07-07 NOTE — Progress Notes (Signed)
Physical Therapy Exercise Flowsheet:  *Please refer to Physical Therapy Daily Flowsheet for further details of this session.*     07/07/21 0900   Hip Exercises   Hamstring Stretch, Seated 3x30 second holds bilaterally   Knee Exercises   additional exercise Wall sit 3x15 second holds each.    Balance Exercises   Backward Walking Comment 6x55ft in //bars, improvement in step length noted bilaterally, minimal path deviation noted   Sidestepping Comment 6x57ft in // bars with orange TB. With addition of TB greater emphasis on eccentic control, with fatigue increased difficulty noted   Step Overs 1x20 over 8 in step   Total time 20 ther ex   Functional Activities Exercises   Ambulation  Resisted ambulation with bungees. 6x63ft, deceased cadence noted. Occasional losses of balance but is able to self correct.    Sit To Stand Comment 1x10 from standard chair height with 6lb medicine ball, addition of chest press with ball at top. By end of set increaseing fatigue requiring greater anterior weight shift for stand.   Total time 10 ther act   Alma Friendly PT, DPT

## 2021-07-15 ENCOUNTER — Ambulatory Visit: Payer: Medicare (Managed Care) | Attending: Neurology | Admitting: Physical Therapy

## 2021-07-15 ENCOUNTER — Other Ambulatory Visit: Payer: Self-pay

## 2021-07-15 DIAGNOSIS — G35 Multiple sclerosis: Secondary | ICD-10-CM | POA: Insufficient documentation

## 2021-07-15 NOTE — Progress Notes (Signed)
Physical Therapy Exercise Flowsheet:  *Please refer to Physical Therapy Daily Flowsheet for further details of this session.*     07/15/21 1500   Knee Exercises   Lunge In // bars patient completes 1x10 alternating forward lunges. Periodic instability noted requiring UE support on bars but by end of set is able to demonstrate improved control   Foot/Ankle Exercises   Heel Raises, Standing Comment 1x10 bilateral heel raises without UE support, occasional posterior losses of balance noted which he corrected with UE support   Toe Raises, Standing Comment 1x10 bilateral toe raises with BUE support for balance. Able to clear toes bilaterally but small range of motion noted    Balance Exercises   Additional Exercise Walking with eyes closed 4x58ft in // bars, increased hesitancy and instability noted resulting in short shuffling steps bilaterally.   additional exercise Measurements taken for progress note, discussion of findings and ongoing plan of care   Total time 28 ther ex   Alma Friendly PT, DPT

## 2021-07-15 NOTE — Progress Notes (Signed)
Physical Therapy Daily Flowsheet:  *Please see Physical Therapy Exercise Flowsheet for details regarding exercises completed this session.*     07/15/21 1400   Overview   Diagnosis Multiple Sclerosis   Insurance Medicare Blue Choice   Script Date 06/08/21   Visit # 5   Additional Comments See progress note for additional information   Pain Assessment   Pain Denies   Functional Outcome Measures   Gait Level Surface 3   Change in gait speed 3   Gait with horizontal head turns 2   Gait with vertical head turns 2   Gait and pivot turns 3   Step over obstacle 2   Gait with narrow base of support 1   Gait with eyes closed 2   Ambulating backwards 2   Steps 2   Gait assessment total 22   Distance(meters) 10   Time(sec) 7.6   Meters/Sec 1.32   Patient Education   Educated in disease process Yes   Educated in home exercise program Yes   Additional Patient Education HEP: walking with head turns (horizontal and vertical), standing marches, tandem stance , backward walking, step up, step taps, hamstring stretch, gastroc stretch, toe walking/heel walking, forward lunge   Time Calculation   PT Timed Codes 28   PT Untimed Codes 0   PT Unbilled Time 0   PT Total Treatment 28   Alma Friendly PT, DPT

## 2021-07-15 NOTE — Progress Notes (Signed)
Department of Physical Medicine & Rehabilitation  Physical Therapy Progress Note    HISTORY  Patient's Medical Diagnosis:  Diagnosis: Multiple Sclerosis  Referring practitioner: Lance Bosch, NP  Reason for referral: Patient has long history of multiple sclerosis and is now having Increasing weakness and balance   Onset date on symptoms/Date of Surgery: 25 years ago  Previous treatments: Previous PT (2021, 2016)  Previous Functional Level: Independent  Attendance:  Consistent, compliant with HEP    SUBJECTIVE  Pain:     Denies                Current functional limitations:   Current: Patient reports that he feels like he has good and bad days, some days he feels the exercises and walking are easier and others its harder. Overall isn't feeling much progress in terms of balance. He continues to feel wobbly and unsure of his walking. Continues to not have any falls.    September 2022:Patient reports difficulty with stairs, feels unstable. He uses walking sticks when going for longer walk. He denies falls, however states he trips occasionally but is able to catch himself. Trips normally caused by legs not wanting to pick up     Patient Goals for Therapy: Improve balance and feel more stable on his feet    OBJECTIVE  Observation: Patient is a pleasant and cooperative male in NAD.  Cognition: no deficit noted  Vision: Wears corrective lenses  Sensation:  Numbness in bilateral feet and hands which has been chronic   Coordination: bilateral upper and lower extremities:  intact  Tone: not tested      ROM:       R UE: AROM within functional limits   L UE: AROM within functional limits   R LE: AROM within functional limits   L LE: AROM within functional limits    *Indicates pain    Strength:      Muscle group Right  Left    Shoulder Flexion     Elbow Flexion     Elbow Extension     Wrist Flexion     Wrist Extension     Hip Flexors  4+ 4+   Hip Extensors     Hip Abductors     Hip Adductors     Knee Extensors  5 5   Knee  Flexors 5  5   Ankle Dorsiflexors 5 5   Ankle Plantar flexors      Ankle Inverters      Ankle Evertors     Extensor hallucis longus          Functional assessment:         Bed mobility:  Not tested but reports independent      Transfers:  Independent with sit to stand transfers, when not using UE requires excessive anterior trunk lean to improve ease of transfer     Ambulation:    Patient ambulates independently without assistive device, he displays occasional foot flat contact and periodic catching toes during ambulation trials. Occasional bilaterally knee hyperextension noted- maintained     Stairs: Independent with reciprocal pattern with 1 rails. Slow cadence- Improved     Balance:  Romberg 30 seconds eyes open and closed  Tandem: unable to achieve tandem stance without UE support. Once in position holds for 30 seconds with R in front and 25 seconds with L in front   Functional Performance Outcome Measures:       FGA: 22/11 July 2021:  20/30  Gait speed: 10 walk test: 1.19ms without assistive device   September 2022:1.2 m/s      10 meter Gait speed Cut-off scores   <0.474m Likely household ambulator1   0.4-0.47m74mLimited community ambulator (*<0.07 increased risk of falls, hospitalization, and mortality)1   >0.47m/62mommunity ambulator (1.2-1.42m/s29m safely cross street)1   <0.47m/s 79micative of frailty2   <1.83m/s I62mtifies well-functioning people at high risk of adverse health-related outcomes3   1. Bowden,Arman Filter, Lala Lundman, A. L., [place] (2008). Validation of a speed-based classification system using quantitative measures of walking performance poststroke. Neurorehabilitation and neural repair, 22(6), [phone]:/OpenBloggers.co.ukegg, Janetta Hora [place]. Diagnostic test accuracy of simple instruments for identifying frailty in community-dwelling older people: a systematic review. Age And Ageing, 44(1), 148-152.  https:/[email or web address].co.[email or web address],Viviano Simasenninx, B., Nicklas, B., Simonsick, E., & Newman,Lucia Gaskins al. (2005). Prognostic Value of Usual Gait Speed in Well-Functioning Older PeopleResults from the Health, Aging and Body Composition Study. JournalGans), [phone]:/HikingMonthly.fiCID for Geriatrics: 0.1-0.142m/s1, 2    1. Perera,Randolm Idol Studenski, S. (2006). Meaningful Change and Responsiveness in Common Physical Performance Measures in Older Adults. JournalDows, [phone] [phone]:/CPAsOnDemand.dkhannon, R., & Glenney, S. (2014). Minimal clinically important difference for change in comfortable gait speed of adults with pathology: a systematic review. Journal Of Evaluation In Clinical Practice, 20(4), 295-300. https:/PharmacyEncyclopedia.com.pt           Endurance: good     Patient Education:     Patient Education  Educated in disease process: Yes  Educated in home exercise program: Yes  Additional Patient Education: HEP: walking with head turns (horizontal and vertical), standing marches, tandem stance , backward walking, step up, step taps, hamstring stretch, gastroc stretch, toe walking/heel walking, forward lunge      ASSESSMENT  [name] y.o.46ale with a history of Diagnosis: Multiple Sclerosis. He currently presents to outpatient physical therapy with deficits in sensation, balance and endurance resulting in difficulty in transfers, ambulation and stair navigation. Gerald haKasaimonstrated improvements in his dynamic balance since starting physical therapy and would benefit from ongoing skilled PT to maximize functional independence and to maximize the goals below    Short term goals: 4 weeks  HEP/Family Training Goals:   Patient will be independent with basic home exercise routine. - Met  Outcome Measures  Goals:   FGA: Patient will achieve a 22/30 on the Functional Gait Assessment in order to limit fall risk.  - Met  Patient will demonstrate ability to maintain tandem stance for 30 seconds bilaterally without UE support -In progress  Patient will navigate stairs independently with single UE support and reciprocal pattern - Met    Long Term Goals: 12 weeks  HEP/Family Training Goals:   Patient will be independent with comprehensive home exercise routine.- In progress  Outcome Measures Goals:   FGA: Patient will achieve a 27/30 on the Functional Gait Assessment in order to limit fall risk. - In progress  Patient will demonstrate normalized mCTSIB testing in all domains - In progress  Patient will navigate stairs independently without UE support in a reciprocal pattern. - In progress  Patient will demonstrate ability to jog at slow cadence for 5 minutes. - In progress  PLAN  Plan of Care: Remains appropriate for PT  PT frequency:  Once a week  PT duration: 4 of anticipated 12 weeks            Thank you for the referral.  If you have any questions and/or concerns, please feel free to contact me at [phone].            Morrell Riddle PT, DPT                      Department of Physical Medicine & Rehabilitation    PLAN OF CARE    Physician:  Lance Bosch, NP    I have reviewed your progress note and agree with the documented goals and Plan of Care      07/15/2021

## 2021-07-21 ENCOUNTER — Ambulatory Visit: Payer: Medicare (Managed Care) | Admitting: Physical Therapy

## 2021-07-21 ENCOUNTER — Other Ambulatory Visit: Payer: Self-pay

## 2021-07-21 DIAGNOSIS — G35 Multiple sclerosis: Secondary | ICD-10-CM

## 2021-07-21 NOTE — Progress Notes (Signed)
Physical Therapy Exercise Flowsheet:  *Please refer to Physical Therapy Daily Flowsheet for further details of this session.*     07/21/21 0900   Knee Exercises   Lunge Bosu forward lunges 2x15 alternating leading leg, he completes alongside raised mat table for occasional UE support. 2x15 lateral lunges onto bosu ball. Increased stability noted with lateral lunges compared to forward lunges.   Balance Exercises   Standing Balance, Static Comment Romberg eyes closed 30 seconds with fair control, addition of lateral and anterior/posterior weigth shifting with eyes closed with fair stability noted throughout.  Eyes closed on foam 30 seconds with moderate sway     additional exercise Static standing on bosu ball 3x 1 minute bouts. Patient with increased instability and time required to get when stepping up onto bosu and getting positioned. He uses pole initially to steady himself but then is able to hold balance without UE support.    Total time 30 neuro re-ed   Alma Friendly PT, DPT

## 2021-07-21 NOTE — Progress Notes (Signed)
Physical Therapy Daily Flowsheet:  *Please see Physical Therapy Exercise Flowsheet for details regarding exercises completed this session.*     07/21/21 0900   Overview   Diagnosis Multiple Sclerosis   Insurance Medicare Blue Choice   Script Date 06/08/21   Visit # 6   Additional Comments [name] reports increased difficulty with eyes closed interventions as he struggles to sense where he is in space when completing. He tolerates todays session well with greater emphasis on static balance with eyes closed and use of bosu ball today. Continue to progress as tolerated    Pain Assessment   Pain Denies   Patient Education   Educated in disease process Yes   Educated in home exercise program Yes   Additional Patient Education HEP: walking with head turns (horizontal and vertical), standing marches, tandem stance , backward walking, step up, step taps, hamstring stretch, gastroc stretch, toe walking/heel walking, forward lunge   Time Calculation   PT Timed Codes 30   PT Untimed Codes 0   PT Unbilled Time 0   PT Total Treatment 30   Alma Friendly PT, DPT

## 2021-07-28 ENCOUNTER — Ambulatory Visit: Payer: Medicare (Managed Care) | Admitting: Physical Therapy

## 2021-07-29 ENCOUNTER — Encounter: Payer: Self-pay | Admitting: Neurology

## 2021-07-29 DIAGNOSIS — F419 Anxiety disorder, unspecified: Secondary | ICD-10-CM

## 2021-07-29 DIAGNOSIS — G35 Multiple sclerosis: Secondary | ICD-10-CM

## 2021-07-30 MED ORDER — DIAZEPAM 5 MG PO TABS *I*
ORAL_TABLET | ORAL | 0 refills | Status: DC
Start: 2021-07-30 — End: 2021-12-10

## 2021-07-30 NOTE — Telephone Encounter (Signed)
Pt is requesting diazepam for upcoming MRI scheduled on 10/28.  Order placed as it has been prescribed in the past and sent to NP pool for signature.

## 2021-08-04 ENCOUNTER — Other Ambulatory Visit: Payer: Self-pay

## 2021-08-04 ENCOUNTER — Ambulatory Visit: Payer: Medicare (Managed Care) | Admitting: Physical Therapy

## 2021-08-04 DIAGNOSIS — G35 Multiple sclerosis: Secondary | ICD-10-CM

## 2021-08-04 NOTE — Progress Notes (Signed)
Physical Therapy Exercise Flowsheet:  *Please refer to Physical Therapy Daily Flowsheet for further details of this session.*     08/04/21 0900   Balance Exercises   Backward Walking Comment 4x51ft initially with step to pattern but is able to progress to step through with improved sequencing and stability, benefits from cues for wider BOS   Sidestepping Comment 2x67ft with purple TB once in each direction, displays good stability throughout, benefits from occasional cues for improved eccentric control   Walking w/ Head Turns 4x53ft ambulation with horizontal head turns and 4x57ft ambulation with vertical head turns, Initially heavy reliance on reaching out to walls for support but with practice is able to improve his stability   Additional Exercise Monster walk 2x57ft with purple TB, good stability noted throughout   additional exercise Romberg eyes closed on foam 30 seconds with moderate sway   additional exercise Lateral and A/P weight shifting on foam pad, completes 10x in each direction with fair stability   Total time 30 neuro re-ed   Alma Friendly PT, DPT

## 2021-08-04 NOTE — Progress Notes (Signed)
Physical Therapy Daily Flowsheet:  *Please see Physical Therapy Exercise Flowsheet for details regarding exercises completed this session.*     08/04/21 0900   Overview   Diagnosis Multiple Sclerosis   Insurance Medicare Blue Choice   Script Date 06/08/21   Visit # 7   Additional Comments [name] reports doing well, continues to do his HEP, reports no falls. He tolerates session well with fair stability throughout   Pain Assessment   Pain Denies   Patient Education   Educated in disease process Yes   Educated in home exercise program Yes   Time Calculation   PT Timed Codes 30   PT Untimed Codes 0   PT Unbilled Time 0   PT Total Treatment 30   Alma Friendly PT, DPT

## 2021-08-05 ENCOUNTER — Ambulatory Visit: Payer: Medicare (Managed Care) | Admitting: Neurology

## 2021-08-08 ENCOUNTER — Other Ambulatory Visit: Payer: Self-pay

## 2021-08-08 ENCOUNTER — Ambulatory Visit
Admission: RE | Admit: 2021-08-08 | Discharge: 2021-08-08 | Disposition: A | Discharge status: Home / Self Care | Payer: Medicare (Managed Care) | Source: Ambulatory Visit | Attending: Neurology | Admitting: Neurology

## 2021-08-08 DIAGNOSIS — G35 Multiple sclerosis: Secondary | ICD-10-CM | POA: Insufficient documentation

## 2021-08-08 DIAGNOSIS — G939 Disorder of brain, unspecified: Secondary | ICD-10-CM | POA: Insufficient documentation

## 2021-08-08 MED ORDER — GADOTERIDOL 279.3 MG/ML (PROHANCE) IV SOLN *I*
15.0000 mL | Freq: Once | INTRAVENOUS | Status: AC
Start: 2021-08-08 — End: 2021-08-08
  Administered 2021-08-08: 15 mL via INTRAVENOUS

## 2021-08-11 ENCOUNTER — Ambulatory Visit: Payer: Medicare (Managed Care) | Attending: Neurology | Admitting: Neurology

## 2021-08-11 DIAGNOSIS — G35 Multiple sclerosis: Secondary | ICD-10-CM | POA: Insufficient documentation

## 2021-08-11 NOTE — Progress Notes (Addendum)
Multiple Sclerosis Clinic Follow-up Visit    Subjective:  [name] is a 70 y.o. 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     He was last seen on 01/31/2021. At his last visit, he was continued on Ocrevus and dalfampradine for symptomatic tx. He was to see Dr. Broadus [name] in urology.     Since his last visit, he had MRI which was stable.     He continues to have some issues with imbalance, but all about the same as before. Had some worsening over the summer and got high dose steroids which did not seem to give much improvement. Still using the ampyra, feels it helps. No falls, though he does stumble occasionally.    Had COVID in July, took Paxlovid, did get rebound activity but was not seriously ill/require hospitalization.    Due for Evusheld in February. He got flu vaccine and will be getting updated booster soon.       MS History:  Clinical presentations:   1990's R arm weakness   Later numbness hands and feet and stereotyped abnormal distal R thigh sensation  MRIs:   4/08 brain-relatively mild lesion burden, 1 enhancing lesion at the time   5/13 brain-no new lesions   11/14 brain-unchanged   10/16 brain-unchanged   4/18 brain-unchanged                 Thoracic-few, non-enhancing intramedullary lesions   12/20 brain-unchanged                  Thoracic cord- at least 3 new intramedullary lesions (no contrast given)   6/21 brain and thoracic cord-unchanged   07/2021: brain unchanged  Other testing:   12/20 JC index 0.33, indeterminate to negative   6/21 natalizumab antibody +   2/22 B cells 3, Cr 1.07  Disease modifying treatment hx:   Betaseron started in 1990's, stopped due to injection site reactions/infections   Tecfidera started 5/13, stopped due to new thoracic cord lesions   1/21 Tysabri started, stopped due to +natalizumab antibody   Ocrevus started 7/21 and continued through the present  Symptomatic treatment hx:   ED-Cialis helps   Foot cramps-baclofen can be  helpful   Stereotyped R thigh abnormal sensation-less bothersome, not certain if Tegretol helped   Knee pain-acupuncture   Gait-Ampyra, gait worsened when it was stopped. PT less helpful in the past. 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 Has 3 children. Never smoker.       Meds:  Current Outpatient Medications on File Prior to Visit   Medication Sig Dispense Refill    tadalafil (CIALIS) 5 MG  tablet TAKE 1 TABLET BY MOUTH EVERY DAY 90 tablet 3    diazePAM (VALIUM) 5 mg tablet for Feeling Anxious Take 1 tab PO 60 minutes before MRI. One tab 15-30 minutes before MRI, and 1-2 tabs as needed for anxiety related to MRI. MDD: 4 tabs. MAY NOT DRIVE TO OR FROM MRI 4 tablet 0    benzonatate (TESSALON) 200 mg capsule Take 1 capsule (200 mg total) by mouth 3 times daily as needed for Cough 30 capsule 1    dalfampridine (AMPYRA) 10 MG tablet TAKE 1 TABLET TWICE A DAY 60 tablet 11    predniSONE (DELTASONE) 50 mg tablet for Multiple Sclerosis, MS exacerbation Take 10 tabs (500 mg) PO first thing in the morning, 10 tabs (500 mg) before noon x 3 days. MDD: 20 tabs 60 tablet 0    predniSONE 10 MG (21) tablet pak for Multiple Sclerosis, MS exacerbation Start day after finishing high dose steroids: Take 6 tabs PO on day 1, 5 tabs on day 2, 4 tabs on day 3, 3 tabs on day 4, 2 tabs on day 5, one tab on day 6. 21 tablet 0     meclizine (ANTIVERT) 25 mg tablet Take 1 tablet (25 mg total) by mouth 3 times daily as needed 30 tablet 0    ocrelizumab (OCREVUS) 300 MG/10ML injection Administer into the vein every 6 months      ascorbic acid (VITAMIN C) 100 MG tablet Take 100 mg by mouth daily      cholecalciferol (VITAMIN D) 1000 UNIT tablet Take 1,000 Units by mouth daily      Cyanocobalamin (VITAMIN B-12 CR PO) Take 1,000 mcg by mouth daily          No current facility-administered medications on file prior to visit.        ROS:  10+ROS reviewed. Pertinent positives and negatives in HPI.     Vitals  There were no vitals taken for this visit.    PE:  Gen: Well appearing, NAD.  MS: Alert. Fluent. Affect appropriate for situation.  CN:   Choppy pursuits. Full facial strength. No dysarthria.  Motor: No pronator drift. No abnormal movements. Able to do a shallow squat and stand without support.  Coord: No ataxia on FTN. Finger taps normal. +Romberg  Gait: Mildly wide-based spastic gait,somewhat imbalanced, slow to turn    Labs 05/26/2021:  Immunoglobulins wnl  Cd19 #1        Assesment and Plan:  70 y.o. M with progressive MS with activity with some worsening of function over the summer, no improvement with high dose steroids. With some gait impairment which prevents him from doing activities like tennis, pickleball. Tolerates Ocrevus, so will continue this to prevent activity, although will not fully address the progressive aspect of the MS. Symptomatically will continue with PT and Ampyra.     Plan:  - continue Ocrevus  - labs q39mo with infusions  - MRI brain and c spine occurred 08/03/2021, yearly MRIs  - Ongoing exercise and use of Ampyra for symptomatic tx  - f/u in 6 months      Gerald Theda Belfast, MD  Neuroimmunology    Attending Addendum:    I discussed and examined this patient with Dr [name]. With minor edits I agree with the history, findings, assessment and plan.    Gerald Sessler, MD  Neuroimmunology

## 2021-08-20 ENCOUNTER — Encounter: Payer: Self-pay | Admitting: Neurology

## 2021-10-01 ENCOUNTER — Encounter: Payer: Self-pay | Admitting: Neurology

## 2021-10-01 ENCOUNTER — Encounter: Payer: Self-pay | Admitting: Primary Care

## 2021-10-02 ENCOUNTER — Encounter: Payer: Self-pay | Admitting: Primary Care

## 2021-10-02 MED ORDER — TADALAFIL 5 MG PO TABS *I*
5.0000 mg | ORAL_TABLET | Freq: Every day | ORAL | 3 refills | Status: DC
Start: 2021-10-02 — End: 2022-11-02

## 2021-11-17 ENCOUNTER — Other Ambulatory Visit: Payer: Self-pay

## 2021-11-17 NOTE — Progress Notes (Unsigned)
Need orders for ocrevus

## 2021-11-19 ENCOUNTER — Other Ambulatory Visit: Payer: Self-pay

## 2021-11-21 ENCOUNTER — Other Ambulatory Visit: Payer: Self-pay

## 2021-11-21 ENCOUNTER — Telehealth: Payer: Self-pay

## 2021-11-21 NOTE — Telephone Encounter (Addendum)
[name] is still interested and comfortable coming in for treatment.  I completed the COVID-19 screen, patient confirmed that they have not had any exposure and does not have any symptoms.  I explained that we are taking extra precautions and using some social distancing practices and patient knows to call if they start to develop symptoms or learns that they have come in contact with a confirmed case.    Do you feel like you could have an infection or are you taking antibiotics for an infection? no    Confirmed with patient that they have had no insurance changes    Patient will make next infusion appt and follow up at [facility] infusion.  AMB [facility] PRIOR AUTH 04/22/2020   Treatment/Medication Name Ocrevus  300mg  day 1 & 15, 600mg  every 6 months   Date of Service    Authorization Type Medication   CPT Code J2350   Diagnosis Code G35   Physician Name and NPI   Service Location [facility]   Prior Authorization Status Authorization not required   Request Date    Authorization Number    Authorization Start Date    Authorization End Date    Insurance Company Name [employer]   Policy Number Gaetana Michaelis

## 2021-11-24 ENCOUNTER — Other Ambulatory Visit: Payer: Self-pay

## 2021-11-24 ENCOUNTER — Ambulatory Visit: Payer: Medicare (Managed Care)

## 2021-11-24 VITALS — BP 121/69 | HR 63 | Temp 98.1°F | Resp 16

## 2021-11-24 DIAGNOSIS — G35 Multiple sclerosis: Secondary | ICD-10-CM | POA: Insufficient documentation

## 2021-11-24 LAB — COMPREHENSIVE METABOLIC PANEL
ALT: 31 U/L (ref 0–50)
AST: 38 U/L (ref 0–50)
Albumin: 4 g/dL (ref 3.5–5.2)
Alk Phos: 77 U/L (ref 40–130)
Anion Gap: 8 (ref 7–16)
Bilirubin,Total: 0.5 mg/dL (ref 0.0–1.2)
CO2: 28 mmol/L (ref 20–28)
Calcium: 9.3 mg/dL (ref 8.6–10.2)
Chloride: 104 mmol/L (ref 96–108)
Creatinine: 1.03 mg/dL (ref 0.67–1.17)
Glucose: 72 mg/dL (ref 60–99)
Lab: 14 mg/dL (ref 6–20)
Potassium: 4 mmol/L (ref 3.3–5.1)
Sodium: 140 mmol/L (ref 133–145)
Total Protein: 5.9 g/dL — ABNORMAL LOW (ref 6.3–7.7)
eGFR BY CREAT: 77 *

## 2021-11-24 LAB — LYMPHOCYTE SUBSET (T & B CELLS)
B LYM #(CD19): 0 cells/uL — ABNORMAL LOW (ref 120–725)
B LYM %(CD19): 0 % — ABNORMAL LOW (ref 7–36)
CD4#: 505 cells/uL (ref 496–2186)
CD4%: 52 % (ref 32–71)
CD4/CD8: 2.9 (ref 0.7–3.0)
CD8#: 177 cells/uL (ref 177–1137)
NK LYM #(CD16+56): 281 cells/uL (ref 37–758)
NK LYM %(CD16+56): 27 % — ABNORMAL HIGH (ref 4–26)
NK LYM# (CD3+16+56+): 70 cells/uL
NK LYM% (CD3+16+56+): 7 %
T LYM #(CD3): 708 cells/uL — ABNORMAL LOW (ref 754–2810)
T LYM %(CD3): 71 % (ref 54–87)
T Suppress %(CD8): 18 % (ref 10–38)

## 2021-11-24 LAB — CBC AND DIFFERENTIAL
Baso # K/uL: 0 10*3/uL (ref 0.0–0.1)
Basophil %: 0.9 %
Eos # K/uL: 0.1 10*3/uL (ref 0.0–0.5)
Eosinophil %: 2.6 %
Hematocrit: 42 % (ref 40–51)
Hemoglobin: 14.8 g/dL (ref 13.7–17.5)
IMM Granulocytes #: 0 10*3/uL (ref 0.0–0.0)
IMM Granulocytes: 0.4 %
Lymph # K/uL: 1.1 10*3/uL — ABNORMAL LOW (ref 1.3–3.6)
Lymphocyte %: 23.7 %
MCH: 31 pg (ref 26–32)
MCHC: 35 g/dL (ref 32–37)
MCV: 87 fL (ref 79–92)
Mono # K/uL: 0.6 10*3/uL (ref 0.3–0.8)
Monocyte %: 11.8 %
Neut # K/uL: 2.8 10*3/uL (ref 1.8–5.4)
Nucl RBC # K/uL: 0 10*3/uL (ref 0.0–0.0)
Nucl RBC %: 0 /100 WBC (ref 0.0–0.2)
Platelets: 209 10*3/uL (ref 150–330)
RBC: 4.8 MIL/uL (ref 4.6–6.1)
RDW: 12.9 % (ref 11.6–14.4)
Seg Neut %: 60.6 %
WBC: 4.7 10*3/uL (ref 4.2–9.1)

## 2021-11-24 LAB — IGG: IgG: 670 mg/dL — ABNORMAL LOW (ref 700–1600)

## 2021-11-24 LAB — IGM: IgM: 183 mg/dL (ref 40–230)

## 2021-11-24 LAB — IGA: IgA: 153 mg/dL (ref 70–400)

## 2021-11-24 MED ORDER — SODIUM CHLORIDE 0.9 % IV SOLN WRAPPED *I*
50.0000 mL/h | Status: DC | PRN
Start: 2021-11-24 — End: 2021-11-24
  Administered 2021-11-24: 50 mL/h via INTRAVENOUS

## 2021-11-24 MED ORDER — CETIRIZINE HCL 10 MG PO TABS *I*
10.0000 mg | ORAL_TABLET | Freq: Once | ORAL | Status: AC
Start: 2021-11-24 — End: 2021-11-24
  Administered 2021-11-24: 10 mg via ORAL
  Filled 2021-11-24: qty 1

## 2021-11-24 MED ORDER — ACETAMINOPHEN 325 MG PO TABS *I*
975.0000 mg | ORAL_TABLET | Freq: Once | ORAL | Status: AC
Start: 2021-11-24 — End: 2021-11-24
  Administered 2021-11-24: 975 mg via ORAL
  Filled 2021-11-24: qty 3

## 2021-11-24 MED ORDER — METHYLPREDNISOLONE SOD SUCC 125 MG IJ SOLR(62.5MG/ML) *WRAPPED*
125.0000 mg | Freq: Once | INTRAMUSCULAR | Status: AC
Start: 2021-11-24 — End: 2021-11-24
  Administered 2021-11-24: 125 mg via INTRAVENOUS
  Filled 2021-11-24: qty 2

## 2021-11-24 MED ORDER — SODIUM CHLORIDE 0.9 % IV SOLN WRAPPED *I*
600.0000 mg | Freq: Once | Status: AC
Start: 2021-11-24 — End: 2021-11-24
  Administered 2021-11-24: 600 mg via INTRAVENOUS
  Filled 2021-11-24: qty 20

## 2021-11-24 MED ORDER — DIPHENHYDRAMINE HCL 25 MG ORAL SOLID *WRAPPED*
25.0000 mg | Freq: Once | ORAL | Status: AC
Start: 2021-11-24 — End: 2021-11-24
  Administered 2021-11-24: 25 mg via ORAL
  Filled 2021-11-24: qty 1

## 2021-11-24 NOTE — Progress Notes (Signed)
Pt arrives for Ocrevus infusion # 5 . Denies recent illness, infection or fever.  Pre-medicated as per MAR.   Ocrevus infused per rapid infusion protocol. Pt tolerated infusion well, and was discharged home stable.

## 2021-11-24 NOTE — Patient Instructions (Signed)
Please reach out to the clinic with any additional questions or concerns. We encourage you to sign up and use MyChart for any non-urgent medical questions as MyChart is the preferred method of communication.  Please contact your pharmacy for medication refills and allow up to 2 business days for routine prescription refills.  For urgent messages, please call the clinic at [phone].  The clinic is open from 8am to 4:30pm Monday through Friday.    You can get [facility] appointment reminders by text - please make sure we have your cell phone number on file at check in/check-out.  Then you can just text [facility] to [phone] to receive appointment messages.         Continue with your usual medications as prescribed.    Ocrevus can increase your risk of respiratory, skin, and herpes infections, please call [phone] with any of the following:    Fevers/chills  Cough that will not go away  Signs and symptoms of respiratory infections  Cold sores/shingles/genital sores  Itching/rashes/hives     Side effects can occur up to a week after infusion.  If you experience body aches or low grade fever you can take Tylenol or Ibuprofen as prescribed. If you experience a rash or itching you can take Benadryl or any other allergy medicine you may have as prescribed. Please call the patient line at [phone] if symptoms don't go away.    You may have more chance of getting an infection. Wash hands often. Stay away from people with infections, colds, or flu. Continue to wear your mask out in public. It is highly recommended that you get a COVID-19 vaccine and booster. People who are fully vaccinated on this therapy have gotten COVID-19. If you think you may have COVID-19 get tested right away. If you are positive you may qualify for medications to lessen the severity of COVID-19. There is a limited time frame these medications can be administered so please get tested as soon as possible and let your neurologist know of a  positive test result.    You should not receive any live vaccines while on Ocrevus.  Please check with your MD first. Be sure to have regular breast exams. Your doctor will tell you know how often to have these. You will also need to do breast self-exams as your doctor has told you.  Women should avoid pregnancy while taking this medication. Let your Doctor know right away if you become pregnant.    Schedule a follow-up with your provider as close to your infusion appointment as possible but within 4 -6 months after your infusion.  Contact Neurology Scheduling for assistance at [phone] and listen for the prompts.    If you have an insurance changes, please notify the office at [phone] as benefits must be verified.

## 2021-11-26 ENCOUNTER — Encounter: Payer: Self-pay | Admitting: Neurology

## 2021-11-30 NOTE — Telephone Encounter (Signed)
I'm sorry that I don't know the exact details about this company's planned clinical trials in [place]. I know that Dr Derrill Kay was in discussion with the company, so is likely to know the details about when they'll be recruiting and what the inclusion criteria will be. Could you please check with him?

## 2021-12-01 ENCOUNTER — Encounter: Payer: Self-pay | Admitting: Primary Care

## 2021-12-01 ENCOUNTER — Encounter: Payer: Self-pay | Admitting: Urology

## 2021-12-01 DIAGNOSIS — E7800 Pure hypercholesterolemia, unspecified: Secondary | ICD-10-CM

## 2021-12-01 NOTE — Telephone Encounter (Signed)
Lab results: 04/16/21  0714   Cholesterol 211*   HDL 60   LDL Calculated 136*   Triglycerides 75   Non HDL Cholesterol 151   Chol/HDL Ratio 3.5

## 2021-12-03 ENCOUNTER — Other Ambulatory Visit
Admission: RE | Admit: 2021-12-03 | Discharge: 2021-12-03 | Disposition: A | Discharge status: Home / Self Care | Payer: Medicare (Managed Care) | Source: Ambulatory Visit | Attending: Primary Care | Admitting: Primary Care

## 2021-12-03 DIAGNOSIS — E7800 Pure hypercholesterolemia, unspecified: Secondary | ICD-10-CM | POA: Insufficient documentation

## 2021-12-03 LAB — LIPID PANEL
Chol/HDL Ratio: 3.9
Cholesterol: 206 mg/dL — AB
HDL: 53 mg/dL (ref 40–60)
LDL Calculated: 127 mg/dL
Non HDL Cholesterol: 153 mg/dL
Triglycerides: 128 mg/dL

## 2021-12-10 ENCOUNTER — Encounter: Payer: Self-pay | Admitting: Orthopedic Surgery

## 2021-12-10 ENCOUNTER — Ambulatory Visit: Payer: Medicare (Managed Care) | Admitting: Orthopedic Surgery

## 2021-12-10 ENCOUNTER — Ambulatory Visit
Admission: RE | Admit: 2021-12-10 | Discharge: 2021-12-10 | Disposition: A | Discharge status: Home / Self Care | Payer: Medicare (Managed Care) | Source: Ambulatory Visit

## 2021-12-10 ENCOUNTER — Other Ambulatory Visit: Payer: Self-pay

## 2021-12-10 VITALS — BP 134/80 | HR 52 | Ht 71.0 in | Wt 168.0 lb

## 2021-12-10 DIAGNOSIS — M1712 Unilateral primary osteoarthritis, left knee: Secondary | ICD-10-CM

## 2021-12-10 DIAGNOSIS — Z471 Aftercare following joint replacement surgery: Secondary | ICD-10-CM

## 2021-12-10 DIAGNOSIS — Z0189 Encounter for other specified special examinations: Secondary | ICD-10-CM

## 2021-12-10 DIAGNOSIS — Z96651 Presence of right artificial knee joint: Secondary | ICD-10-CM

## 2021-12-10 NOTE — Patient Instructions (Signed)
VIEW YOUR HEALTH INFORMATION ONLINE TODAY!    Below is your activation code to sign up for MyChart, [facility]’s free website that allows you to view most test results, send and receive messages from your doctor, renew your prescriptions, request an appointment, and much more!    How Do I Sign Up?    Important:  You must have an email address to sign up for MyChart.    1. Go to [email or web address]  2. Click on the Sign Up (I have a code) link under "New User?" located in the blue box on the left hand side of the screen.    3. Enter your MyChart Activation Code: Activation code not generated  4. Current [place] MyChart Status: Active  5. Enter your Date of Birth, Zip Code and Phone Number, and click NEXT.  6. Continue following the instructions to complete your MyChart sign up.    • You also may want to write down your Username and Password below:    MY USERNAME:  _______________________________________________    MY PASSWORD: _______________________________________________    Additional Information    If you are not the patient, but you are involved in the health care of this patient, DO NOT USE THIS CODE!  Instead, go to the MyChart website ([email or web address]) and click on “Access for My Kids/Family/Friends” below the username and password fields.    Questions?    Call our MyChart Customer Service Center, 8 a.m. to 5 p.m.   Weekdays: [phone]; choose Option 1

## 2021-12-10 NOTE — Progress Notes (Signed)
ADULT POST OP TKR    [name] presents today for a post-op visit following aright total knee arthroplasty performed 07/31/2019.  Patient was last evaluated on 01/23/2020.  He states he does have some ongoing anterior knee pain for quite some time, patient does not recall if he has ever had a pain-free period of time following surgery.  He does have a history of MS.  Rates his overall low-grade knee pain is a 2 out of 10.  Does admit significant knee pain improvement when compared to the preoperative baseline.  He states the knee has a constant "irritation".  He denies swelling, buckling or giving way.  He denies any hip or groin pain.    Exam: Well-developed, well-nourished, healthy-appearing 71year old male in no apparent distress.  Mood and affect appropriate.  Sensorium clear; oriented X 3.  Patient seated comfortably in an examining room chair.  Able to arise independently from a seated position.  Normal cadence gait without observable limp.  Right knee exam demonstrates neutral knee alignment.  Active assisted knee range of motion 0 to 120 degrees.  Diffuse medial and lateral patella facet tenderness to palpation.  The knee joint is stable.  There is no significant soft tissue tenderness.  No intra-articular effusion, warmth or redness present.  Surgical incision well-healed.  Right hip screening exam does show some diminishment of hip internal rotation however no reproducible pain.     Radiographs were obtained and personally reviewed. These show a right total knee arthroplasty with normal alignment.    Impression: Right TKR-07/31/2019     Plan:  The patient's x-ray findings and underlying diagnosis reviewed.  Patient was seen and evaluated by Dr. Noni Saupe as well.  Patient reassured that his components appear to be stable, patient advised there may be some referred hip pathology involved as he does have some mild hip degenerative change.  There also may be some ongoing muscle related issues associated with  his MS.  At this point time we will continue to monitor.  Advised to contact the office should symptoms worsen.  Additional questions invited and answered.    Follow up in 1 year or sooner as needed.      Orders Next Visit: 3 views right knee x-ray at that visit.  Answers for HPI/ROS submitted by the patient on 12/04/2021  What is your goal for today's visit?: [place]  Date of onset: : 10/26/2021  Was this the result of an injury?: No  What is your pain level?: 2/10  Please describe the quality of your pain: : discomfort  What diagnostic workup have you had for this condition?: no prior workup  What treatments have you tried for this condition?: no previous treatments  Progression since onset: : waxing and waning  Is this a work related condition? : No  Current work status: : usual activities  Fever: No  Chills: No  Numbness: Yes  Tingling: Yes

## 2021-12-18 ENCOUNTER — Ambulatory Visit: Payer: Medicare (Managed Care) | Admitting: Primary Care

## 2021-12-18 ENCOUNTER — Encounter: Payer: Self-pay | Admitting: Primary Care

## 2021-12-18 ENCOUNTER — Telehealth: Payer: Self-pay | Admitting: Primary Care

## 2021-12-18 DIAGNOSIS — G35 Multiple sclerosis: Secondary | ICD-10-CM

## 2021-12-18 DIAGNOSIS — Z0000 Encounter for general adult medical examination without abnormal findings: Secondary | ICD-10-CM

## 2021-12-18 DIAGNOSIS — E7800 Pure hypercholesterolemia, unspecified: Secondary | ICD-10-CM

## 2021-12-18 DIAGNOSIS — I77810 Thoracic aortic ectasia: Secondary | ICD-10-CM

## 2021-12-18 DIAGNOSIS — Z23 Encounter for immunization: Secondary | ICD-10-CM

## 2021-12-18 MED ORDER — ATORVASTATIN CALCIUM 40 MG PO TABS *I*
40.0000 mg | ORAL_TABLET | Freq: Every evening | ORAL | 3 refills | Status: DC
Start: 2021-12-18 — End: 2022-10-27

## 2021-12-18 MED ORDER — TETANUS-DIPHTH-ACELL PERT 5-2.5-18.5 LF-MCG/0.5 IM SUSP *WRAPPED*
0.5000 mL | Freq: Once | INTRAMUSCULAR | 0 refills | Status: AC
Start: 2021-12-18 — End: 2021-12-18

## 2021-12-18 NOTE — Telephone Encounter (Signed)
Received the following fax from patients pharmacy:     "[facility] does not have a standing order to administer the boostrix vaccine.  Please electronically send a script for the patient. "    Medication: Boostrix - inject 0.5ML IM once.

## 2021-12-18 NOTE — Patient Instructions (Addendum)
Will start evening dose of 40 mg atorvastatin.  Will repeat lipid profile in 4+ weeks.  Will report any new, intrusive muscle symptoms.    You are due for your 10 year tetanus booster.  Tdap if you plan to be around newborns.  Thank you for completing your Subsequent Annual Medicare Visit and Other   with [place] today.     The purpose of this visits was to:    Screen for disease  Assess risk of future medical problems  Help develop a healthy lifestyle  Update vaccines    Patient Care Team:  Ruby Cola, MD as PCP - General  Gaetana Michaelis, MD as Provider Team (Neurology)  Lennette Bihari Jillene Bucks, MD as Provider Team (Cardiology)  Eli Hose, MD as Provider Team (Ophthalmology)  Courtney Paris, MD as Provider Team (Dermatology)  Myrle Sheng, MD as Provider Team (Urology)  Sophronia Simas, MD as Provider Team (Allergy/Immunology/Rheumatology)     Medicare 5 Year Plan    The following items were identified as areas of concern during your screening today:  None identified - This is great news. You have avoided Smoking, Diabetes, High Blood Pressure (Hypertension) and a high BMI (you are not overweight).       The Health Maintenance table below identifies screening tests and immunizations recommended by your health care team:  Health Maintenance: These screening recommendations are based on USPSTF, [employer], and [place] state guidelines   Topic Date Due    DTaP/Tdap/Td Vaccines (3 - Td or Tdap) 09/09/2020    HIV Screening  01/24/2039 (Originally 02/27/1964)    DEPRESSION SCREEN YEARLY  02/24/2022    Cholesterol Screening  12/03/2022    Fall Risk Screening  12/19/2022    Colon Cancer Screening  04/29/2025    Flu Shot  Completed    Shingles Vaccine  Completed    Hepatitis C Screening  Completed    Pneumococcal Vaccination  Completed    COVID-19 Vaccine  Completed     In addition, goals and orders placed to address these recommendations are listed in the "Today's Visit" section.    We wish you the best of  health and look forward to seeing you again next year for your Annual Medicare Wellness Visit.     If you have any health care concerns before then, please do not hesitate to contact us.

## 2021-12-18 NOTE — Telephone Encounter (Signed)
rx pended

## 2021-12-20 ENCOUNTER — Encounter: Payer: Self-pay | Admitting: Primary Care

## 2021-12-20 NOTE — Progress Notes (Signed)
Visit performed as:             Office Visit, met with patient in person    Today we reviewed and updated [name] smoking status, activities of daily living, depression screen, fall risk, medications and allergies.   I have counseled the patient in the above areas.     Subjective:     Chief Complaint: [name] is a 71 y.o. male here for a/an Subsequent Annual Medicare Visit and Other    In general, [name] rates their overall health as:  fairly good despite multiple sclerosis trend      Patient Care Team:  Ivory Broad, MD as PCP - Maurine Cane, MD as Provider Team (Neurology)  Jimmye Norman, MD as Provider Team (Cardiology)  Virgel Gess, MD as Provider Team (Ophthalmology)  Moss Mc, MD as Provider Team (Dermatology)  [name], MD as Provider Team (Allergy/Immunology/Rheumatology)     Current Outpatient Medications on File Prior to Visit   Medication Sig Dispense Refill    tadalafil (CIALIS) 5 MG tablet Take 1 tablet (5 mg total) by mouth daily 90 tablet 3    dalfampridine (AMPYRA) 10 MG tablet TAKE 1 TABLET TWICE A DAY 60 tablet 11    ocrelizumab (OCREVUS) 300 MG/10ML injection Administer into the vein every 6 months      ascorbic acid (VITAMIN C) 100 MG tablet Take 1 tablet (100 mg total) by mouth daily      cholecalciferol (VITAMIN D) 1000 UNIT tablet Take 1 tablet (1,000 units total) by mouth daily      Cyanocobalamin (VITAMIN B-12 CR PO) Take 1,000 mcg by mouth daily          No current facility-administered medications on file prior to visit.     No Known Allergies (drug, envir, food or latex)  Patient Active Problem List    Diagnosis Date Noted    Multiple sclerosis-Re-enrolled MSPATHS 001, 01/15/20 05/30/2008     Priority: High     Relapsing; remitting, doing well on oral Tecfidera.  Taking coenzyme Q10 for muscle irritability, seems better.        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      Thoracic aortic aneurysm (TAA) 02/22/2009     Priority: Medium     mild- 4.0 cm  by CT scan 5/10  See 6/13 echo at [facility] describing no change, plan for 24 month f/u  See [id number] [facility] note.  See 04/06/16 echo ([facility]) mild.        Hyperlipidemia 05/30/2008     Priority: Medium     Normal TG, just normal HDL, and LDL as high as 150's at times.  No premature CVD in family, mother with CAD late.  Started statin in 2014, supplementing with coenzyme Q10 to minimize muscle side effects.      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     HLD (hyperlipidemia)     Hyperlipidemia 05/30/2008    Normal TG, just normal HDL, and LDL as high as 150's at times. No premature CVD in family, mother with CAD late. Started statin in 2014, supplementing with coenzyme Q10 to minimize muscle side effects.     Incomplete Right Bundle Branch Block 05/30/2008          Multiple sclerosis     Osteoarthritis of right knee 02/01/2014    Psoriasis     S/P R TKA 07/31/2019 07/31/2019    Thoracic aortic aneurysm (TAA) 02/22/2009    mild- 4.0 cm  by CT scan 5/10 See 6/13 echo at [facility] describing no change, plan for 24 month f/u See [id number] [facility] note. See 04/06/16 echo ([facility]) mild.     Tic disorder      Past  Surgical History:   Procedure Laterality Date    COLONOSCOPY      HERNIA REPAIR      HX TONSILLECTOMY/ADENOIDECTOMY      INCISIONAL HERNIA REPAIR      JOINT REPLACEMENT  07/30/2028    Right knee replacement    KNEE ARTHROSCOPY Right     OTHER SURGICAL HISTORY      MS    PR ARTHRP KNE CONDYLE&PLATU MEDIAL&LAT COMPARTMENTS Right 07/31/2019    Procedure: RIGHT TOTAL KNEE ARTHROPLASTY;  Surgeon: Arley Phenix, MD;  Location: [facility] MAIN OR;  Service: Orthopedics    VASECTOMY       Family History   Problem Relation Age of Onset    Heart Disease Mother         coronary art disease, not premature    Cancer Father         multiple myeloma    Cancer Maternal Grandmother         unknown    Heart Disease Brother         aneurism-07-09-18    Multiple Sclerosis Neg Hx     Lupus Neg Hx     Rheum arthritis Neg Hx     Thyroid disease Neg Hx     Diabetes Neg Hx     Colon cancer Neg Hx     Colon polyps Neg Hx     High Blood Pressure Neg Hx      Social History     Socioeconomic History    Marital status: Married   Tobacco Use    Smoking status: Never    Smokeless tobacco: Never   Substance and Sexual Activity    Alcohol use: Not Currently     Comment: Very occasionally    Drug use: Yes     Frequency: 3.0 times per week     Types: Marijuana    Sexual activity: Never   Social History Programmer, 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        Exercise -- doubles tennis or pickle ball up to 5 times weekly, joined [employer] and plans to add some resistance machines.        Diet -- varied, more lean proteins, complex carbs, not a lot of dessert, some greens.  Protein  shakes at bkfst and lunch, flax, seeds, yogurt.        Sleep -- active dreams, variable sleep success.  No GU interruption.        Safety -- seatbelt and no distractions, smoke detectors, sunglasses, plans to be more consistent with skin block.       Objective:     Vital Signs: There were no vitals taken for this visit.   BMI: There is  no height or weight on file to calculate BMI.    Vision Screening Results (Welcome visit only):  No results found.    Depression Screening Results:  Recent Review Flowsheet Data     PHQ Scores 02/24/2021 02/06/2020 07/03/2019 05/17/2019 11/25/2018 11/25/2017 03/16/2017    PSQ2 Q1 - Interest/Pleasure - _0  N    PSQ2 Q2 - Down, Depressed, Hopeless - _1  N    PHQ Calculated Score 0 - - - - - -        Opioid Use/DAST- 10 Screening Results:   How many times in the past year have you used an illegal drug or used a prescription medication for nonmedical reasons?: >14 (12/11/2021  2:24 PM)  DAST-10 Score: 1 (12/11/2021  2:24 PM)    Activities of Daily Living/Functional Screening Results:  Is the person deaf or does he/she have serious difficulty hearing?: N  Is this person blind or does he/she have serious difficulty seeing even when wearing glasses?: N  *Vision Status: Visual aid   Does this person have serious difficulty walking or climbing stairs?: N  Does this person have difficulty dressing or bathing?: N  *Shopping: Independent  *House Keeping: Independent  *Managing Own Medications: Independent  *Handling Finances: Independent  If you need help, who helps you?: wife  Difficulty doing errands due to a physicial, mental or emotional condition: No  Difficulty remembering or making decisions due to a physicial, mental or emotional condition: No      Fall Risk Screening Results:  Have you fallen in the last year?: Yes  Did you sustain an injury which required medical attention?: No  Do you feel you are at risk for falling?: Yes  Do you feel your risk of falling is: : Moderate  Would you like to learn more about how to reduce your risk of falling?: No      Assessment and Plan:     Cognitive Function:  Recall of recent and remote events appears:  Normal      Advanced Care Planning:  was discussed and the paperwork can be found in the scanned media section     The following health maintenance plan was reviewed with the  patient:    Health Maintenance Topics with due status: Overdue       Topic Date Due    IMM DTaP/Tdap/Td 09/09/2020     Health Maintenance Topics with due status: Postponed       Topic Postponed Until    HIV Screening USPSTF/[place] 01/24/2039 (Originally 02/27/1964)     Health Maintenance Topics with due status: Not Due       Topic Last Completion Date    Colon Cancer Screening USPSTF 04/30/2015    DEPRESSION SCREEN YEARLY 02/24/2021    LIPID DISORDER SCREENING 12/03/2021    Fall Risk Screening 12/18/2021     Health Maintenance Topics with due status: Completed       Topic Last Completion Date    IMM Pneumo: 65+ Years 11/05/2016    IMM-ZOSTER  05/07/2017    Hepatitis C Screening USPSTF/[place] 03/30/2020    IMM-INFLUENZA 08/11/2021    COVID-19 Vaccine 08/19/2021     This health maintenance schedule, identified risks, a list of orders placed today and patient goals have been provided to [name] in the after visit summary.     Plan for any concerns identified during screening or risk assessments:  See Patient Instructions.Subjective:      ____________________________________________________     Video Visit     Location of Patient: home    Location of Telemedicine Provider: hospital / clinical location    Other participants in telemedicine encounter and roles:  LPN "rooming    This is an established patient visit.    Reason for visit: Subsequent Annual Medicare Visit and Other      HPI  ___     Patient ID: [name] is a 71 y.o. male.    HPI  Adreyan is also here for follow up of recent lab update.    At his [facility] with PA Alicia Amel in MAY 2022, they noted change in urologist, working with Dr. Cletus Gash on ED challenges.  MS symptoms were more active, and neurologist was closely following, Ampyra plus Ocrevus recently added.  Aortic root and ascending aorta dimensions were noted to be stable on serial [place].  Lipid update was encouraged    Interim history:    NEURO -- monitoring and f/u in place.    [place] for Urology "taking  reins."    [facility] -- TKR OCT 2020 stable at [facility] 2023 ORTHO follow up, perhaps with some referred knee pain from DJD of that hip, conservative measures reviewed.    Had seasonal flu shot, bivalent COVID booster.    Patient's medications, allergies, past medical, surgical, social and family histories were reviewed and updated as appropriate.    Review of Systems  Notes no new exertional sx or altered bowel, bladder function today.    Patient's problem list, allergies, and medications were reviewed and updated as appropriate.  Please see the EHR for full details.    Exam and data reviewed:  He appears engaged, articulate and in NAD.    Component      Latest Ref Rng & Units 12/03/2021 11/24/2021 05/26/2021 04/16/2021   WBC      4.2 - 9.1 THOU/uL  4.7 5.6    RBC      4.6 - 6.1 MIL/uL  4.8 4.8    Hemoglobin      13.7 - 17.5 g/dL  14.8 14.6    Hematocrit      40 - 51 %  42 43    MCV      79 - 92 fL  87 88    MCH      26 - 32 pg  31 30    MCHC      32 - 37 g/dL  35 34    RDW      11.6 - 14.4 %  12.9 12.8    Platelets      150 - 330 THOU/uL  209 194    Seg Neut %      %  60.6 56.7    Lymphocyte %      %  23.7 25.1    Monocyte %      %  11.8 14.4    Eosinophil %      %  2.6 2.9    Basophil %      %  0.9 0.5    Neut # K/uL      1.8 - 5.4 THOU/uL  2.8 3.2    Lymph # K/uL      1.3 - 3.6 THOU/uL  1.1 (L) 1.4    Mono # K/uL      0.3 - 0.8 THOU/uL  0.6 0.8    Eos # K/uL      0.0 - 0.5 THOU/uL  0.1 0.2    Baso # K/uL      0.0 - 0.1 THOU/uL  0.0 0.0    Nucl RBC %      0.0 - 0.2 /100 WBC  0.0 0.0    Nucl RBC # K/uL      0.0 - 0.0 THOU/uL  0.0 0.0    IMM Granulocytes #      0.0 - 0.0 THOU/uL  0.0 0.0    IMM Granulocytes      %  0.4 0.4    Sodium      133 - 145 mmol/L  140 141    Potassium      3.3 - 5.1 mmol/L  4.0 4.3    Chloride      96 - 108 mmol/L  104 104    CO2      20 - 28 mmol/L  28 31 (H)    Anion Gap      7 - _0 (L)    UN      6 - 20 mg/dL  14 17    Creatinine      0.67 - 1.17 mg/dL  1.03 1.03    eGFR BY CREAT      *  77 78     Glucose      60 - 99 mg/dL  72 75    Calcium      8.6 - 10.2 mg/dL  9.3 9.4    Total Protein      6.3 - 7.7 g/dL  5.9 (L) 6.3    Albumin      3.5 - 5.2 g/dL  4.0 4.4    Bilirubin,Total      0.0 - 1.2 mg/dL  0.5 0.8    AST      0 - 50 U/L  38 29    ALT      0 - 50 U/L  31 25    Alk Phos      40 - 130 U/L  77 87    Cholesterol      mg/dL 206 (A)   211 (A)   Triglycerides      mg/dL 128   75   HDL Cholesterol      40 - 60 mg/dL 53   60   LDL Calculated      mg/dL 127   136 (A)   Non HDL Cholesterol      mg/dL 153   151   Chol/HDL Ratio       3.9   3.5       Assessment &   At 71 yo, [name]'s ten-year CV risk is largely driven by fixed factors -- age, heredity.  However, he has opportunity to reduce progression of plaque burden with better control of lipids, discussed.  He asks today about newer lipid-lowering agents, but ultimately is willing to trial atorvastatin.    Plan:    Will start evening dose of 40 mg atorvastatin.  Will repeat lipid profile in 4+ weeks.  Will report any new,  intrusive muscle symptoms.    You are due for your 10 year tetanus booster.  Tdap if you plan to be around newborns.      Consent was obtained from the patient to complete this video visit; including the potential for financial liability.          Ivory Broad, MD

## 2021-12-24 ENCOUNTER — Encounter: Payer: Self-pay | Admitting: Primary Care

## 2021-12-24 NOTE — Telephone Encounter (Signed)
I agree with that plan. 

## 2021-12-24 NOTE — Telephone Encounter (Signed)
I spoke to [name]. He tested positive yesterday for COVID. He is now experiencing symptoms of what he would describe as a bad head cold. Congestion, cough, low grade fever 99. Taking ibuprofen with relief. He is also taking Mucinex. Advised he could also take nighttime cold medicine and NS nasal spray which he did last night. He understands he is in isolation for 5 days post symptom start then if he is afebrile and feels better after day 5 can go out with mask on. Apparently 8 other people in his office are positive.    He declines antiviral He took it last summer when he was positive for COVID and experienced rebound.    I did advise him if he started with worsening symptoms and SOB, coughing that is keeping him up at night please let  us know.

## 2022-01-06 ENCOUNTER — Encounter: Payer: Self-pay | Admitting: Primary Care

## 2022-02-11 ENCOUNTER — Other Ambulatory Visit
Admission: RE | Admit: 2022-02-11 | Discharge: 2022-02-11 | Disposition: A | Discharge status: Home / Self Care | Payer: Medicare (Managed Care) | Source: Ambulatory Visit | Attending: Primary Care | Admitting: Primary Care

## 2022-02-11 DIAGNOSIS — E7800 Pure hypercholesterolemia, unspecified: Secondary | ICD-10-CM | POA: Insufficient documentation

## 2022-02-11 LAB — LIPID PANEL
Chol/HDL Ratio: 2.3
Cholesterol: 130 mg/dL
HDL: 57 mg/dL (ref 40–60)
LDL Calculated: 62 mg/dL
Non HDL Cholesterol: 73 mg/dL
Triglycerides: 57 mg/dL

## 2022-02-13 ENCOUNTER — Encounter: Payer: Self-pay | Admitting: Gastroenterology

## 2022-02-13 ENCOUNTER — Encounter: Payer: Self-pay | Admitting: Neurology

## 2022-02-13 ENCOUNTER — Encounter: Payer: Self-pay | Admitting: Primary Care

## 2022-02-16 ENCOUNTER — Encounter: Payer: Self-pay | Admitting: Neurology

## 2022-02-16 ENCOUNTER — Encounter: Payer: Self-pay | Admitting: Gastroenterology

## 2022-02-16 DIAGNOSIS — G35 Multiple sclerosis: Secondary | ICD-10-CM

## 2022-02-17 NOTE — Telephone Encounter (Signed)
Called and spoke with patient. He has been having increased numbness in feet bilaterally with associated burning pain that is worse at night.  He is also having increased difficulty picking up legs and has to focus more on this otherwise he stumbles.   Pt was previously on gabapentin and would be willing to try again.  He does not think he had any issues with this medication in the past other than it caused some drowsiness.  Would prefer to take this at night if recommended.  He is also asking if he can have a dose of IVMP as this symptom is overly bothersome and he is hoping steroids will offer some improvement.  No recent infections or UTI symptoms.  Will defer to NP pool for recommendations.

## 2022-02-18 ENCOUNTER — Other Ambulatory Visit: Payer: Self-pay | Admitting: Neurology

## 2022-02-18 DIAGNOSIS — G35 Multiple sclerosis: Secondary | ICD-10-CM

## 2022-02-18 MED ORDER — IV CATHETER MISC
0 refills | Status: DC
Start: 2022-02-18 — End: 2022-06-24

## 2022-02-18 MED ORDER — SODIUM CHLORIDE 0.9 % INJ (FLUSH) WRAPPED *I*
10.0000 mL | 0 refills | Status: AC | PRN
Start: 2022-02-18 — End: 2022-02-19

## 2022-02-18 MED ORDER — METHYLPREDNISOLONE SOD SUCC 1000 MG IJ SOLR (125 MG/ML) *WRAPPED*
1000.0000 mg | Freq: Every day | INTRAMUSCULAR | 0 refills | Status: AC
Start: 2022-02-18 — End: 2022-02-19

## 2022-02-18 MED ORDER — GABAPENTIN 100 MG PO CAPSULE *I*
100.0000 mg | ORAL_CAPSULE | Freq: Three times a day (TID) | ORAL | 1 refills | Status: DC | PRN
Start: 2022-02-18 — End: 2022-03-04

## 2022-02-18 NOTE — Telephone Encounter (Signed)
Vivi Barrack, NP is requesting pt be scheduled for IV solumedrol 1000 mg x 1 day.  Referral sent to [facility] home infusion and medication/supply orders sent to Vivi Barrack, NP for signature.

## 2022-02-19 ENCOUNTER — Other Ambulatory Visit: Payer: Self-pay | Attending: Neurology

## 2022-02-19 NOTE — Telephone Encounter (Signed)
Handicap form completed per pt request and signed by NP.  Form sent to pt via MyChart as requested.

## 2022-02-20 ENCOUNTER — Ambulatory Visit: Payer: Medicare (Managed Care)

## 2022-02-20 ENCOUNTER — Other Ambulatory Visit: Payer: Self-pay

## 2022-02-23 ENCOUNTER — Other Ambulatory Visit: Payer: Self-pay | Admitting: Neurology

## 2022-02-23 ENCOUNTER — Other Ambulatory Visit: Payer: Self-pay

## 2022-02-23 DIAGNOSIS — G35 Multiple sclerosis: Secondary | ICD-10-CM

## 2022-02-23 MED ORDER — METHYLPREDNISOLONE SOD SUCC 125 MG IJ SOLR(62.5MG/ML) *WRAPPED*
INTRAMUSCULAR | 0 refills | Status: DC
Start: 2022-02-23 — End: 2022-05-04

## 2022-02-23 MED ORDER — SODIUM CHLORIDE 0.9 % INJ (FLUSH) WRAPPED *I*
10.0000 mL | Status: AC | PRN
Start: 2022-02-23 — End: 2022-02-20
  Administered 2022-02-20: 10 mL

## 2022-02-23 MED ORDER — METHYLPREDNISOLONE SOD SUCC 125 MG IJ SOLR(62.5MG/ML) *WRAPPED*
INTRAMUSCULAR | Status: AC
Start: 2022-02-23 — End: 2022-02-20
  Administered 2022-02-20: 1000 mg

## 2022-02-23 MED ORDER — SODIUM CHLORIDE 0.9 % INJ (FLUSH) WRAPPED *I*
10.0000 mL | 0 refills | Status: DC | PRN
Start: 2022-02-23 — End: 2022-05-04

## 2022-02-23 NOTE — Nursing Note (Signed)
Home infusion visit  Methylprednisolone 1000mg  IV infused over 1 hour. Pt tolerated well with no issues or adverse reactions.

## 2022-02-24 ENCOUNTER — Other Ambulatory Visit: Payer: Self-pay

## 2022-02-25 ENCOUNTER — Other Ambulatory Visit: Payer: Self-pay

## 2022-03-03 ENCOUNTER — Encounter: Payer: Self-pay | Admitting: Neurology

## 2022-03-03 DIAGNOSIS — G35 Multiple sclerosis: Secondary | ICD-10-CM

## 2022-03-04 MED ORDER — GABAPENTIN 100 MG PO CAPSULE *I*
ORAL_CAPSULE | ORAL | 2 refills | Status: DC
Start: 2022-03-04 — End: 2022-05-04

## 2022-03-04 NOTE — Telephone Encounter (Signed)
See patient's MyChart message. Updated prescription for gabapentin and will forward to Vivi Barrack for signature. Patient would like this sent to [facility].

## 2022-03-04 NOTE — Telephone Encounter (Signed)
Will defer pt's MyChart message to NP pool for recommendations.

## 2022-03-22 ENCOUNTER — Other Ambulatory Visit: Payer: Self-pay | Admitting: Adult Health

## 2022-03-22 DIAGNOSIS — G35 Multiple sclerosis: Secondary | ICD-10-CM

## 2022-03-23 ENCOUNTER — Other Ambulatory Visit: Payer: Self-pay | Admitting: Neurology

## 2022-03-23 DIAGNOSIS — G35 Multiple sclerosis: Secondary | ICD-10-CM

## 2022-03-26 ENCOUNTER — Other Ambulatory Visit: Payer: Self-pay | Admitting: Adult Health

## 2022-03-26 DIAGNOSIS — G35 Multiple sclerosis: Secondary | ICD-10-CM

## 2022-05-02 ENCOUNTER — Encounter: Payer: Self-pay | Admitting: Neurology

## 2022-05-02 DIAGNOSIS — G35 Multiple sclerosis: Secondary | ICD-10-CM

## 2022-05-04 ENCOUNTER — Ambulatory Visit: Payer: Medicare (Managed Care) | Admitting: Family Medicine

## 2022-05-04 ENCOUNTER — Encounter: Payer: Self-pay | Admitting: Family Medicine

## 2022-05-04 ENCOUNTER — Other Ambulatory Visit: Payer: Self-pay

## 2022-05-04 VITALS — BP 134/80 | HR 73 | Temp 96.5°F | Wt 176.0 lb

## 2022-05-04 DIAGNOSIS — G35 Multiple sclerosis: Secondary | ICD-10-CM

## 2022-05-04 DIAGNOSIS — M792 Neuralgia and neuritis, unspecified: Secondary | ICD-10-CM

## 2022-05-04 MED ORDER — GABAPENTIN 300 MG PO CAPSULE *I*
300.0000 mg | ORAL_CAPSULE | Freq: Three times a day (TID) | ORAL | 1 refills | Status: DC
Start: 2022-05-04 — End: 2022-06-01

## 2022-05-04 NOTE — Addendum Note (Signed)
Addended byDickie La ALI on: 05/04/2022 12:19 PM     Modules accepted: Level of Service

## 2022-05-04 NOTE — Progress Notes (Signed)
Subjective:   [name] is a 71 y.o. male with   Past Medical History:   Diagnosis Date   . Asthma    . 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     . Enlarged prostate with lower urinary tract symptoms (LUTS) 01/26/2017   . Environmental allergies 05.25.18   . Extrinsic allergic asthma 11/28/2012    Drenda Freeze follows, usually on max therapy in anticipation of bad ragweed season.    Marland Kitchen HLD (hyperlipidemia)    . Hyperlipidemia 05/30/2008    Normal TG, just normal HDL, and LDL as high as 150's at times. No premature CVD in family, mother with CAD late. Started statin in 2014, supplementing with coenzyme Q10 to minimize muscle side effects.    . Incomplete Right Bundle Branch Block 05/30/2008         . Multiple sclerosis    . Osteoarthritis of right knee 02/01/2014   . Psoriasis    . S/P R TKA 07/31/2019 07/31/2019   . Thoracic aortic aneurysm (TAA) 02/22/2009    mild- 4.0 cm  by CT scan 5/10 See 6/13 echo at [facility] describing no change, plan for 24 month f/u See [id number] [facility] note. See 04/06/16 echo ([facility]) mild.    . Tic disorder         Presented to Gerald office today for:  Chief Complaint   [name] presents with   . New [name] Visit       HPI    This is a pleasant 71 year old male with past medical history of noted as above, presented to establish care and follow-up on bilateral lower extremity burning and neuropathic pain.      [name] has a known history of MS for which she gets infusions every 6 months, [name] follows with neurology and immunology at [facility] [place].  [name] says that over Gerald past 3 months Gerald burning has worsened, [name] really cannot feel any sensation and has difficulty in assessing Gerald temperature of water.  Does have a little bit of difficulty with balance although not enough to compromise his gait.  [name] does take gabapentin  200 mg in Gerald morning, 200 mg in Gerald afternoon, and occasionally taking at 100 mg tablet in Gerald afternoon, [name] has discussed this with Gerald neurology department in Gerald past who recommended increasing Gerald gabapentin slightly to see if that makes a difference in his symptoms.  I also recommended Gerald same and advised him to discuss perhaps a switch to Lyrica if Gerald gabapentin is not as effective.  [name] obviously wishes to avoid Gerald side effects of gabapentin and therefore does not wish to take high doses of gabapentin.  I agree with this and recommended a slight bump in Gerald dose to see if it makes any difference.    [name] also has a history of an aortic aneurysm, follows with cardiology and regular ultrasounds and CT scans, per cardiology threshold for surgical intervention is 5 cm for him rather than 5.5 due to family history, [name] denies any abdominal pain denies any chest pain and denies any shortness of breath.      [name] denies any worsening of any other multiple sclerosis related symptoms.      Review of Systems   Constitutional: Negative for activity change, appetite change and fatigue.   HENT:  Negative for congestion and sore throat.    Eyes: Negative for discharge and visual disturbance.   Respiratory: Negative for chest tightness and shortness of breath.    Cardiovascular: Negative for chest pain and leg swelling.   Gastrointestinal: Negative for abdominal pain.   Neurological: Positive for numbness.                 [name] has a   Family History   Problem Relation Age of Onset   . Heart Disease Mother         coronary art disease, not premature   . Cancer Father         multiple myeloma   . Cancer Maternal Grandmother         unknown   . Heart Disease Brother         aneurism-07-09-18   . Multiple Sclerosis Neg Hx    . Lupus Neg Hx    . Rheum arthritis Neg Hx    . Thyroid disease Neg Hx    . Diabetes Neg Hx    . Colon cancer Neg Hx    . Colon polyps Neg Hx    . High Blood Pressure Neg Hx         Objective:   BP 134/80 (BP Location: Left arm, [name] Position: Sitting)   Pulse 73   Temp 35.8 C (96.5 F) (Temporal)   Wt 79.8 kg (176 lb)   SpO2 100%   BMI 24.55 kg/m    BP Readings from Last 3 Encounters:   05/04/22 134/80   02/20/22 102/60   12/10/21 134/80       Physical Exam  Vitals and nursing note reviewed.   Constitutional:       Appearance: Normal appearance. [name] is normal weight.   HENT:      Head: Normocephalic and atraumatic.      Mouth/Throat:      Mouth: Mucous membranes are dry.   Cardiovascular:      Rate and Rhythm: Normal rate and regular rhythm.   Pulmonary:      Effort: Pulmonary effort is normal. No respiratory distress.      Breath sounds: Normal breath sounds. No stridor.   Musculoskeletal:         General: No swelling. Normal range of motion.      Cervical back: Normal range of motion.   Skin:     General: Skin is warm.   Neurological:      General: No focal deficit present.      Mental Status: [name] is alert and oriented to person, place, and time.         Lab Results   Component Value Date    WBC 4.7 11/24/2021    HGB 14.8 11/24/2021    HCT 42 11/24/2021    PLT 209 11/24/2021    CHOL 130 02/11/2022    TRIG 57 02/11/2022    HDL 57 02/11/2022    ALT 31 11/24/2021    AST 38 11/24/2021    NA 140 11/24/2021    K 4.0 11/24/2021    CL 104 11/24/2021    CO2 28 11/24/2021    TSH 3.91 05/19/2019    INR 1.1 07/10/2019     No components found for: CALCIUM, PHOS  No components found for: LDLCALC, LDLCHOLESTEROL, LDLDIRECT    Assessment and Plan:   1. Neuropathic pain  Likely related to MS, recommended increasing gabapentin, [name] takes 100 mg tablets, 200 in Gerald morning  and 200 in Gerald evening with occasional 100 mg afternoon, I recommended increasing to 300 in Gerald morning and 300 in Gerald evening with an occasional 100 in Gerald afternoon, [name] has 100 mg tablets available at home, refills have been requested to his neurologist, [name] has a follow-up appointment scheduled with neurology.    2.  MS (multiple sclerosis)  [name] gets infusion every 6 months, follows with [facility] [place] neurology, at this point recommend continued follow-up with neurology and as [name] is having worsening neuropathic pain as mentioned above, follow-up with neurology in regards to gabapentin, [name] does understand that Gerald neuropathic pain is progressive and may not have any definitive cure, perhaps Lyrica may be beneficial if gabapentin does not work          Requested Prescriptions      No prescriptions requested or ordered in this encounter       There are no discontinued medications.    [name] received counseling on Gerald following healthy behaviors: nutrition, exercise and medication adherence    Discussed use,benefit, and side effects of prescribed medications.  Barriers to medication compliance addressed.      All [name] questions answered.  Pt voiced understanding.     Follow up in about 1 year (around 05/05/2023) for Yearly Check-up.        Disclaimer: Some orall of this note was transcribed using voice-recognition software.This may cause typographical errors occasionally. Although all effort is made to fix these errors, please do not hesitate to contact our office if there isany concern with Gerald understanding of this note.

## 2022-05-04 NOTE — Telephone Encounter (Signed)
Pt is asking for Rx for increased dose of gabapentin that was recommended.  Order pended and sent to NP pool for signature.

## 2022-05-04 NOTE — Telephone Encounter (Signed)
Will defer pt's MyChart message to NP pool for recommendations.

## 2022-05-11 NOTE — Telephone Encounter (Signed)
Will defer pt's MyChart message to Kate Daniels,. NP for recommendations.

## 2022-05-13 ENCOUNTER — Other Ambulatory Visit: Payer: Self-pay

## 2022-05-13 NOTE — Progress Notes (Unsigned)
Need order for ocrevus

## 2022-05-14 ENCOUNTER — Encounter: Payer: Self-pay | Admitting: Neurology

## 2022-05-14 NOTE — Telephone Encounter (Signed)
Called patient in order to schedule follow up appointment. Confirmed appointment for September.

## 2022-05-22 ENCOUNTER — Telehealth: Payer: Self-pay

## 2022-05-22 ENCOUNTER — Other Ambulatory Visit: Payer: Self-pay

## 2022-05-22 NOTE — Telephone Encounter (Signed)
Jayvonn is still interested and comfortable coming in for treatment.  I completed the COVID-19 screen, patient confirmed that they have not had any exposure and does not have any symptoms.  I explained that we are taking extra precautions and using some social distancing practices and patient knows to call if they start to develop symptoms or learns that they have come in contact with a confirmed case.    Do you feel like you could have an infection or are you taking antibiotics for an infection? no    Confirmed with patient that they have had no insurance changes   04/22/2020   [facility] PRIOR AUTH    Prior Authorization Needed No    Treatment/Medication Name Ocrevus 300mg  day 1 & 15, 600mg  every 6 months    Date of Service    Authorization Type Medication    CPT Code J2350    Diagnosis Code G35    Physician Name and NPI Gaetana Michaelis    Service Location [facility]    Prior Authorization Status Authorization not required    Request Date    Authorization Number    Authorization Start Date    Authorization End Date    Insurance Company Name [employer]    Policy Number [id number]    Comments No PA required per [employer] policy effective 11/13/19    Is this related to workers comp or MVA? No

## 2022-05-25 ENCOUNTER — Ambulatory Visit: Payer: Medicare (Managed Care)

## 2022-05-25 ENCOUNTER — Other Ambulatory Visit: Payer: Self-pay

## 2022-05-25 ENCOUNTER — Other Ambulatory Visit: Payer: Self-pay | Admitting: Gastroenterology

## 2022-05-25 ENCOUNTER — Telehealth: Payer: Self-pay

## 2022-05-25 VITALS — BP 118/77 | HR 56 | Temp 98.1°F | Resp 16

## 2022-05-25 DIAGNOSIS — G35 Multiple sclerosis: Secondary | ICD-10-CM | POA: Insufficient documentation

## 2022-05-25 LAB — COMPREHENSIVE METABOLIC PANEL
ALT: 37 U/L (ref 0–50)
Albumin: 4.2 g/dL (ref 3.5–5.2)
Alk Phos: 83 U/L (ref 40–130)
Anion Gap: 13 (ref 7–16)
Bilirubin,Total: 0.9 mg/dL (ref 0.0–1.2)
CO2: 23 mmol/L (ref 20–28)
Calcium: 9.2 mg/dL (ref 8.6–10.2)
Chloride: 104 mmol/L (ref 96–108)
Creatinine: 0.93 mg/dL (ref 0.67–1.17)
Glucose: 125 mg/dL — ABNORMAL HIGH (ref 60–99)
Lab: 18 mg/dL (ref 6–20)
Sodium: 140 mmol/L (ref 133–145)
Total Protein: 5.8 g/dL — ABNORMAL LOW (ref 6.3–7.7)
eGFR BY CREAT: 87 *

## 2022-05-25 LAB — LYMPHOCYTE SUBSET (T & B CELLS)
B LYM #(CD19): 0 cells/uL — ABNORMAL LOW (ref 120–725)
B LYM %(CD19): 0 % — ABNORMAL LOW (ref 7–36)
CD4#: 696 cells/uL (ref 496–2186)
CD4%: 59 % (ref 32–71)
CD4/CD8: 3.3 — ABNORMAL HIGH (ref 0.7–3.0)
CD8#: 209 cells/uL (ref 177–1137)
NK LYM #(CD16+56): 259 cells/uL (ref 37–758)
NK LYM %(CD16+56): 22 % (ref 4–26)
NK LYM# (CD3+16+56+): 39 cells/uL
NK LYM% (CD3+16+56+): 3 %
T LYM #(CD3): 898 cells/uL (ref 754–2810)
T LYM %(CD3): 76 % (ref 54–87)
T Suppress %(CD8): 18 % (ref 10–38)

## 2022-05-25 LAB — CBC AND DIFFERENTIAL
Baso # K/uL: 0 10*3/uL (ref 0.0–0.1)
Basophil %: 0.8 %
Eos # K/uL: 0.1 10*3/uL (ref 0.0–0.5)
Eosinophil %: 2.1 %
Hematocrit: 43 % (ref 40–51)
Hemoglobin: 14.3 g/dL (ref 13.7–17.5)
IMM Granulocytes #: 0 10*3/uL (ref 0.0–0.0)
IMM Granulocytes: 0.2 %
Lymph # K/uL: 1.2 10*3/uL — ABNORMAL LOW (ref 1.3–3.6)
Lymphocyte %: 22.6 %
MCH: 29 pg (ref 26–32)
MCHC: 33 g/dL (ref 32–37)
MCV: 88 fL (ref 79–92)
Mono # K/uL: 0.6 10*3/uL (ref 0.3–0.8)
Monocyte %: 12.3 %
Neut # K/uL: 3.2 10*3/uL (ref 1.8–5.4)
Nucl RBC # K/uL: 0 10*3/uL (ref 0.0–0.0)
Nucl RBC %: 0 /100 WBC (ref 0.0–0.2)
Platelets: 184 10*3/uL (ref 150–330)
RBC: 4.9 MIL/uL (ref 4.6–6.1)
RDW: 12.9 % (ref 11.6–14.4)
Seg Neut %: 62 %
WBC: 5.1 10*3/uL (ref 4.2–9.1)

## 2022-05-25 LAB — IGA: IgA: 146 mg/dL (ref 70–400)

## 2022-05-25 LAB — IGM: IgM: 171 mg/dL (ref 40–230)

## 2022-05-25 LAB — IGG: IgG: 697 mg/dL — ABNORMAL LOW (ref 700–1600)

## 2022-05-25 MED ORDER — CETIRIZINE HCL 10 MG PO TABS *I*
10.0000 mg | ORAL_TABLET | Freq: Once | ORAL | Status: AC
Start: 2022-05-25 — End: 2022-05-25
  Administered 2022-05-25: 10 mg via ORAL
  Filled 2022-05-25: qty 1

## 2022-05-25 MED ORDER — DIPHENHYDRAMINE HCL 25 MG ORAL SOLID *WRAPPED*
25.0000 mg | Freq: Once | ORAL | Status: AC
Start: 2022-05-25 — End: 2022-05-25
  Administered 2022-05-25: 25 mg via ORAL
  Filled 2022-05-25: qty 1

## 2022-05-25 MED ORDER — SODIUM CHLORIDE 0.9 % IV SOLN WRAPPED *I*
600.0000 mg | Freq: Once | Status: AC
Start: 2022-05-25 — End: 2022-05-25
  Administered 2022-05-25: 600 mg via INTRAVENOUS
  Filled 2022-05-25: qty 20

## 2022-05-25 MED ORDER — ACETAMINOPHEN 325 MG PO TABS *I*
975.0000 mg | ORAL_TABLET | Freq: Once | ORAL | Status: AC
Start: 2022-05-25 — End: 2022-05-25
  Administered 2022-05-25: 975 mg via ORAL
  Filled 2022-05-25: qty 3

## 2022-05-25 MED ORDER — SODIUM CHLORIDE 0.9 % IV SOLN WRAPPED *I*
50.0000 mL/h | Status: DC | PRN
Start: 2022-05-25 — End: 2022-05-25
  Administered 2022-05-25 (×2): 50 mL/h via INTRAVENOUS

## 2022-05-25 MED ORDER — METHYLPREDNISOLONE SOD SUCC 125 MG IJ SOLR(62.5MG/ML) *WRAPPED*
125.0000 mg | Freq: Once | INTRAMUSCULAR | Status: AC
Start: 2022-05-25 — End: 2022-05-25
  Administered 2022-05-25: 125 mg via INTRAVENOUS
  Filled 2022-05-25: qty 2

## 2022-05-25 NOTE — Telephone Encounter (Signed)
Called patient to schedule next ocrevus infusion. Patient will be in [place] from January to April and would like to know if there is any possibility of getting infusion around the [facility] area for his next infusion so patient doesn't have to fly back. Patient scheduled for 12/02/22. Please advise.

## 2022-05-25 NOTE — Patient Instructions (Signed)
Please reach out to the clinic with any additional questions or concerns. We encourage you to sign up and use MyChart for any non-urgent medical questions as MyChart is the preferred method of communication.  Please contact your pharmacy for medication refills and allow up to 2 business days for routine prescription refills.  For urgent messages, please call the clinic at [phone].  The clinic is open from 8am to 4:30pm Monday through Friday.    You can get [facility] appointment reminders by text - please make sure we have your cell phone number on file at check in/check-out.  Then you can just text [facility] to [phone] to receive appointment messages.         Continue with your usual medications as prescribed.    Ocrevus can increase your risk of respiratory, skin, and herpes infections, please call [phone] with any of the following:    Fevers/chills  Cough that will not go away  Signs and symptoms of respiratory infections  Cold sores/shingles/genital sores  Itching/rashes/hives     Side effects can occur up to a week after infusion.  If you experience body aches or low grade fever you can take Tylenol or Ibuprofen as prescribed. If you experience a rash or itching you can take Benadryl or any other allergy medicine you may have as prescribed. Please call the patient line at [phone] if symptoms don't go away.    You may have more chance of getting an infection. Wash hands often. Stay away from people with infections, colds, or flu. Continue to wear your mask out in public. It is highly recommended that you get a COVID-19 vaccine and booster. People who are fully vaccinated on this therapy have gotten COVID-19. If you think you may have COVID-19 get tested right away. If you are positive you may qualify for medications to lessen the severity of COVID-19. There is a limited time frame these medications can be administered so please get tested as soon as possible and let your neurologist know of a  positive test result.    You should not receive any live vaccines while on Ocrevus.  Please check with your MD first. Be sure to have regular breast exams. Your doctor will tell you know how often to have these. You will also need to do breast self-exams as your doctor has told you.  Women should avoid pregnancy while taking this medication. Let your Doctor know right away if you become pregnant.    Schedule a follow-up with your provider as close to your infusion appointment as possible but within 4 -6 months after your infusion.  Contact Neurology Scheduling for assistance at [phone] and listen for the prompts.    If you have an insurance changes, please notify the office at [phone] as benefits must be verified.

## 2022-05-25 NOTE — Progress Notes (Signed)
Pt arrives for Ocrevus infusion #6 . Denies recent illness, infection or fever. Ocrevus infused per rapid protocol. Pt tolerated infusion well, and was discharged home stable. Reviewed discharge instructions and AVS.

## 2022-05-26 DIAGNOSIS — G35 Multiple sclerosis: Secondary | ICD-10-CM

## 2022-05-27 ENCOUNTER — Telehealth: Payer: Self-pay | Admitting: Neurology

## 2022-05-27 LAB — RESOLUTION

## 2022-05-27 NOTE — Telephone Encounter (Signed)
I agree. Could you please let the pt know that we cannot prescribe Ocrevus infusions at outside facilities. He would need to have his infusion prescribed by a locally neurologist if he would like it to occur in [place]. Thanks

## 2022-05-27 NOTE — Telephone Encounter (Signed)
[name], at [facility] calling in stating the AST and Potassium labs for this patient was drawn on August 14th.    FYI

## 2022-05-28 ENCOUNTER — Encounter: Payer: Self-pay | Admitting: Gastroenterology

## 2022-06-01 ENCOUNTER — Ambulatory Visit: Payer: Medicare (Managed Care) | Admitting: Family Medicine

## 2022-06-01 ENCOUNTER — Encounter: Payer: Self-pay | Admitting: Family Medicine

## 2022-06-01 ENCOUNTER — Other Ambulatory Visit: Payer: Self-pay

## 2022-06-01 VITALS — BP 100/70 | HR 67 | Temp 96.8°F | Wt 173.8 lb

## 2022-06-01 DIAGNOSIS — T148XXA Other injury of unspecified body region, initial encounter: Secondary | ICD-10-CM

## 2022-06-01 NOTE — Progress Notes (Signed)
Subjective:   [name] is a 71 y.o. male with   Past Medical History:   Diagnosis Date   ? Asthma    ? 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     ? Enlarged prostate with lower urinary tract symptoms (LUTS) 01/26/2017   ? Environmental allergies 05.25.18   ? Extrinsic allergic asthma 11/28/2012    [name] follows, usually on max therapy in anticipation of bad ragweed season.    ? HLD (hyperlipidemia)    ? Hyperlipidemia 05/30/2008    Normal TG, just normal HDL, and LDL as high as 150's at times. No premature CVD in family, mother with CAD late. Started statin in 2014, supplementing with coenzyme Q10 to minimize muscle side effects.    ? Incomplete Right Bundle Branch Block 05/30/2008         ? Multiple sclerosis    ? Osteoarthritis of right knee 02/01/2014   ? Psoriasis    ? S/P R TKA 07/31/2019 07/31/2019   ? Thoracic aortic aneurysm (TAA) 02/22/2009    mild- 4.0 cm  by CT scan 5/10 See 6/13 echo at [facility] describing no change, plan for 24 month f/u See [id number] [facility] note. See 04/06/16 echo ([facility]) mild.    ? Tic disorder         Presented to the office today for:  Chief Complaint   Patient presents with   ? Bleeding/Bruising     On right rib area, after hitting golf balls, denies injury       HPI    [name] came today to visit to discuss bruising of his right flank, says that he went golfing last week on Saturday, about 8 days ago, over the next few days he started developing pain on his right flank, overlying bruising, tenderness, this has persisted till today.  He had a concierge health service do a home ultrasound and was unremarkable a few days ago.  He also had lab work done 2 days after the incident for routine medical work, those labs included hemoglobin and platelet count were normal limits.    He says that the pain has persisted on  the right side, however no kidney stones or anything were noted on that ultrasound.    It is tender to touch still, has overlying nearly 2 cm of black and blue bruise.    No other signs of bleeding, no history of anticoagulation use.    Advised him to continue to keep an eye on it for another few weeks and if it does worsen we will get further imaging.      Review of Systems   Constitutional: Negative for activity change, fatigue and unexpected weight change.   Eyes: Negative for visual disturbance.   Respiratory: Negative for shortness of breath.    Cardiovascular: Negative for chest pain and leg swelling.   Gastrointestinal: Negative for abdominal pain.   Genitourinary: Positive for flank pain. Negative for difficulty urinating.   Musculoskeletal: Negative for arthralgias.   Neurological: Negative for headaches.   Hematological:        Bruise/hematoma of right flank   Psychiatric/Behavioral: Negative for dysphoric mood.                 The patient has a   Family History   Problem Relation Age of Onset   ?  Heart Disease Mother         coronary art disease, not premature   ? Cancer Father         multiple myeloma   ? Cancer Maternal Grandmother         unknown   ? Heart Disease Brother         aneurism-07-09-18   ? Multiple Sclerosis Neg Hx    ? Lupus Neg Hx    ? Rheum arthritis Neg Hx    ? Thyroid disease Neg Hx    ? Diabetes Neg Hx    ? Colon cancer Neg Hx    ? Colon polyps Neg Hx    ? High Blood Pressure Neg Hx        Objective:   BP 100/70 (BP Location: Left arm, Patient Position: Sitting)   Pulse 67   Temp 36 ?C (96.8 ?F) (Temporal)   Wt 78.8 kg (173 lb 12.8 oz)   SpO2 98%   BMI 24.24 kg/m?    BP Readings from Last 3 Encounters:   06/01/22 100/70   05/25/22 118/77   05/04/22 134/80       Physical Exam  Constitutional:       General: He is not in acute distress.     Appearance: He is not ill-appearing.   HENT:      Head: Normocephalic and atraumatic.   Cardiovascular:      Rate and Rhythm: Normal rate and  regular rhythm.   Pulmonary:      Effort: Pulmonary effort is normal.      Breath sounds: Normal breath sounds.   Abdominal:      Tenderness: There is abdominal tenderness.      Comments: Right flank tenderness and overlying bruise   Neurological:      General: No focal deficit present.      Mental Status: He is oriented to person, place, and time.         Lab Results   Component Value Date    WBC 5.1 05/25/2022    HGB 14.3 05/25/2022    HCT 43 05/25/2022    PLT 184 05/25/2022    CHOL 130 02/11/2022    TRIG 57 02/11/2022    HDL 57 02/11/2022    ALT 37 05/25/2022    AST CANCELED 05/25/2022    NA 140 05/25/2022    K CANCELED 05/25/2022    CL 104 05/25/2022    CO2 23 05/25/2022    TSH 3.91 05/19/2019    INR 1.1 07/10/2019     No components found for: CALCIUM, PHOS  No components found for: LDLCALC, LDLCHOLESTEROL, LDLDIRECT    Assessment and Plan:     1. Hematoma  -Hematoma likely etiology playing golf perhaps sprain and rupture of capillaries, tender area, will monitor over the next few weeks and if worsens we will get CT scan of the abdomen            Requested Prescriptions      No prescriptions requested or ordered in this encounter       There are no discontinued medications.    [name] received counseling on the following healthy behaviors: nutrition, exercise and medication adherence    Discussed use,benefit, and side effects of prescribed medications.  Barriers to medication compliance addressed.      All patient questions answered.  Pt voiced understanding.     Follow up in about 3 months (around 09/01/2022).        Disclaimer:  Some orall of this note was transcribed using voice-recognition software.This may cause typographical errors occasionally. Although all effort is made to fix these errors, please do not hesitate to contact our office if there isany concern with the understanding of this note.

## 2022-06-02 MED ORDER — PREGABALIN 75 MG PO CAPS *I*
75.0000 mg | ORAL_CAPSULE | Freq: Two times a day (BID) | ORAL | 3 refills | Status: DC
Start: 2022-06-02 — End: 2022-06-02

## 2022-06-02 MED ORDER — PREGABALIN 75 MG PO CAPS *I*
75.0000 mg | ORAL_CAPSULE | Freq: Two times a day (BID) | ORAL | 3 refills | Status: DC
Start: 2022-06-02 — End: 2022-06-24

## 2022-06-02 NOTE — Addendum Note (Signed)
Addended by: Estill Bamberg on: 06/02/2022 04:05 PM     Modules accepted: Orders

## 2022-06-02 NOTE — Addendum Note (Signed)
Addended byGaetana Michaelis on: 06/02/2022 02:39 PM     Modules accepted: Orders

## 2022-06-24 ENCOUNTER — Encounter: Payer: Self-pay | Admitting: Neurology

## 2022-06-24 ENCOUNTER — Other Ambulatory Visit: Payer: Self-pay

## 2022-06-24 ENCOUNTER — Ambulatory Visit: Payer: Medicare (Managed Care) | Attending: Neurology | Admitting: Neurology

## 2022-06-24 VITALS — BP 117/74 | HR 62 | Temp 97.5°F | Wt 173.0 lb

## 2022-06-24 DIAGNOSIS — G35 Multiple sclerosis: Secondary | ICD-10-CM | POA: Insufficient documentation

## 2022-06-24 NOTE — Patient Instructions (Signed)
Please reach out to the clinic with any additional questions or concerns. We encourage you to sign up and use MyChart for any non-urgent medical questions as MyChart is the preferred method of communication.  Please contact your pharmacy for medication refills and allow up to 2 business days for routine prescription refills.  For urgent messages, please call the clinic at [phone].  The clinic is open from 8am to 4:30pm Monday through Friday.    You can get [facility] appointment reminders by text - please make sure we have your cell phone number on file at check in/check-out.  Then you can just text [facility] to [phone] to receive appointment messages.       Considering getting in the next month or so vaccines for: flu, COVID, RSV (check with your PCP).

## 2022-06-24 NOTE — Progress Notes (Signed)
Multiple Sclerosis Clinic Follow-up Visit    Subjective:  [name] is a 71 y.o. 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     Catching the foot, especially L foot when not focusing. For distance, uses walking sticks with benefit. Was carrying objects during recent fall.    Playing golf. Will be swimming in [place], will spend 4 months there working remotely.     Ampyra ongoing.     Working with Urology, to address dribbling symptoms.    Feet are tingly and somewhat uncomfortable. Lyrica 75mg  BID some benefit, but not sufficient. Well tolerated.    Plans to get flu vaccine, COVID vaccine,     8/23 Ocrevus infusion well tolerated.    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    On a 1 story home, other than home office in basement. His [name] office is on the 1st floor at work.      Plans to restart rowing machine use.    MS History:  Clinical presentations:  1990's R arm weakness  Later numbness hands and feet and stereotyped abnormal distal R thigh sensation  MRIs:  4/08 brain-relatively mild lesion burden, 1 enhancing lesion at the time  5/13 brain-no new lesions  11/14 brain-unchanged  10/16 brain-unchanged  4/18 brain-unchanged                 Thoracic-few, non-enhancing intramedullary lesions  12/20 brain-unchanged                  Thoracic cord- at least 3 new intramedullary lesions (no contrast given)  6/21 brain and thoracic cord-unchanged  10/22: brain unchanged  9/23 brain and cervical-scheduled  Other testing:  12/20 JC index 0.33, indeterminate to negative  6/21 natalizumab antibody +  8/23 B cells 0  Disease modifying treatment hx:  Betaseron started in 1990's, stopped due to injection site reactions/infections  Tecfidera started 5/13, stopped due to new thoracic cord lesions  1/21 Tysabri started, stopped due to +natalizumab antibody  Ocrevus started 7/21 and continued through the present  Symptomatic treatment hx:  ED-Cialis  helps  Foot cramps-baclofen can be helpful  Stereotyped R thigh abnormal sensation-less bothersome, not certain if Tegretol helped  Knee pain-acupuncture  Gait-Ampyra, gait worsened when it was stopped. PT less helpful in the past. 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 Lives with his wife. Has 3 children. Never smoker. [place] in [place]. Enjoys golf and swimming.     Meds:  Current Outpatient Medications on File Prior to Visit   Medication Sig Dispense Refill    pregabalin (LYRICA) 75 mg capsule Take 1 capsule (  75 mg total) by mouth 2 times daily  Max daily dose: 150 mg 60 capsule 3    dalfampridine (AMPYRA) 10 MG tablet TAKE 1 TABLET TWICE A DAY 60 tablet 5    IV Catheter MISC Insert 22g peripheral IV and leave in place. Saline lock for duration of infusion and discontinue upon completion of infusion course. 2 each 0    atorvastatin (LIPITOR) 40 mg tablet Take 1 tablet (40 mg total) by mouth nightly 90 tablet 3    tadalafil (CIALIS) 5 MG tablet Take 1 tablet (5 mg total) by mouth daily 90 tablet 3    ocrelizumab (OCREVUS) 300 MG/10ML injection Administer into the vein every 6 months      ascorbic acid (VITAMIN C) 100 MG tablet Take 1 tablet (100 mg total) by mouth daily      cholecalciferol (VITAMIN D) 1000 UNIT tablet Take 1 tablet (1,000 units total) by mouth daily      Cyanocobalamin (VITAMIN B-12 CR PO) Take 1,000 mcg by mouth daily          No  current facility-administered medications on file prior to visit.        ROS:  10+ROS reviewed. Pertinent positives and negatives in HPI.     Vitals  Blood pressure 117/74, pulse 62, temperature 36.4 ?C (97.5 ?F), weight 78.5 kg (173 lb), SpO2 97 %.    PE:  Gen: Well appearing, NAD.  MS: Alert. Fluent. Affect appropriate for situation.  CN:   Choppy pursuits. Full facial strength. No dysarthria.  Motor: No pronator drift. No abnormal movements. FE 5/5, HF 4/4, ADF 4/4.   Sens: Decreased LT LLE.  Coord: No ataxia on FTN. Finger taps normal.   Gait: Mildly wide-based spastic gait,somewhat imbalanced, lifts knees higher than needed to avoid catching feet.    25 ft walk 6.4 sec without assist device.       Assesment and Plan:  71 y.o. M with progressive MS with activity with some worsening of gait/leg strength. Tolerates Ocrevus, so will continue this to prevent activity, although will not fully address the progressive aspect of the MS. Symptomatically will continue with Ampyra (PT not felt to be helpful) and regular exercise.     For neuropathic foot pain, will increase Lyrica to 75/150mg  dosing.    Upcoming MRIs, will send script for Valium to help with MRI associated anxiety, as long as he has a driver.     Plan:  - continue Ocrevus  - labs q65mo with infusions  - Yearly MRIs  - Ongoing exercise and use of Ampyra for symptomatic tx  - FUA in 6 months      Lyah Millirons, MD  Neuroimmunology        36 minutes spent with patient (>50% spent in counseling and coordination of care), in review of the chart on the same day as the visit.

## 2022-06-25 MED ORDER — DIAZEPAM 5 MG PO TABS *I*
ORAL_TABLET | ORAL | 0 refills | Status: DC
Start: 2022-06-25 — End: 2023-02-22

## 2022-06-25 MED ORDER — PREGABALIN 75 MG PO CAPS *I*
ORAL_CAPSULE | ORAL | 3 refills | Status: DC
Start: 2022-06-25 — End: 2023-02-22

## 2022-07-01 ENCOUNTER — Other Ambulatory Visit: Payer: Self-pay

## 2022-07-01 ENCOUNTER — Ambulatory Visit
Admission: RE | Admit: 2022-07-01 | Discharge: 2022-07-01 | Disposition: A | Discharge status: Home / Self Care | Payer: Medicare (Managed Care) | Source: Ambulatory Visit

## 2022-07-01 ENCOUNTER — Ambulatory Visit: Payer: Medicare (Managed Care) | Admitting: Neurology

## 2022-07-01 ENCOUNTER — Ambulatory Visit
Admission: RE | Admit: 2022-07-01 | Discharge: 2022-07-01 | Disposition: A | Discharge status: Home / Self Care | Payer: Medicare (Managed Care) | Source: Ambulatory Visit | Attending: Neurology | Admitting: Neurology

## 2022-07-01 DIAGNOSIS — G35 Multiple sclerosis: Secondary | ICD-10-CM

## 2022-07-01 DIAGNOSIS — M47812 Spondylosis without myelopathy or radiculopathy, cervical region: Secondary | ICD-10-CM | POA: Insufficient documentation

## 2022-07-03 ENCOUNTER — Encounter: Payer: Self-pay | Admitting: Family Medicine

## 2022-07-17 ENCOUNTER — Encounter: Payer: Self-pay | Admitting: Neurology

## 2022-08-04 ENCOUNTER — Encounter: Payer: Self-pay | Admitting: Neurology

## 2022-10-01 ENCOUNTER — Other Ambulatory Visit: Payer: Self-pay | Admitting: Neurology

## 2022-10-01 DIAGNOSIS — G35 Multiple sclerosis: Secondary | ICD-10-CM

## 2022-10-01 MED ORDER — DALFAMPRIDINE 10 MG PO TB12 *I*
10.0000 mg | ORAL_TABLET | Freq: Two times a day (BID) | ORAL | 5 refills | Status: DC
Start: 2022-10-01 — End: 2023-04-08

## 2022-10-01 NOTE — Telephone Encounter (Signed)
Date of last visit and plan for FUV: 06/24/22  Date of FUV? 02/24/23  Date of last MRI and plan for next: 07/01/22  Is MRI ordered? None ordered   DMT (including alternative dosing if applicable):    Labs:      Lab results: 05/25/22  1316   Sodium 140   Potassium CANCELED   Chloride 104   CO2 23   UN 18   Creatinine 0.93   Glucose 125*   Calcium 9.2           Lab results: 05/25/22  1316   Total Protein 5.8*   Albumin 4.2   ALT 37   AST CANCELED   Alk Phos 83   Bilirubin,Total 0.9           Lab results: 05/25/22  1316   WBC 5.1   Hemoglobin 14.3   Hematocrit 43   RBC 4.9   Platelets 184   Neut # K/uL 3.2   Lymph # K/uL 1.2*   Mono # K/uL 0.6   Eos # K/uL 0.1   Baso # K/uL 0.0   Seg Neut % 62.0   Lymphocyte % 22.6   Monocyte % 12.3   Eosinophil % 2.1   Basophil % 0.8

## 2022-10-01 NOTE — Telephone Encounter (Signed)
Rx  Refill  Request    

## 2022-10-23 ENCOUNTER — Telehealth: Payer: Self-pay | Admitting: Family Medicine

## 2022-10-23 NOTE — Telephone Encounter (Signed)
Medication: atorvastatin (LIPITOR) 40 mg tablet   Pt requesting a new script for a 90 day supply. Pt would like the script to be mailed to his home as he is currently fin [place] for the winter.  Any issues, pt requesting a call back.

## 2022-10-23 NOTE — Telephone Encounter (Signed)
So where am I sending the prescription? What pharmacy?

## 2022-10-27 ENCOUNTER — Other Ambulatory Visit: Payer: Self-pay | Admitting: Family Medicine

## 2022-10-27 MED ORDER — ATORVASTATIN CALCIUM 40 MG PO TABS *I*
40.0000 mg | ORAL_TABLET | Freq: Every evening | ORAL | 3 refills | Status: DC
Start: 2022-10-27 — End: 2023-10-15

## 2022-10-27 NOTE — Telephone Encounter (Signed)
Last office visit:   06/01/2022  Patients upcoming appointments:  Future Appointments   Date Time Provider Department Center   12/02/2022  8:00 AM [facility] None   02/22/2023  8:40 AM Kriste Basque, Gaye Pollack, MD [facility] None   02/24/2023  8:30 AM Gaetana Michaelis, MD [facility] None     Recent Lab results:  GENERAL CHEMISTRY   Recent Labs     05/25/22  1316 11/24/21  0826   NA 140 140   K CANCELED 4.0   CL 104 104   CO2 23 28   GAP 13 8   UN 18 14   CREAT 0.93 1.03   GLU 125* 72   CA 9.2 9.3      LIPID PROFILE   Recent Labs     02/11/22  0845 12/03/21  0819   CHOL 130 206*   TRIG 57 128   HDL 57 53   LDLC 62 127      LIVER PROFILE   Recent Labs     05/25/22  1316 11/24/21  0826   ALT 37 31   AST CANCELED 38   ALK 83 77   TB 0.9 0.5      DIABETES THYROID   No value within the past 365 days No value within the past 365 days      Pending/Orders Labs:  Lab Frequency Next Occurrence   Lymphocyte subset (T & B cells) Once 02/18/2022   Renal function panel Once 03/23/2022

## 2022-11-02 ENCOUNTER — Other Ambulatory Visit: Payer: Self-pay | Admitting: Family Medicine

## 2022-11-02 NOTE — Telephone Encounter (Signed)
Medication:     tadalafil (CIALIS) 5 MG tablet 90 tablet 3 10/02/2021 --    Sig - Route: Take 1 tablet (5 mg total) by mouth daily - Oral      Send to:     [id number] [place], [place] - [address] AT [place] [address]     Additional message: N/A    Last office visit:   06/01/2022  Last telemedicine visit:  Visit date not found  Last PA office visit:  Visit date not found  Patients upcoming appointments:  Future Appointments   Date Time Provider Department Center   12/02/2022  8:00 AM [facility] None   02/22/2023  8:40 AM Kriste Basque, Gaye Pollack, MD [facility] None   02/24/2023  8:30 AM Gaetana Michaelis, MD [facility] None     Recent Lab results:  GENERAL CHEMISTRY   Recent Labs     05/25/22  1316 11/24/21  0826   NA 140 140   K CANCELED 4.0   CL 104 104   CO2 23 28   GAP 13 8   UN 18 14   CREAT 0.93 1.03   GLU 125* 72   CA 9.2 9.3      LIPID PROFILE   Recent Labs     02/11/22  0845 12/03/21  0819   CHOL 130 206*   TRIG 57 128   HDL 57 53   LDLC 62 127      LIVER PROFILE   Recent Labs     05/25/22  1316 11/24/21  0826   ALT 37 31   AST CANCELED 38   ALK 83 77   TB 0.9 0.5      DIABETES THYROID   No value within the past 365 days No value within the past 365 days      Pending/Orders Labs:  Lab Frequency Next Occurrence   Lymphocyte subset (T & B cells) Once 02/18/2022   Renal function panel Once 03/23/2022

## 2022-11-03 MED ORDER — TADALAFIL 5 MG PO TABS *I*
5.0000 mg | ORAL_TABLET | Freq: Every day | ORAL | 0 refills | Status: DC
Start: 2022-11-03 — End: 2023-02-01

## 2022-11-10 NOTE — Telephone Encounter (Signed)
Patient called stating that he does not use [facility] and is requesting Provider send script to the following pharmacy:    [facility] [address]   [place], [place]

## 2022-11-21 ENCOUNTER — Encounter: Payer: Self-pay | Admitting: Neurology

## 2022-11-23 ENCOUNTER — Other Ambulatory Visit: Payer: Self-pay

## 2022-11-23 NOTE — Progress Notes (Unsigned)
Need order for ocrevus

## 2022-11-24 NOTE — Telephone Encounter (Signed)
Pt is requesting to push out infusion until May currently scheduled for 2/21. See MyChart message for further details.  Will defer to Dr. Ardelia Mems for recommendations.

## 2022-11-25 ENCOUNTER — Inpatient Hospital Stay: Admit: 2022-11-25 | Discharge: 2022-11-25 | Disposition: A | Discharge status: Home / Self Care | Payer: Self-pay

## 2022-11-25 IMAGING — MR MRI LUMBAR SPINE WITHOUT CONTRAST
6 of 9 series · 16 of 48 positions shown · IV contrast (gadolinium)
Comparison: Lumbar spine x-ray November 20, 2022.

________________________________________________________________________________________________ 
MRI LUMBAR SPINE WITHOUT CONTRAST, 11/25/2022 [DATE]: 
CLINICAL INDICATION: Low back pain radiates to the right hip and down the right 
leg. No trauma.
TECHNIQUE: Multiplanar, multiecho position MR images of the lumbar spine were 
performed without intravenous gadolinium enhancement. Patient was scanned on a 
1.5T magnet.

[Series 101: survey · axial · 10.0mm · 1.25mm/px · z∈[-33,+201]mm · 2 of 10 slices shown]
[im 1/10]
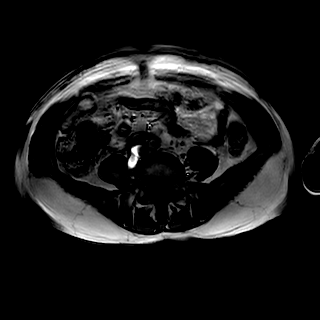
[im 10/10]
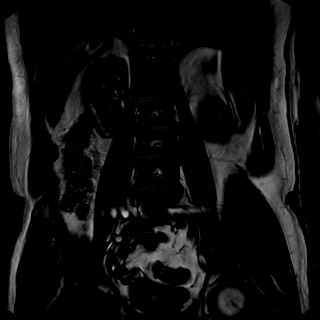

[Series 201: t2w_cor-surv · coronal · 6.0mm · 0.62mm/px · 1 of 10 slices shown]
[im 1/10]
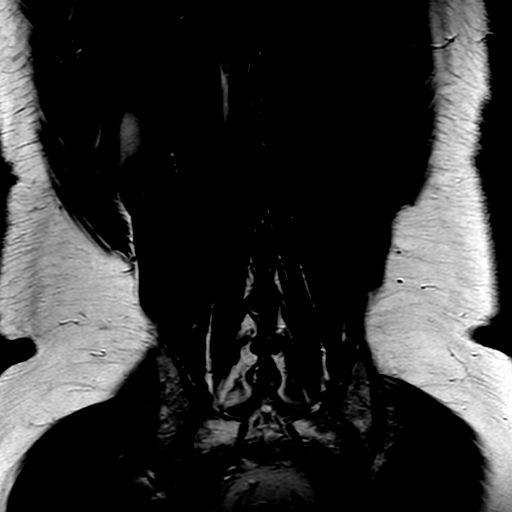

[Series 301: T1 · sagittal · 4.0mm · 0.41mm/px · 2 of 17 slices shown (1 of 2)]
[im 1/17]
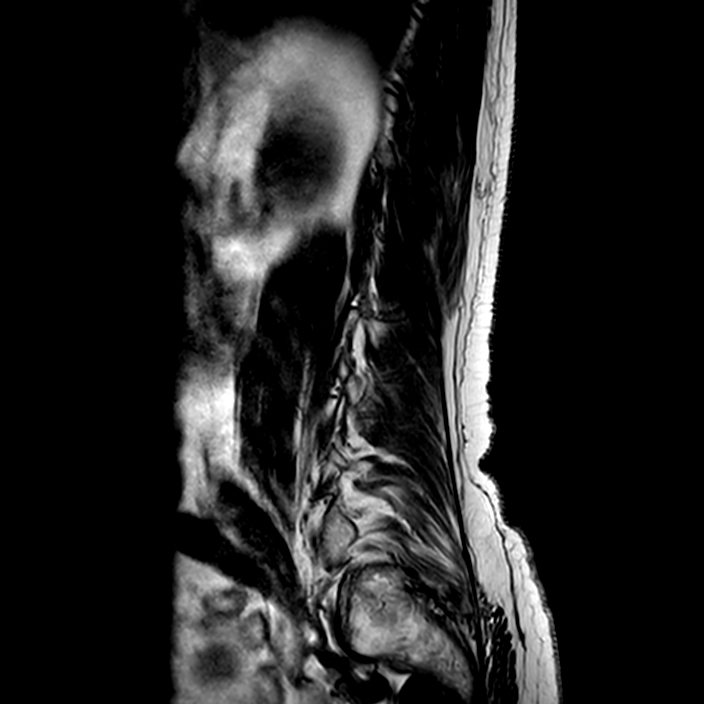
[im 17/17]
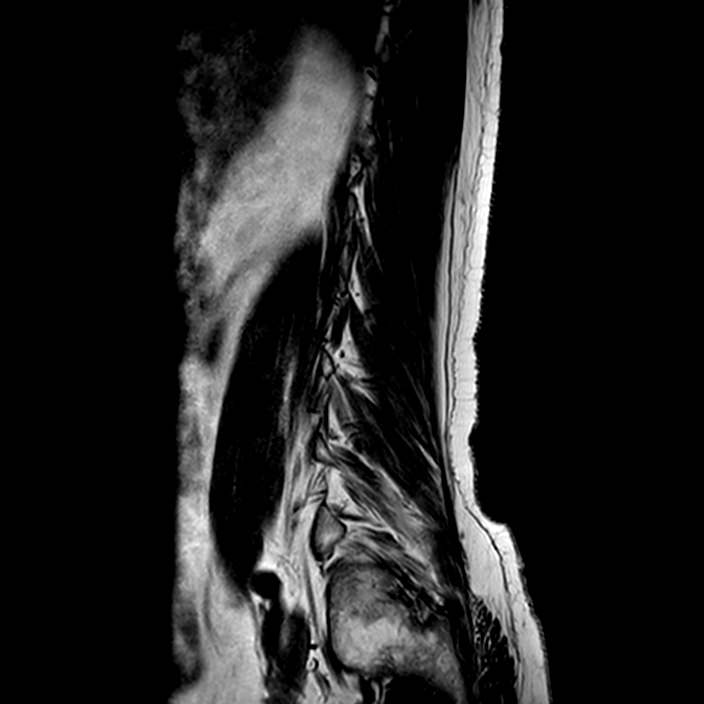

[Series 402: (id)_mdixon_tse · sagittal · 4.0mm · 0.38mm/px · 2 of 17 slices shown]
[im 1/17]
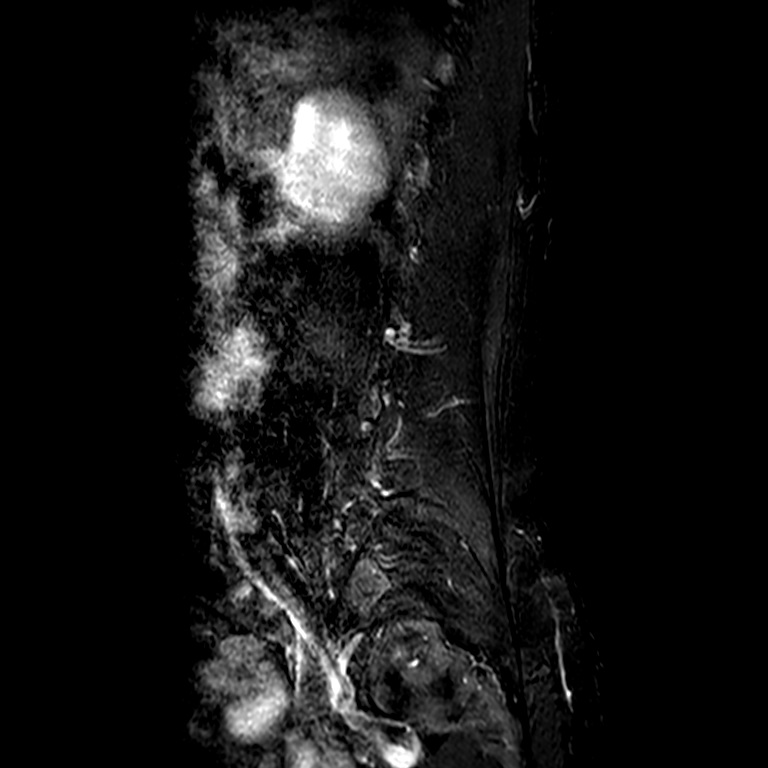
[im 17/17]
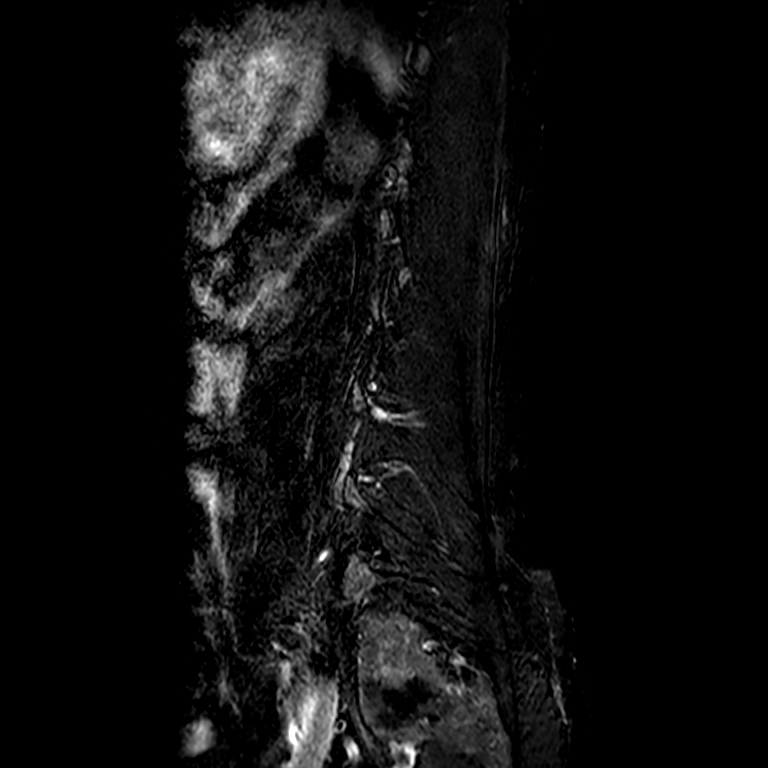

[Series 601: T2 · oblique · 4.0mm · 0.30mm/px · 4 of 30 slices shown]
[im 1/30]
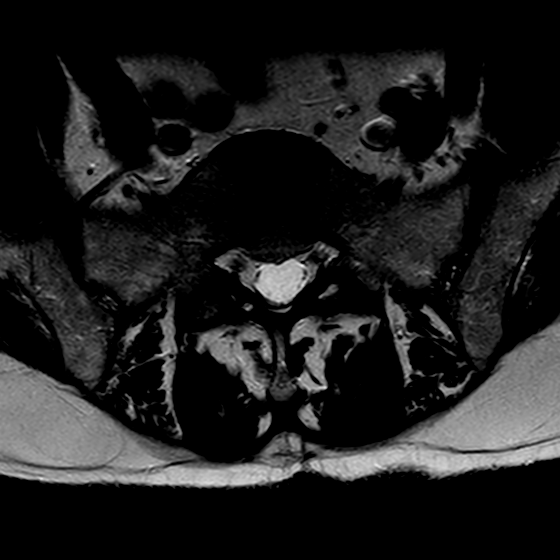
[im 10/30]
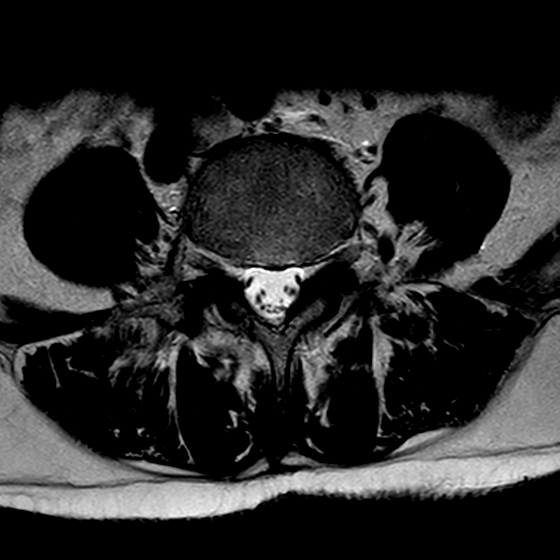
[im 20/30]
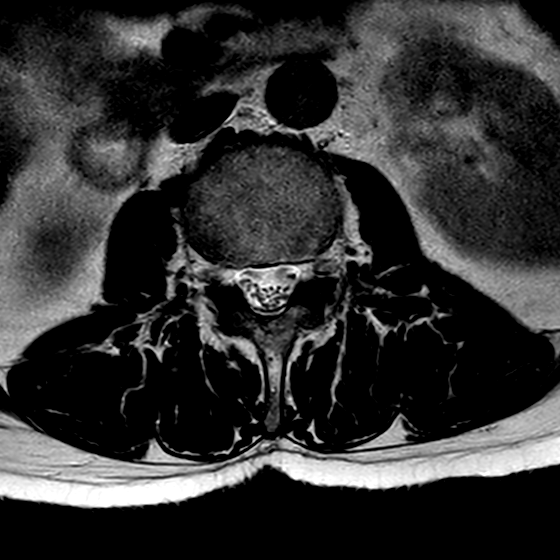
[im 30/30]
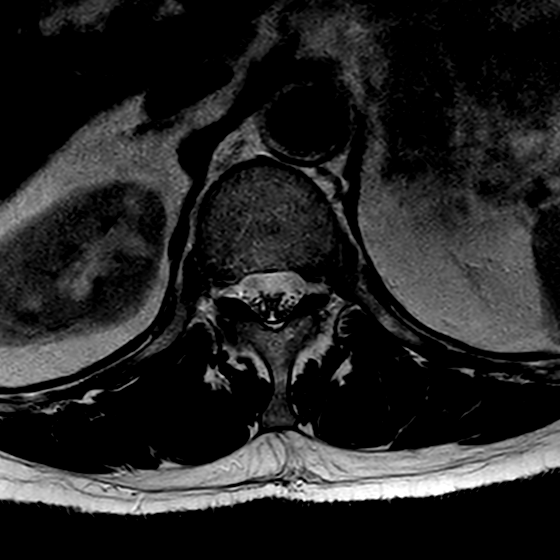

[Series 701: T1 · axial · 4.0mm · 0.39mm/px · z∈[-99,+85]mm · 5 of 42 slices shown (2 of 2)]
[im 1/42]
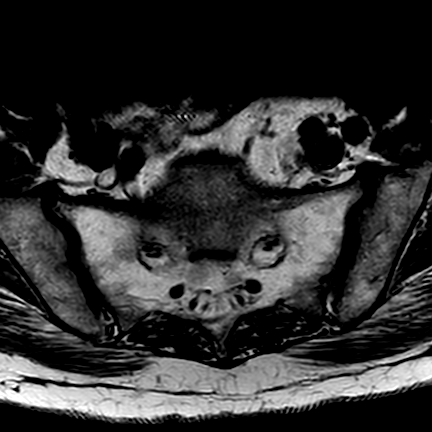
[im 11/42]
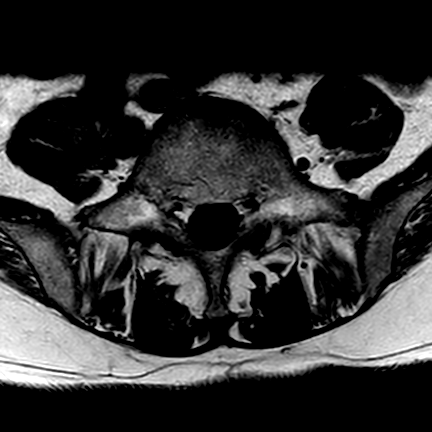
[im 21/42]
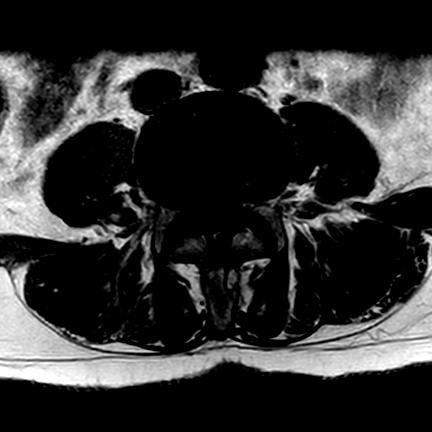
[im 31/42]
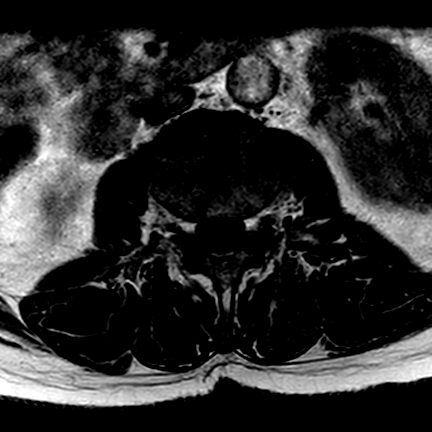
[im 42/42]
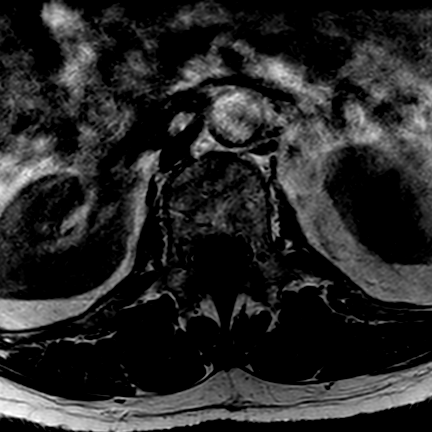

[16 of 48 positions shown; findings below may reference images not displayed]

FINDINGS: -------------------------------------------------------------------------------- 
------ 
GENERAL: 
Nomenclature is based on 5 lumbar type vertebral bodies.     
ALIGNMENT: Normal coronal alignment. Slight loss of the normal lumbar lordosis 
with trace grade 1 retrolisthesis L3 on L4. 
VERTEBRAL BODY HEIGHT: Normal.  
MARROW SIGNAL: No focal suspect signal abnormality. 
CORD SIGNAL: Normal distal spinal cord and cauda equina. Conus medullaris 
terminates at L1. 
ADDITIONAL FINDINGS: 20 mm cystic structure of the lower pole right kidney may 
reflect a cyst, but is poorly visualized.. 9 mm left renal cyst. 9 mm cyst 
involving the upper pole right kidney. 
Modic I-II: None. 
Ligamentum Flavum > 2.5 mm: All levels. 
-------------------------------------------------------------------------------- 
------ 
SEGMENTAL: 
T12-L1: Loss of disc signal. Canal and foramina are patent. Normal facets. 
L1-L2: Loss of disc signal. Minimal annular bulge. Canal and foramina are 
patent. Normal facets. 
L2-L3: Mild loss of disc height and signal. Minimal annular bulge. Mild canal 
stenosis with lateral recess narrowing bilaterally, worse on the right. Small 
bilateral facet joint effusions. Patent right foramen. Borderline left foraminal 
narrowing. 
L3-L4: Mild loss of disc height and signal. Right paracentral and foraminal disc 
protrusion is associated with annular fissure. It abuts and may slightly 
distorts the exiting right L3 nerve root. Correlate for right L3 radiculopathy. 
There is also moderate to moderately severe right foraminal narrowing. Otherwise 
there is mild diffuse annular bulge with borderline canal stenosis. Left foramen 
is mildly narrowed. Small bilateral facet joint effusions. 
L4-L5: Loss of disc signal. Minimal annular bulge. Canal patent. Mild right 
foraminal narrowing. Left foramen patent. Small bilateral facet joint effusions. 
L5-S1: Normal disc height and signal. Canal and foramina are patent. Small 
bilateral facet joint effusions. 
-------------------------------------------------------------------------------- 
------
IMPRESSION: 20 mm cystic structure lower pole right kidney may reflect a cyst, but is poorly 
characterized on this study. Future, follow-up dedicated renal ultrasound 
recommended. There are other, smaller bilateral renal cysts noted. 
L3-L4, right paracentral and foraminal disc protrusion is associated with 
annular fissure. It may be creating a right L3 radiculopathy. There is moderate 
to moderately severe right foraminal narrowing. 
Other, less significant degenerative changes detailed above.

## 2022-11-30 ENCOUNTER — Telehealth: Payer: Self-pay

## 2022-11-30 NOTE — Telephone Encounter (Signed)
Patient called to reschedule infusion on Wed 2[place] to [place] in early May. Patient rescheduled infusion for that time, patient now scheduled for 5/9.

## 2022-12-02 ENCOUNTER — Ambulatory Visit: Payer: Medicare (Managed Care)

## 2023-01-26 ENCOUNTER — Telehealth: Payer: Self-pay

## 2023-01-26 NOTE — Telephone Encounter (Signed)
err

## 2023-01-30 ENCOUNTER — Other Ambulatory Visit: Payer: Self-pay | Admitting: Family Medicine

## 2023-02-01 NOTE — Telephone Encounter (Signed)
Last office visit:   06/01/2022  Patients upcoming appointments:  Future Appointments   Date Time Provider Department Center   02/18/2023  1:00 PM [facility] None   02/22/2023  8:40 AM Rahmatullah, Gaye Pollack, MD [facility] None   02/24/2023  8:30 AM Gaetana Michaelis, MD [facility] None   03/30/2023  9:30 AM Mardee Postin, MD [facility] None     Recent Lab results:  GENERAL CHEMISTRY   Recent Labs     05/25/22  1316   NA 140   K CANCELED   CL 104   CO2 23   GAP 13   UN 18   CREAT 0.93   GLU 125*   CA 9.2      LIPID PROFILE   Recent Labs     02/11/22  0845   CHOL 130   TRIG 57   HDL 57   LDLC 62      LIVER PROFILE   Recent Labs     05/25/22  1316   ALT 37   AST CANCELED   ALK 83   TB 0.9      DIABETES THYROID   No value within the past 365 days No value within the past 365 days      Pending/Orders Labs:  Lab Frequency Next Occurrence   Lymphocyte subset (T & B cells) Once 02/18/2022   Renal function panel Once 03/23/2022

## 2023-02-17 ENCOUNTER — Telehealth: Payer: Self-pay

## 2023-02-17 ENCOUNTER — Ambulatory Visit: Payer: Medicare (Managed Care) | Admitting: Orthopedic Surgery

## 2023-02-17 ENCOUNTER — Other Ambulatory Visit: Payer: Self-pay
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
## 2023-02-18 ENCOUNTER — Ambulatory Visit: Payer: Medicare (Managed Care)

## 2023-02-18 VITALS — BP 120/72 | HR 61 | Temp 97.6°F | Resp 16

## 2023-02-18 DIAGNOSIS — G35 Multiple sclerosis: Secondary | ICD-10-CM | POA: Insufficient documentation

## 2023-02-18 LAB — CBC AND DIFFERENTIAL
Baso # K/uL: 0 10*3/uL (ref 0.0–0.2)
Eos # K/uL: 0.1 10*3/uL (ref 0.0–0.5)
Hematocrit: 41 % (ref 37–52)
Hemoglobin: 14.4 g/dL (ref 12.0–17.0)
IMM Granulocytes #: 0 10*3/uL (ref 0.0–0.0)
IMM Granulocytes: 0.3 %
Lymph # K/uL: 1.3 10*3/uL (ref 1.0–5.0)
MCV: 87 fL (ref 75–100)
Mono # K/uL: 0.6 10*3/uL (ref 0.1–1.0)
Neut # K/uL: 4.3 10*3/uL (ref 1.5–6.5)
Nucl RBC # K/uL: 0 10*3/uL (ref 0.0–0.0)
Nucl RBC %: 0 /100 WBC (ref 0.0–0.2)
Platelets: 193 10*3/uL (ref 150–450)
RBC: 4.7 MIL/uL (ref 4.0–6.0)
RDW: 12.5 % (ref 0.0–15.0)
Seg Neut %: 67.7 %
WBC: 6.4 10*3/uL (ref 3.5–11.0)

## 2023-02-18 LAB — COMPREHENSIVE METABOLIC PANEL
ALT: 35 U/L (ref 0–50)
AST: 38 U/L (ref 0–50)
Albumin: 4.3 g/dL (ref 3.5–5.2)
Alk Phos: 81 U/L (ref 40–130)
Anion Gap: 13 (ref 7–16)
Bilirubin,Total: 1 mg/dL (ref 0.0–1.2)
CO2: 25 mmol/L (ref 20–28)
Calcium: 9.3 mg/dL (ref 8.6–10.2)
Chloride: 102 mmol/L (ref 96–108)
Creatinine: 1.13 mg/dL (ref 0.67–1.17)
Glucose: 131 mg/dL — ABNORMAL HIGH (ref 60–99)
Lab: 17 mg/dL (ref 6–20)
Potassium: 4.3 mmol/L (ref 3.3–5.1)
Sodium: 140 mmol/L (ref 133–145)
Total Protein: 6.1 g/dL — ABNORMAL LOW (ref 6.3–7.7)
eGFR BY CREAT: 69 *

## 2023-02-18 LAB — IGG: IgG: 661 mg/dL — ABNORMAL LOW (ref 700–1600)

## 2023-02-18 LAB — IGM: IgM: 160 mg/dL (ref 40–230)

## 2023-02-18 LAB — IGA: IgA: 128 mg/dL (ref 70–400)

## 2023-02-18 LAB — PHOSPHORUS: Phosphorus: 3.7 mg/dL (ref 2.7–4.5)

## 2023-02-18 MED ORDER — METHYLPREDNISOLONE SOD SUCC 125 MG IJ SOLR(62.5MG/ML) *WRAPPED*
125.0000 mg | Freq: Once | INTRAMUSCULAR | Status: AC
Start: 2023-02-18 — End: 2023-02-18
  Administered 2023-02-18: 125 mg via INTRAVENOUS
  Filled 2023-02-18 (×2): qty 2

## 2023-02-18 MED ORDER — CETIRIZINE HCL 10 MG PO TABS *I*
10.0000 mg | ORAL_TABLET | Freq: Once | ORAL | Status: AC
Start: 2023-02-18 — End: 2023-02-18
  Administered 2023-02-18: 10 mg via ORAL
  Filled 2023-02-18 (×2): qty 1

## 2023-02-18 MED ORDER — DIPHENHYDRAMINE HCL 25 MG ORAL SOLID *WRAPPED*
25.0000 mg | Freq: Once | ORAL | Status: AC
Start: 2023-02-18 — End: 2023-02-18
  Administered 2023-02-18: 25 mg via ORAL
  Filled 2023-02-18 (×2): qty 100

## 2023-02-18 MED ORDER — SODIUM CHLORIDE 0.9 % IV SOLN WRAPPED *I*
600.0000 mg | Freq: Once | Status: AC
Start: 2023-02-18 — End: 2023-02-18
  Administered 2023-02-18: 600 mg via INTRAVENOUS
  Filled 2023-02-18 (×2): qty 20

## 2023-02-18 MED ORDER — ACETAMINOPHEN 325 MG PO TABS *I*
975.0000 mg | ORAL_TABLET | Freq: Once | ORAL | Status: AC
Start: 2023-02-18 — End: 2023-02-18
  Administered 2023-02-18: 975 mg via ORAL
  Filled 2023-02-18 (×2): qty 3

## 2023-02-18 MED ORDER — SODIUM CHLORIDE 0.9 % IV SOLN WRAPPED *I*
50.0000 mL/h | Status: DC | PRN
Start: 2023-02-18 — End: 2023-02-18
  Administered 2023-02-18: 50 mL/h via INTRAVENOUS

## 2023-02-18 NOTE — Patient Instructions (Signed)
Please reach out to the clinic with any additional questions or concerns. We encourage you to sign up and use MyChart for any non-urgent medical questions as MyChart is the preferred method of communication.  Please contact your pharmacy for medication refills and allow up to 2 business days for routine prescription refills.  For urgent messages, please call the clinic at [phone].  The clinic is open from 8am to 4:30pm Monday through Friday.    You can get [facility] appointment reminders by text - please make sure we have your cell phone number on file at check in/check-out.  Then you can just text [facility] to [phone] to receive appointment messages.         Continue with your usual medications as prescribed.    Ocrevus can increase your risk of respiratory, skin, and herpes infections, please call [phone] with any of the following:    Fevers/chills  Cough that will not go away  Signs and symptoms of respiratory infections  Cold sores/shingles/genital sores  Itching/rashes/hives     Side effects can occur up to a week after infusion.  If you experience body aches or low grade fever you can take Tylenol or Ibuprofen as prescribed. If you experience a rash or itching you can take Benadryl or any other allergy medicine you may have as prescribed. Please call the patient line at [phone] if symptoms don't go away.    You may have more chance of getting an infection. Wash hands often. Stay away from people with infections, colds, or flu. Continue to wear your mask out in public. It is highly recommended that you get a COVID-19 vaccine and booster. People who are fully vaccinated on this therapy have gotten COVID-19. If you think you may have COVID-19 get tested right away. If you are positive you may qualify for medications to lessen the severity of COVID-19. There is a limited time frame these medications can be administered so please get tested as soon as possible and let your neurologist know of a  positive test result.    You should not receive any live vaccines while on Ocrevus.  Please check with your MD first. Be sure to have regular breast exams. Your doctor will tell you know how often to have these. You will also need to do breast self-exams as your doctor has told you.  Women should avoid pregnancy while taking this medication. Let your Doctor know right away if you become pregnant.    Schedule a follow-up with your provider as close to your infusion appointment as possible but within 4 -6 months after your infusion.  Contact Neurology Scheduling for assistance at [phone] and listen for the prompts.    If you have an insurance changes, please notify the office at [phone] as benefits must be verified.

## 2023-02-18 NOTE — Progress Notes (Signed)
Pt arrives for Ocrevus infusion # 7 . Denies recent illness, infection or fever.  Pre-medicated as per MAR.   Ocrevus infused per rapid infusion protocol. Pt tolerated infusion well, and was discharged home stable.

## 2023-02-22 ENCOUNTER — Encounter: Payer: Self-pay | Admitting: Family Medicine

## 2023-02-22 ENCOUNTER — Other Ambulatory Visit: Payer: Self-pay

## 2023-02-22 ENCOUNTER — Ambulatory Visit: Payer: Medicare (Managed Care) | Admitting: Family Medicine

## 2023-02-22 VITALS — BP 124/77 | HR 67 | Temp 97.5°F | Ht 71.0 in | Wt 173.0 lb

## 2023-02-22 DIAGNOSIS — Z0000 Encounter for general adult medical examination without abnormal findings: Secondary | ICD-10-CM

## 2023-02-22 NOTE — Progress Notes (Signed)
Visit performed as:           Office Visit, met with patient in person    Today we reviewed and updated [name]'s smoking status, activities of daily living, depression screen, fall risk, medications and allergies.   I have counseled the patient in the above areas.     Subjective:     Chief Complaint: [name] is a 72 y.o. male here for a/an Annual Exam and Subsequent Annual Medicare Visit        In general, [name] rates their overall health as:  good      Patient Care Team:  Galen Daft, MD as PCP - General (Primary Care)  Gaetana Michaelis, MD as Provider Team (Neurology)  Delton See, Urology  Lennette Bihari, Jillene Bucks, MD as Provider Team (Cardiology)  Eli Hose, MD as Provider Team (Ophthalmology)  Courtney Paris, MD as Provider Team (Dermatology)       Current Outpatient Medications on File Prior to Visit   Medication Sig Dispense Refill    Calcium Citrate-Vitamin D (CALCIUM CITRATE +D PO) Take 1 capsule by mouth daily.      tadalafil (CIALIS) 5 MG tablet TAKE 1 TABLET (5 MG TOTAL) BY MOUTH DAILY. 90 tablet 0    atorvastatin (LIPITOR) 40 mg tablet Take 1 tablet (40 mg total) by mouth nightly 90 tablet 3    dalfampridine (AMPYRA) 10 MG tablet Take 1 tablet (10 mg total) by mouth 2 times daily 60 tablet 5    ocrelizumab (OCREVUS) 300 MG/10ML injection Administer into the vein every 6 months      ascorbic acid (VITAMIN C) 100 MG tablet Take 1 tablet (100 mg total) by mouth daily.      Cyanocobalamin (VITAMIN B-12 CR PO) Take 1,000 mcg by mouth daily          No current facility-administered medications on file prior to visit.     No Known Allergies (drug, envir, food or latex)  Patient Active Problem List    Diagnosis Date Noted    Multiple sclerosis-Re-enrolled MSPATHS 001, 01/15/20 05/30/2008     Priority: High     Relapsing; remitting, doing well on oral Tecfidera.  Taking coenzyme Q10 for muscle irritability, seems better.        Mild reactive airways disease 11/28/2012     Priority:  Medium     See 2021 allergy testing, all negative!      Thoracic aortic aneurysm (TAA) 02/22/2009     Priority: Medium     mild- 4.0 cm  by CT scan 5/10  See 6/13 echo at [facility] describing no change, plan for 24 month f/u  See [id number] [facility] note.  See 04/06/16 echo ([facility]) mild.        Hyperlipidemia 05/30/2008     Priority: Medium     Normal TG, just normal HDL, and LDL as high as 150's at times.  No premature CVD in family, mother with CAD late.  Started statin in 2014, supplementing with coenzyme Q10 to minimize muscle side effects.      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      Enlarged prostate with lower urinary tract symptoms (LUTS) 01/26/2017    Environmental allergies 05.25.18    Extrinsic allergic asthma 11/28/2012    Drenda Freeze follows, usually on max therapy in anticipation of bad ragweed season.     HLD (hyperlipidemia)     Hyperlipidemia 05/30/2008    Normal TG, just normal HDL, and LDL as high as 150's at times. No premature CVD in family, mother with CAD late. Started statin in 2014, supplementing with coenzyme Q10 to minimize muscle side effects.     Incomplete Right Bundle Branch Block 05/30/2008          Multiple sclerosis     Osteoarthritis of right knee 02/01/2014    Psoriasis     S/P R TKA 07/31/2019 07/31/2019    Thoracic aortic aneurysm (TAA) 02/22/2009    mild- 4.0 cm  by CT scan 5/10 See 6/13 echo [facility] describing no change, plan for 24 month f/u See [id number] [facility] note. See 04/06/16 echo ([facility]) mild.     Tic  disorder      Past Surgical History:   Procedure Laterality Date    COLONOSCOPY      HERNIA REPAIR      HX TONSILLECTOMY/ADENOIDECTOMY      INCISIONAL HERNIA REPAIR      JOINT REPLACEMENT  07/30/2028    Right knee replacement    KNEE ARTHROSCOPY Right     OTHER SURGICAL HISTORY      MS    PR ARTHRP KNE CONDYLE&PLATU MEDIAL&LAT COMPARTMENTS Right 07/31/2019    Procedure: RIGHT TOTAL KNEE ARTHROPLASTY;  Surgeon: Laurance Flatten, MD;  Location: [facility];  Service: Orthopedics    VASECTOMY       Family History   Problem Relation Age of Onset    Heart Disease Mother         coronary art disease, not premature    Cancer Father         multiple myeloma    Cancer Maternal Grandmother         unknown    Heart Disease Brother         aneurism-07-09-18    Multiple Sclerosis Neg Hx     Lupus Neg Hx     Rheum arthritis Neg Hx     Thyroid disease Neg Hx     Diabetes Neg Hx     Colon cancer Neg Hx     Colon polyps Neg Hx     High Blood Pressure Neg Hx      Social History     Socioeconomic History    Marital status: Married   Tobacco Use    Smoking status: Never    Smokeless tobacco: Never   Substance and Sexual Activity    Alcohol use: Not Currently     Comment: Very occasionally    Drug use: Yes     Frequency: 3.0 times per week     Types: Marijuana    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        Exercise -- doubles tennis or pickle ball up to 5 times weekly, joined [employer] and plans to add some resistance machines.        Diet --  varied, more lean proteins, complex carbs, not a lot of dessert, some greens.  Protein shakes at bkfst and lunch, flax, seeds, yogurt.        Sleep -- active dreams, variable sleep success.  No GU interruption.        Safety -- seatbelt and no distractions, smoke detectors, sunglasses, plans to be more consistent with skin block.       Objective:     Vital Signs: BP 124/77   Pulse 67   Temp 36.4 C (97.5 F) (Temporal)   Ht  1.803 m (5\' 11" )   Wt 78.5 kg (173 lb)   SpO2 97%   BMI 24.13 kg/m    BMI: Body mass index is 24.13 kg/m.    Vision Screening Results (Welcome visit only):  No results found.    Depression Screening Results:  Review Flowsheet  More data exists         02/22/2023 05/04/2022 02/24/2021 02/06/2020 07/03/2019 05/17/2019 11/25/2018   PHQ Scores   PSQ2 Q1 - Interest/Pleasure - - - N N N N   PSQ2 Q2 - Down, Depressed, Hopeless - - - N N N N   PHQ Calculated Score 0 0 0 - - - -     Promis Cat V1.0 - Depression    02/16/2023  3:34 PM EDT - Filed by Patient   I felt depressed Never   I felt unhappy Never   I felt sad Rarely   I felt disappointed in myself Never   I felt discouraged about the future Never   I felt emotionally exhausted Never   I felt pessimistic Never   I felt lonely Never   PROMIS Depression T-Score (range: 10 - 90) 40 (within normal limits)        Opioid Use/DAST- 10 Screening Results:   How many times in the past year have you used an illegal drug or used a prescription medication for nonmedical reasons?: 0 (02/22/2023  8:38 AM)  DAST-10 Score: 1 (02/16/2023  3:36 PM)    Activities of Daily Living/Functional Screening Results:  Is the person deaf or does he/she have serious difficulty hearing?: N (02/22/2023  8:37 AM)  Is this person blind or does he/she have serious difficulty seeing even when wearing glasses?: N (02/22/2023  8:37 AM)  *Vision Status: Visual aid  (02/18/2023 12:58 PM)  Does this person have serious difficulty walking or climbing stairs?: Y (02/22/2023  8:37 AM)  *Walks in Home: Independent (02/22/2023  8:37 AM)  *Climbing Stairs: Independent (02/22/2023  8:37 AM)  Does this person have difficulty dressing or bathing?: N (02/22/2023  8:37 AM)  *Shopping: Independent (02/22/2023  8:37 AM)  *House Keeping: Independent (02/22/2023  8:37 AM)  *Managing Own Medications: Independent (02/22/2023  8:37 AM)  *Handling Finances: Independent (02/22/2023  8:37 AM)  Difficulty doing errands due to a physicial, mental or  emotional condition: No (02/22/2023  8:37 AM)  Difficulty remembering or making decisions due to a physicial, mental or emotional condition: No (02/22/2023  8:37 AM)      Fall Risk Screening Results:  Have you fallen in the last year?: No (02/22/2023  8:37 AM)  Did you sustain an injury which required medical attention?: No (02/18/2023 12:57 PM)  Do you feel you are at risk for falling?: No (02/22/2023  8:37 AM)  Do you feel your risk of falling is: : Low (02/18/2023 12:57 PM)  Would you like to learn more about how to reduce your risk of falling?: No (  02/18/2023 12:57 PM)      Assessment and Plan:     Cognitive Function:  Recall of recent and remote events appears:  Normal      Advanced Care Planning:  was discussed and the paperwork can be found in the scanned media section     The following health maintenance plan was reviewed with the patient:    Health Maintenance Topics with due status: Postponed       Topic Postponed Until    HIV Screening [place] 01/24/2039 (Originally 02/27/1964)     Health Maintenance Topics with due status: Not Due       Topic Last Completion Date    Colon Cancer Screening USPSTF 04/30/2015    IMM-Influenza 08/11/2021    IMM DTaP/Tdap/Td 12/19/2021    Depression Screen Yearly 02/22/2023    Fall Risk Screening 02/22/2023     Health Maintenance Topics with due status: Completed       Topic Last Completion Date    IMM Pneumo: 65+ Years 11/05/2016    IMM-Zoster 05/07/2017    Hepatitis C Screening USPSTF/[place] 03/30/2020    COVID-19 Vaccine 07/06/2022     Health Maintenance Topics with due status: Aged [employer] Date Due    IMM-Hepatitis B Vaccine Aged Out    IMM-HIB 0-5 Yrs or At-Risk Patients Aged Out    IMM-HPV 9-26 Yrs or Shared Decision (27-45 Yrs) Aged Out    IMM-MCV4 0-18 Yrs or At-Risk Patients Aged Out    IMM-Rotavirus 0-8 Months Aged Out     This health maintenance schedule, identified risks, a list of orders placed today and patient goals have been provided to [employer] in the after  visit summary.     Plan for any concerns identified during screening or risk assessments:  n/a

## 2023-02-22 NOTE — Patient Instructions (Signed)
Thank you for completing your Annual Exam and Subsequent Annual Medicare Visit   with [place] today.     The purpose of this visits was to:    Screen for disease  Assess risk of future medical problems  Help develop a healthy lifestyle  Update vaccines  Get to know your doctor in case of an illness    Patient Care Team:  Galen Daft, MD as PCP - General (Primary Care)  Gaetana Michaelis, MD as Provider Team (Neurology)  Lennette Bihari Jillene Bucks, MD as Provider Team (Cardiology)  Eli Hose, MD as Provider Team (Ophthalmology)  Courtney Paris, MD as Provider Team (Dermatology)  Sophronia Simas, MD as Provider Team (Allergy/Immunology/Rheumatology)     Medicare 5 Year Plan    The following items were identified as areas of concern during your screening today:  None identified - This is great news. You have avoided Smoking, Diabetes, High Blood Pressure (Hypertension) and a high BMI (you are not overweight).       The Health Maintenance table below identifies screening tests and immunizations recommended by your health care team:  Health Maintenance: These screening recommendations are based on USPSTF, [employer], and [place] state guidelines   Topic Date Due   . HIV Screening  01/24/2039 (Originally 02/27/1964)   . Flu Shot (Season Ended) 06/13/2023   . Depression Screen Yearly  02/22/2024   . Fall Risk Screening  02/22/2024   . Colon Cancer Screening  04/29/2025   . DTaP/Tdap/Td Vaccines (4 - Td or Tdap) 12/20/2031   . Shingles Vaccine  Completed   . Hepatitis C Screening  Completed   . Pneumococcal Vaccination  Completed   . COVID-19 Vaccine  Completed   . Hepatitis B Vaccine  Aged Out   . HIB Vaccine  Aged Out   . HPV Vaccine  Aged Out   . Meningococcal Vaccine  Aged Out   . Rotavirus Vaccine  Aged Out     In addition, goals and orders placed to address these recommendations are listed in the "Today's Visit" section.    We wish you the best of health and look forward to seeing you again next year  for your Annual Medicare Wellness Visit.     If you have any health care concerns before then, please do not hesitate to contact us.

## 2023-02-24 ENCOUNTER — Other Ambulatory Visit: Payer: Self-pay

## 2023-02-24 ENCOUNTER — Ambulatory Visit: Payer: Medicare (Managed Care) | Attending: Neurology | Admitting: Neurology

## 2023-02-24 ENCOUNTER — Encounter: Payer: Self-pay | Admitting: Neurology

## 2023-02-24 VITALS — BP 120/70 | HR 66 | Temp 98.4°F | Wt 173.0 lb

## 2023-02-24 DIAGNOSIS — G35 Multiple sclerosis: Secondary | ICD-10-CM | POA: Insufficient documentation

## 2023-02-24 NOTE — Patient Instructions (Signed)
You can consider trying Salon Pas with lidocaine (over-the-counter) to see if this takes away some of the feet uncomfortable numb sensation as needed.      Please reach out to the clinic with any additional questions or concerns. We encourage you to sign up and use MyChart for any non-urgent medical questions as MyChart is the preferred method of communication.  Please contact your pharmacy for medication refills and allow up to 2 business days for routine prescription refills.  For urgent messages, please call the clinic at [phone].  The clinic is open from 8am to 4:30pm Monday through Friday.    You can get [facility] appointment reminders by text - please make sure we have your cell phone number on file at check in/check-out.  Then you can just text [facility] to [phone] to receive appointment messages.

## 2023-03-01 ENCOUNTER — Other Ambulatory Visit
Admission: RE | Admit: 2023-03-01 | Discharge: 2023-03-01 | Disposition: A | Discharge status: Home / Self Care | Payer: Medicare (Managed Care) | Source: Ambulatory Visit | Attending: Family Medicine | Admitting: Family Medicine

## 2023-03-01 DIAGNOSIS — Z0000 Encounter for general adult medical examination without abnormal findings: Secondary | ICD-10-CM | POA: Insufficient documentation

## 2023-03-01 LAB — LIPID PANEL
Chol/HDL Ratio: 1.8
Cholesterol: 107 mg/dL
HDL: 58 mg/dL (ref 40–60)
LDL Calculated: 38 mg/dL
Non HDL Cholesterol: 49 mg/dL
Triglycerides: 54 mg/dL

## 2023-03-01 LAB — VITAMIN D: 25-OH Vit Total: 35 ng/mL (ref 30–60)

## 2023-03-01 LAB — HEMOGLOBIN A1C: Hemoglobin A1C: 5.1 %

## 2023-03-02 ENCOUNTER — Encounter: Payer: Self-pay | Admitting: Family Medicine

## 2023-03-02 DIAGNOSIS — K649 Unspecified hemorrhoids: Secondary | ICD-10-CM

## 2023-03-22 ENCOUNTER — Ambulatory Visit: Payer: Medicare (Managed Care) | Attending: Surgery | Admitting: Surgery

## 2023-03-22 ENCOUNTER — Other Ambulatory Visit: Payer: Self-pay

## 2023-03-22 ENCOUNTER — Encounter: Payer: Self-pay | Admitting: Surgery

## 2023-03-22 VITALS — BP 147/96 | HR 75 | Temp 97.5°F | Resp 18 | Ht 71.0 in | Wt 175.0 lb

## 2023-03-22 DIAGNOSIS — K649 Unspecified hemorrhoids: Secondary | ICD-10-CM

## 2023-03-22 NOTE — Preop H&P (Deleted)
History of Present Illness:  [name] is a 72 year old patient with PMHx of LVH, normal F, who is here as a new patient for hemorrhoids.     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      Enlarged prostate with lower urinary tract symptoms (LUTS) 01/26/2017    Environmental allergies 05.25.18    Extrinsic allergic asthma 11/28/2012    Drenda Freeze follows, usually on max therapy in anticipation of bad ragweed season.     HLD (hyperlipidemia)     Hyperlipidemia 05/30/2008    Normal TG, just normal HDL, and LDL as high as 150's at times. No premature CVD in family, mother with CAD late. Started statin in 2014, supplementing with coenzyme Q10 to minimize muscle side effects.     Incomplete Right Bundle Branch Block 05/30/2008          Multiple sclerosis     Osteoarthritis of right knee 02/01/2014    Psoriasis     S/P R TKA 07/31/2019 07/31/2019    Thoracic aortic aneurysm (TAA) 02/22/2009    mild- 4.0 cm  by CT scan 5/10 See 6/13 echo at [facility] describing no change, plan for 24 month f/u See [id number] [facility] note. See 04/06/16 echo ([facility]) mild.     Tic disorder      Past Surgical History:   Procedure Laterality Date    COLONOSCOPY      HERNIA REPAIR      HX TONSILLECTOMY/ADENOIDECTOMY      INCISIONAL HERNIA REPAIR      JOINT REPLACEMENT  07/30/2028    Right knee replacement    KNEE ARTHROSCOPY Right     OTHER SURGICAL HISTORY      MS    PR ARTHRP KNE CONDYLE&PLATU MEDIAL&LAT COMPARTMENTS Right 07/31/2019    Procedure: RIGHT TOTAL KNEE ARTHROPLASTY;  Surgeon: Laurance Flatten, MD;  Location: [facility] MAIN OR;  Service: Orthopedics    VASECTOMY       Family History   Problem Relation Age of Onset    Heart Disease Mother         coronary art disease, not premature    Cancer Father         multiple myeloma     Cancer Maternal Grandmother         unknown    Heart Disease Brother         aneurism-07-09-18    Multiple Sclerosis Neg Hx     Lupus Neg Hx     Rheum arthritis Neg Hx     Thyroid disease Neg Hx     Diabetes Neg Hx     Colon cancer Neg Hx     Colon polyps Neg Hx     High Blood Pressure Neg Hx      Social History     Socioeconomic History    Marital status: Married   Tobacco Use    Smoking status: Never    Smokeless tobacco: Never   Substance and Sexual Activity    Alcohol use: Not Currently     Comment: Very occasionally    Drug use: Yes     Frequency: 3.0 times per week     Types: Marijuana    Sexual activity: Never  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        Exercise -- doubles tennis or pickle ball up to 5 times weekly, joined [employer] and plans to add some resistance machines.        Diet -- varied, more lean proteins, complex carbs, not a lot of dessert, some greens.  Protein shakes at bkfst and lunch, flax, seeds, yogurt.        Sleep -- active dreams, variable sleep success.  No GU interruption.        Safety -- seatbelt and no distractions, smoke detectors, sunglasses, plans to be more consistent with skin block.       Allergies:   No Known Allergies (drug, envir, food or latex)    Medications:  Current Outpatient Medications   Medication    Calcium Citrate-Vitamin D (CALCIUM CITRATE +D PO)    tadalafil (CIALIS) 5 MG tablet    atorvastatin (LIPITOR) 40 mg tablet    dalfampridine (AMPYRA) 10 MG tablet    ocrelizumab (OCREVUS) 300 MG/10ML injection    ascorbic acid (VITAMIN C) 100 MG tablet    Cyanocobalamin (VITAMIN B-12 CR PO)     No current facility-administered medications for this visit.        Review of Systems:   ROS    There were no vitals taken for this visit.    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 Details include:

## 2023-03-22 NOTE — H&P (Signed)
HISTORY:  No chief complaint on file.    History of Present Illness:    HPI: [name] is a 72 yo male with Multiple Sclerosis, RBBB, LUTS and ascending aorta dilation seen today for further evaluation of hemorrhoids.  He presents today c/o rectal bleeding. This began in February while in [place]. He has since had intermittent rectal bleeding that can stop for several days at a time. Not painful. He defecates 1-2 per day, sitting for 10-15 minutes at a time and bowel movements are soft. Denies straining or prolapse, no mucous or fecal leakage. He takes metamucil BID.   His last colonoscopy was 04/30/2015 that revealed moderate diverticulosis and internal hemorrhoids with recommendation forr repeat in 10 years.    Problems:  Patient Active Problem List   Diagnosis Code   . Hyperlipidemia E78.5   . Incomplete Right Bundle Branch Block I45.10   . Multiple sclerosis-Re-enrolled MSPATHS 001, 01/15/20 G35   . Thoracic aortic aneurysm (TAA) I71.20   . 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 Erectile dysfunction, unspecified erectile dysfunction type N52.9   . Enlarged prostate with lower urinary tract symptoms (LUTS) N40.1   . Tic disorder F95.9   . 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 Asthma    . 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     . Enlarged prostate with lower urinary tract symptoms (LUTS) 01/26/2017   . Environmental allergies 05.25.18   . Extrinsic allergic asthma 11/28/2012    Drenda Freeze follows, usually on max therapy in anticipation of bad ragweed season.    Marland Kitchen HLD (hyperlipidemia)    . Hyperlipidemia 05/30/2008    Normal TG, just normal HDL, and LDL as high as 150's at times. No premature CVD in family, mother with CAD late. Started statin in 2014,  supplementing with coenzyme Q10 to minimize muscle side effects.    . Incomplete Right Bundle Branch Block 05/30/2008         . Multiple sclerosis    . Osteoarthritis of right knee 02/01/2014   . Psoriasis    . S/P R TKA 07/31/2019 07/31/2019   . Thoracic aortic aneurysm (TAA) 02/22/2009    mild- 4.0 cm  by CT scan 5/10 See 6/13 echo at [facility] describing no change, plan for 24 month f/u See [id number] [facility] note. See 04/06/16 echo ([facility]) mild.    . Tic disorder      Past Surgical History:   Procedure Laterality Date   . COLONOSCOPY     . HERNIA REPAIR     . HX TONSILLECTOMY/ADENOIDECTOMY     . INCISIONAL HERNIA REPAIR     . JOINT REPLACEMENT  07/30/2028    Right knee replacement   . KNEE ARTHROSCOPY Right    . OTHER SURGICAL HISTORY      MS   . PR ARTHRP KNE CONDYLE&PLATU MEDIAL&LAT COMPARTMENTS Right 07/31/2019    Procedure: RIGHT TOTAL KNEE ARTHROPLASTY;  Surgeon: Laurance Flatten, MD;  Location: [facility];  Service: Orthopedics   . VASECTOMY           Allergies:  No Known Allergies (drug, envir, food or latex)    Current medications:    Current Outpatient Medications  Medication Sig   . Calcium Citrate-Vitamin D (CALCIUM CITRATE +D PO) Take 1 capsule by mouth daily.   . tadalafil (CIALIS) 5 MG tablet TAKE 1 TABLET (5 MG TOTAL) BY MOUTH DAILY.   Marland Kitchen atorvastatin (LIPITOR) 40 mg tablet Take 1 tablet (40 mg total) by mouth nightly   . dalfampridine (AMPYRA) 10 MG tablet Take 1 tablet (10 mg total) by mouth 2 times daily   . ocrelizumab (OCREVUS) 300 MG/10ML injection Administer into the vein every 6 months   . ascorbic acid (VITAMIN C) 100 MG tablet Take 1 tablet (100 mg total) by mouth daily.   . Cyanocobalamin (VITAMIN B-12 CR PO) Take 1,000 mcg by mouth daily          Family History:    Family History   Problem Relation Age of Onset   . Heart Disease Mother         coronary art disease, not premature   . Cancer Father         multiple myeloma   . Heart Disease Brother         aneurism-07-09-18   . Cancer Maternal  Grandmother         unknown   . Multiple Sclerosis Neg Hx    . Lupus Neg Hx    . Rheum arthritis Neg Hx    . Thyroid disease Neg Hx    . Diabetes Neg Hx    . Colon cancer Neg Hx    . Colon polyps Neg Hx    . High Blood Pressure Neg Hx    . Rectal cancer Neg Hx        Social/Occupational History:   Social History     Socioeconomic History   . Marital status: Married   Tobacco Use   . Smoking status: Never   . Smokeless tobacco: Never   Substance and Sexual Activity   . Alcohol use: Not Currently     Comment: Very occasionally   . Drug use: Yes     Types: Marijuana     Comment: occ   . 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        Exercise -- doubles tennis or pickle ball up to 5 times weekly, joined [employer] and plans to add some resistance machines.        Diet -- varied, more lean proteins, complex carbs, not a lot of dessert, some greens.  Protein shakes at bkfst and lunch, flax, seeds, yogurt.        Sleep -- active dreams, variable sleep success.  No GU interruption.        Safety -- seatbelt and no distractions, smoke detectors, sunglasses, plans to be more consistent with skin block.         Review of Systems:    Review of Systems   HENT: Negative.     Eyes: Negative.    Respiratory: Negative.     Cardiovascular: Negative.    Gastrointestinal:  Negative for abdominal pain, constipation and diarrhea.        +rectal bleeding   Neurological:         +balance issues (M.S. related)     Vital Signs:   BP (!) 147/96 (BP Location: Right arm, Patient Position: Sitting, Cuff Size: adult)   Pulse 75   Temp 36.4 C (97.5 F)   Resp 18   Ht 1.803 m (5\' 11" )  Wt 79.4 kg (175 lb)   SpO2 96%   BMI 24.41 kg/m       PHYSICAL EXAM:  Physical Exam  Constitutional:       Appearance: Normal appearance. He is normal weight.   HENT:      Head: Normocephalic.   Cardiovascular:      Rate and Rhythm: Normal rate and regular rhythm.      Heart sounds: Murmur  (known mitral regurgitation) heard.   Pulmonary:      Effort: Pulmonary effort is normal. No respiratory distress.      Breath sounds: Normal breath sounds.   Genitourinary:     Comments: Anorectal exam   External appearance: non swollen external hemorrhoids in the left lateral and right lateral positions, otherwise normal perianal skin, internal hemorrhoid in the right posterior position that is irritated and can be seen with parting of the anal verge  DRE: normal tone, no masses    Procedure  Anoscopy: right posterior and right anterior internal hemorrhoids that are non swollen, right posterior one seems most significant    Musculoskeletal:         General: Normal range of motion.      Right lower leg: No edema.      Left lower leg: No edema.   Skin:     General: Skin is warm and dry.   Neurological:      Mental Status: He is alert and oriented to person, place, and time.   Psychiatric:         Mood and Affect: Mood normal.         Behavior: Behavior normal.         Thought Content: Thought content normal.         Judgment: Judgment normal.         COLONOSCOPY: 04/30/2015  Impression:   Diverticulosis, moderate   Internal hemorrhoids     Recommendations:   Recommend stool FIT in 3 years, if negative repeat colonoscopy in   10 years     Assessment and Plan:       Attending Addendum:  I saw and evaluated the patient, and I provided the substantive portion the medical decision making. I made  the management plan. I take responsibility for the plan and the patient's management. The medical decision making was personally performed by me and any additions to our assessment and care plan are noted below:  72 y/o M with symptomatic internal hemorrhoids with symptoms of intermittent bleeding. We discussed the etiology, anatomy and management of hemorrhoids. He is already on a fiber supplement and taking his appropriately. We discussed procedural interventions of banding and the risks of bleeding, infection, urinary  retention, and recurrence (20% at 2-3 years for banding) as well as the anticipated recovery. I also recommended that he repeat his colonoscopy sooner to make sure there was no other etiology of his bleeding. We can arrange that for him with a banding appointment after. He will call in the interim if any questions or concerns.              Matilde Sprang Jaimie Redditt, MD 4:18 PM 03/22/2023

## 2023-03-23 ENCOUNTER — Encounter: Payer: Self-pay | Admitting: Neurology

## 2023-03-23 ENCOUNTER — Telehealth: Payer: Self-pay | Admitting: Surgery

## 2023-03-23 NOTE — Telephone Encounter (Signed)
See patient's MyChart message. Will defer to Kathy Peets.

## 2023-03-23 NOTE — Telephone Encounter (Signed)
LVM for pt to schedule urgent colonoscopy and banding with Dr Trixie Dredge    If pt calls back, can offer these dates for colonoscopy:  7/5 am  7/8 am  8/2 am    Pt will also need to be scheduled for banding for after the scope.  Possible times to offer (please check if still available before booking)  7/17 at 9:30 am  7/22 at 1:15 pm  7/31 at 2 pm  8/5 at 1:15 or at 2 pm

## 2023-03-30 ENCOUNTER — Ambulatory Visit: Payer: Medicare (Managed Care) | Attending: Orthopedic Surgery | Admitting: Orthopedic Surgery

## 2023-03-30 ENCOUNTER — Other Ambulatory Visit: Payer: Self-pay

## 2023-03-30 ENCOUNTER — Encounter: Payer: Self-pay | Admitting: Orthopedic Surgery

## 2023-03-30 VITALS — BP 144/83 | HR 61 | Ht 71.0 in | Wt 175.0 lb

## 2023-03-30 DIAGNOSIS — M5126 Other intervertebral disc displacement, lumbar region: Secondary | ICD-10-CM

## 2023-03-30 NOTE — Progress Notes (Signed)
Patient was in [place] several months ago and noted the acute onset of back and right upper leg pain.  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  He was treated nonoperatively with an L3 selective nerve root injection and medications and exercises.  He is here today for an opinion.  He is not complaining of significant leg pain or back pain at this time.    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      Enlarged prostate with lower urinary tract symptoms (LUTS) 01/26/2017    Environmental allergies 05.25.18    Extrinsic allergic asthma 11/28/2012    Drenda Freeze follows, usually on max therapy in anticipation of bad ragweed season.     HLD (hyperlipidemia)     Hyperlipidemia 05/30/2008    Normal TG, just normal HDL, and LDL as high as 150's at times. No premature CVD in family, mother with CAD late. Started statin in 2014, supplementing with coenzyme Q10 to minimize muscle side effects.     Incomplete Right Bundle Branch Block 05/30/2008          Multiple sclerosis     Osteoarthritis of right knee 02/01/2014    Psoriasis     S/P R TKA 07/31/2019 07/31/2019    Thoracic aortic aneurysm (TAA) 02/22/2009    mild- 4.0 cm  by CT scan 5/10 See 6/13 echo at [facility] describing no change, plan for 24 month f/u See [id number] [facility] note. See 04/06/16 echo ([facility]) mild.     Tic disorder      On physical examination has a normal gait a full range of motion of his back.  Straight leg raising is normal.  He does have numbness in both feet longstanding from MS.  He has no numbness in the right L3 dermatomal distribution.  He is normoreflexic at both knees.  He has 5-5 strength throughout.    Imaging studies I reviewed his cervical MRI which shows some evidence of disc degeneration  without significant central stenosis or cord impingement.  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  We talked about the natural history of disc herniations emphasizing that 90% typically get better with nonoperative measures in 4 to 6 months time.  The patient is feeling better at this time.  I encouraged him to continue activities as tolerated.  Should his symptoms recur I recommend another right L3 selective nerve root injection.    I answered all his questions to his satisfaction.  He will follow-up on as-needed basis.

## 2023-04-02 ENCOUNTER — Telehealth: Payer: Self-pay | Admitting: Surgery

## 2023-04-02 NOTE — Telephone Encounter (Signed)
Called pt to remind them of 7/5 procedure. Pt confirmed.

## 2023-04-04 ENCOUNTER — Other Ambulatory Visit: Payer: Self-pay

## 2023-04-08 ENCOUNTER — Other Ambulatory Visit: Payer: Self-pay | Admitting: Neurology

## 2023-04-08 DIAGNOSIS — G35 Multiple sclerosis: Secondary | ICD-10-CM

## 2023-04-08 NOTE — Telephone Encounter (Signed)
Rx  Refill  Request    

## 2023-04-09 ENCOUNTER — Other Ambulatory Visit: Payer: Medicare (Managed Care) | Admitting: Radiology

## 2023-04-09 MED ORDER — DALFAMPRIDINE 10 MG PO TB12 *I*
10.0000 mg | ORAL_TABLET | Freq: Two times a day (BID) | ORAL | 5 refills | Status: DC
Start: 2023-04-09 — End: 2023-09-24

## 2023-04-09 NOTE — Telephone Encounter (Signed)
Date of last visit and plan for FUV: 02/24/23  Date of FUV? 09/01/23  Date of last MRI and plan for next: 07/01/22  Is MRI ordered? Ordered- expected 02/2024  DMT (including alternative dosing if applicable): ocrevus    Labs:      Lab results: 02/18/23  1315   Sodium 140   Potassium 4.3   Chloride 102   CO2 25   UN 17   Creatinine 1.13   Glucose 131*   Calcium 9.3           Lab results: 02/18/23  1315   Total Protein 6.1*   Albumin 4.3   ALT 35   AST 38   Alk Phos 81   Bilirubin,Total 1.0           Lab results: 02/18/23  1315 05/25/22  1316   WBC 6.4 5.1   Hemoglobin 14.4 14.3   Hematocrit 41 43   RBC 4.7 4.9   Platelets 193 184   Neut # K/uL 4.3 3.2   Lymph # K/uL 1.3 1.2*   Mono # K/uL 0.6 0.6   Eos # K/uL 0.1 0.1   Baso # K/uL 0.0 0.0   Seg Neut % 67.7 62.0   Lymphocyte %  --  22.6   Monocyte %  --  12.3   Eosinophil %  --  2.1   Basophil %  --  0.8

## 2023-04-16 ENCOUNTER — Ambulatory Visit: Admit: 2023-04-16 | Payer: Self-pay | Admitting: Surgery

## 2023-04-16 ENCOUNTER — Ambulatory Visit
Admission: RE | Admit: 2023-04-16 | Discharge: 2023-04-16 | Disposition: A | Discharge status: Home / Self Care | Payer: Medicare (Managed Care) | Source: Ambulatory Visit | Attending: Surgery | Admitting: Surgery

## 2023-04-16 ENCOUNTER — Encounter: Payer: Self-pay | Admitting: Surgery

## 2023-04-16 ENCOUNTER — Other Ambulatory Visit: Payer: Self-pay

## 2023-04-16 DIAGNOSIS — Z1211 Encounter for screening for malignant neoplasm of colon: Secondary | ICD-10-CM | POA: Insufficient documentation

## 2023-04-16 DIAGNOSIS — K635 Polyp of colon: Secondary | ICD-10-CM

## 2023-04-16 DIAGNOSIS — D125 Benign neoplasm of sigmoid colon: Secondary | ICD-10-CM | POA: Insufficient documentation

## 2023-04-16 DIAGNOSIS — K648 Other hemorrhoids: Secondary | ICD-10-CM | POA: Insufficient documentation

## 2023-04-16 DIAGNOSIS — D124 Benign neoplasm of descending colon: Secondary | ICD-10-CM | POA: Insufficient documentation

## 2023-04-16 SURGERY — COLONOSCOPY

## 2023-04-16 MED ORDER — FENTANYL CITRATE 50 MCG/ML IJ SOLN *WRAPPED*
INTRAMUSCULAR | Status: AC | PRN
Start: 2023-04-16 — End: 2023-04-16
  Administered 2023-04-16 (×2): 50 ug via INTRAVENOUS

## 2023-04-16 MED ORDER — FENTANYL CITRATE 50 MCG/ML IJ SOLN *WRAPPED*
INTRAMUSCULAR | Status: AC
Start: 2023-04-16 — End: 2023-04-16
  Filled 2023-04-16: qty 2

## 2023-04-16 MED ORDER — DEXTROSE 5 % FLUSH FOR PUMPS *I*
0.0000 mL/h | INTRAVENOUS | Status: DC | PRN
Start: 2023-04-16 — End: 2023-04-17

## 2023-04-16 MED ORDER — SODIUM CHLORIDE 0.9 % FLUSH FOR PUMPS *I*
0.0000 mL/h | INTRAVENOUS | Status: DC | PRN
Start: 2023-04-16 — End: 2023-04-17

## 2023-04-16 MED ORDER — MIDAZOLAM HCL 1 MG/ML IJ SOLN *I* WRAPPED
INTRAMUSCULAR | Status: AC | PRN
Start: 2023-04-16 — End: 2023-04-16
  Administered 2023-04-16 (×2): 2 mg via INTRAVENOUS
  Administered 2023-04-16: 1 mg via INTRAVENOUS

## 2023-04-16 MED ORDER — SODIUM CHLORIDE 0.9 % IV SOLN WRAPPED *I*
50.0000 mL/h | Status: DC
Start: 2023-04-16 — End: 2023-04-17
  Administered 2023-04-16: 50 mL/h via INTRAVENOUS

## 2023-04-16 MED ORDER — MIDAZOLAM HCL 1 MG/ML IJ SOLN *I* WRAPPED
INTRAMUSCULAR | Status: AC
Start: 2023-04-16 — End: 2023-04-16
  Filled 2023-04-16: qty 5

## 2023-04-16 NOTE — Discharge Instructions (Addendum)
Discharge Instructions for Colonoscopy      04/16/2023    11:13 AM    Colonoscopy    Do not drive, operate heavy machinery, drink alcoholic beverages, make important personal or business decisions, or sign legal documents until the next day.     Return to your usual diet  Return to taking your usual medications    Things you may expect:  A small amount of bright red blood in your stool  It may be a few days before you have a bowel movement  You may have cramping, bloating, and feelings of "gas". These feelings should go away as you pass gas. If you still feel uncomfortable, walking around will help to pass the gas.  You were given medication to help you relax during the test. You may feel "fuzzy" and drowsy. Go home and rest for at least 4-6 hours.    You should call your doctor for any of the following:  Bad stomach pain  Fever  Bright red bleeding or clots (This may happen up to 20 days after the test.)  Dizziness or weakness that gets worse or lasts up to 24 hours.  Pain or redness at the IV site    If you have a serious problem after hours, Call [phone] to reach the physician on call. If you are unable to reach your doctor, go to the [facility] Emergency Department.    Follow Up Care:  Report will be sent to your primary doctor. You had 3 small polyps removed    New Prescriptions    No medications on file

## 2023-04-16 NOTE — Interval H&P Note (Signed)
UPDATES TO PATIENT'S CONDITION on the DAY OF SURGERY/PROCEDURE    I. Updates to Patient's Condition (to be completed by a provider privileged to complete a H&P, following reassessment of the patient by the provider):    Day of Surgery/Procedure Update:  History  History reviewed and no change    Physical  Physical exam updated and no change    Pre-Procedure Assessment  Airway Visibility: Posterior pharynx  Loose/Broken Teeth, Oral Piercings: Not Applicable  Lungs: Clear  Heart sounds rhythm: Regular Rate Rhythm  ASA Physical status rating: Class II: Mild systemic disease       II. Procedure Readiness   I have reviewed the patient's H&P and updated condition. By completing and signing this form, I attest that this patient is ready for surgery/procedure.    III. Attestation   I have reviewed the updated information regarding the patient's condition and it is appropriate to proceed with the planned surgery/procedure.      Matilde Sprang Bralin Garry, MD as of 11:13 AM 04/16/2023

## 2023-04-16 NOTE — Preop H&P (Signed)
Chief Complaint  None    History of Present Illness  72 y.o. M with no significant PMHx who presents for screening colonoscopy. His last colonoscopy was 04/30/2015 that revealed moderate diverticulosis and internal hemorrhoids with recommendation for repeat in 10 years. No family history of colon or rectal cancer. He denies abdominal pain, change in bowel habits or blood in stools.       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      Enlarged prostate with lower urinary tract symptoms (LUTS) 01/26/2017    Environmental allergies 05.25.18    Extrinsic allergic asthma 11/28/2012    Drenda Freeze follows, usually on max therapy in anticipation of bad ragweed season.     HLD (hyperlipidemia)     Hyperlipidemia 05/30/2008    Normal TG, just normal HDL, and LDL as high as 150's at times. No premature CVD in family, mother with CAD late. Started statin in 2014, supplementing with coenzyme Q10 to minimize muscle side effects.     Incomplete Right Bundle Branch Block 05/30/2008          Multiple sclerosis     Osteoarthritis of right knee 02/01/2014    Psoriasis     S/P R TKA 07/31/2019 07/31/2019    Thoracic aortic aneurysm (TAA) 02/22/2009    mild- 4.0 cm  by CT scan 5/10 See 6/13 echo at [facility] describing no change, plan for 24 month f/u See [id number] [facility] note. See 04/06/16 echo ([facility]) mild.     Tic disorder      Past Surgical History:   Procedure Laterality Date    COLONOSCOPY      HERNIA REPAIR      HX TONSILLECTOMY/ADENOIDECTOMY      INCISIONAL HERNIA REPAIR      JOINT REPLACEMENT  07/30/2028    Right knee replacement    KNEE ARTHROSCOPY Right     OTHER SURGICAL HISTORY      MS    PR ARTHRP KNE CONDYLE&PLATU MEDIAL&LAT COMPARTMENTS Right 07/31/2019    Procedure: RIGHT TOTAL KNEE ARTHROPLASTY;  Surgeon:  Laurance Flatten, MD;  Location: [facility];  Service: Orthopedics    VASECTOMY       No Known Allergies (drug, envir, food or latex)  Current Outpatient Medications   Medication    dalfampridine (AMPYRA) 10 MG tablet    Calcium Citrate-Vitamin D (CALCIUM CITRATE +D PO)    tadalafil (CIALIS) 5 MG tablet    atorvastatin (LIPITOR) 40 mg tablet    ocrelizumab (OCREVUS) 300 MG/10ML injection    ascorbic acid (VITAMIN C) 100 MG tablet    Cyanocobalamin (VITAMIN B-12 CR PO)     No current facility-administered medications for this visit.     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 Has  2 older children.        Exercise -- doubles tennis or pickle ball up to 5 times weekly, joined [employer] and plans to add some resistance machines.        Diet -- varied, more lean proteins, complex carbs, not a lot of dessert, some greens.  Protein shakes at bkfst and lunch, flax, seeds, yogurt.        Sleep -- active dreams, variable sleep success.  No GU interruption.        Safety -- seatbelt and no distractions, smoke detectors, sunglasses, plans to be more consistent with skin block.       Family History   Problem Relation Age of Onset    Heart Disease Mother         coronary art disease, not premature    Cancer Father         multiple myeloma    Heart Disease Brother         aneurism-07-09-18    Cancer Maternal Grandmother         unknown    Multiple Sclerosis Neg Hx     Lupus Neg Hx     Rheum arthritis Neg Hx     Thyroid disease Neg Hx     Diabetes Neg Hx     Colon cancer Neg Hx     Colon polyps Neg Hx     High Blood Pressure Neg Hx     Rectal cancer Neg Hx          Review of Systems:  Pertinent items are  noted in HPI.    Physical Exam:  There were no vitals filed for this visit.  Neuro: A+O x 3  Chest: CTA, breathing unlabored   Cardiac: RRR  Abdomen is soft, nontender, nondistended with no palpable masses.     Assessment and Plan:  I discussed the risks, benefits, and alternatives for the procedure.   Plan for colonoscopy today

## 2023-04-16 NOTE — Procedures (Signed)
Colonoscopy Procedure Note   Date of Procedure: 04/16/2023   Referring Physician: Galen Daft, *  Primary Physician: Galen Daft, MD        Attending Physician: Terressa Koyanagi, MD  Indications:  Colorectal cancer screening-average risk  Previous colonoscopy: Yes -- Date: 2016, normal  Medications: Fentanyl 100 mcg IV and Midazolam 5 mg IV were administered incrementally over the course of the procedure to achieve an adequate level of conscious sedation.  Sedation Face Times  Start Time: 1116  End Time: 1152  Duration (minutes): 36 Minutes       Scopes:  Olympus Adult scope    Procedure Details: Full disclosure of risks were reviewed with patient as detailed on the consent form. The patient was placed in the left lateral decubitus position, sedated and the anal canal was lubricated.   After anorectal examination was performed, the colonoscope was inserted into the rectum and advanced under direct vision to the cecum, which was identified by  the ileocecal valve and the appendiceal orifice. The cecum was fully inflated, allowing a complete view, including the medial wall between the IC valve and appendiceal orifice.     The scope was then slowly and carefully withdrawn from the cecum to the rectum.  During withdrawal examination, the final quality of the prep was [place] Bowel Prep Scale:    Right Colon: Grade3- (entire mucosa of colon segment seen well, with no residual staining, small fragments of stool, or opaque liguid)    Transverse Colon: Grade 3- (entire mucosa of colon segment seen well, with no residual staining, small fragments of stool, or opaque liguid)    Left Colon: Grade 3- (entire mucosa of colon segment seen well, with no residual staining, small fragments of stool, or opaque liguid)    A careful inspection was made as the colonoscope was withdrawn. A retroflexed view of the rectum was performed; findings and interventions are described below.The patient tolerated the procedure well  and there were no immediate complications.        Insertion Time: 1120  Cecum Time: 1138  Exit Time: 1150    Findings:   Anorectal/digital rectal exam:  Normal tone, no masses  Terminal Ileum:  Not evaluated  Cecum:   Normal mucosa  Ascending Colon:  Normal mucosa  Transverse Colon:   Normal mucosa    Descending Colon:   1 polyp(s) 3 mm in size,  pedunculated, removed by cold biopsy and retrieved and sent to pathology  Sigmoid Colon:   1 polyp(s) 1 mm in size,  pedunculated, removed by cold biopsy and retrieved and sent to pathology  1 polyp(s) 4 mm in size,  pedunculated, removed by cold snare and retrieved and sent to pathology  Rectum:   Normal rectum throughout, internal hemorrhoids    EBL: <55mL    Interventions:   2 polyp(s) removed by cold biopsy  1 polyp(s) removed by cold snare biopsy      Impression:  Multiple polyps as removed above.    Recommendations:   No further colonoscopies for screening surveillance are recommended for this patient due to age but if in good health in 5-7 years would consider surveillance testing of some kind.    I provided moderate sedation.  During this time I was face-to-face with the patient and supervising the RN; who monitored the patient's level of consciousness and physiological status.     Matilde Sprang Cristian Davitt, MD

## 2023-04-16 NOTE — H&P (View-Only) (Signed)
Chief Complaint  None    History of Present Illness  72 y.o. M with no significant PMHx who presents for screening colonoscopy. His last colonoscopy was 04/30/2015 that revealed moderate diverticulosis and internal hemorrhoids with recommendation for repeat in 10 years. No family history of colon or rectal cancer. He denies abdominal pain, change in bowel habits or blood in stools.       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      Enlarged prostate with lower urinary tract symptoms (LUTS) 01/26/2017    Environmental allergies 05.25.18    Extrinsic allergic asthma 11/28/2012    Doug Dog[place] follows, usually on max therapy in anticipation of bad ragweed season.     HLD (hyperlipidemia)     Hyperlipidemia 05/30/2008    Normal TG, just normal HDL, and LDL as high as 150's at times. No premature CVD in family, mother with CAD late. Started statin in 2014, supplementing with coenzyme Q10 to minimize muscle side effects.     Incomplete Right Bundle Branch Block 05/30/2008          Multiple sclerosis     Osteoarthritis of right knee 02/01/2014    Psoriasis     S/P R TKA 07/31/2019 07/31/2019    Thoracic aortic aneurysm (TAA) 02/22/2009    mild- 4.0 cm  by CT scan 5/10 See 6/13 [facility] describing no change, plan for 24 month f/u See [id number] [facility] note. See 04/06/16 echo ([facility]) mild.     Tic disorder      Past Surgical History:   Procedure Laterality Date    COLONOSCOPY      HERNIA REPAIR      HX TONSILLECTOMY/ADENOIDECTOMY      INCISIONAL HERNIA REPAIR      JOINT REPLACEMENT  07/30/2028    Right knee replacement    KNEE ARTHROSCOPY Right     OTHER SURGICAL HISTORY      MS    PR ARTHRP KNE CONDYLE&PLATU MEDIAL&LAT COMPARTMENTS Right 07/31/2019    Procedure: RIGHT TOTAL KNEE ARTHROPLASTY;  Surgeon:  Ginnetti, John G, MD;  Location: [facility];  Service: Orthopedics    VASECTOMY       No Known Allergies (drug, envir, food or latex)  Current Outpatient Medications   Medication    dalfampridine (AMPYRA) 10 MG tablet    Calcium Citrate-Vitamin D (CALCIUM CITRATE +D PO)    tadalafil (CIALIS) 5 MG tablet    atorvastatin (LIPITOR) 40 mg tablet    ocrelizumab (OCREVUS) 300 MG/10ML injection    ascorbic acid (VITAMIN C) 100 MG tablet    Cyanocobalamin (VITAMIN B-12 CR PO)     No current facility-administered medications for this visit.     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        Exercise -- doubles tennis or pickle ball up to 5 times weekly, joined [employer] and plans to add some resistance machines.        Diet -- varied, more lean proteins, complex carbs, not a lot of dessert, some greens.  Protein shakes at bkfst and lunch, flax, seeds, yogurt.        Sleep -- active dreams, variable sleep success.  No GU interruption.        Safety -- seatbelt and no distractions, smoke detectors, sunglasses, plans to be more consistent with skin block.       Family History   Problem Relation Age of Onset    Heart Disease Mother         coronary art disease, not premature    Cancer Father         multiple myeloma    Heart Disease Brother         aneurism-07-09-18    Cancer Maternal Grandmother         unknown    Multiple Sclerosis Neg Hx     Lupus Neg Hx     Rheum arthritis Neg Hx     Thyroid disease Neg Hx     Diabetes Neg Hx     Colon cancer Neg Hx     Colon polyps Neg Hx     High Blood Pressure Neg Hx     Rectal cancer Neg Hx          Review of Systems:  Pertinent items are  noted in HPI.    Physical Exam:  There were no vitals filed for this visit.  Neuro: A+O x 3  Chest: CTA, breathing unlabored   Cardiac: RRR  Abdomen is soft, nontender, nondistended with no palpable masses.     Assessment and Plan:  I discussed the risks, benefits, and alternatives for the procedure.   Plan for colonoscopy today

## 2023-04-19 ENCOUNTER — Encounter: Payer: Self-pay | Admitting: Surgery

## 2023-04-19 ENCOUNTER — Other Ambulatory Visit: Payer: Self-pay | Admitting: Neurology

## 2023-04-19 DIAGNOSIS — G35 Multiple sclerosis: Secondary | ICD-10-CM

## 2023-04-19 LAB — SURGICAL PATHOLOGY

## 2023-04-19 NOTE — Telephone Encounter (Signed)
Rx clarification request- Rob Bunting originally prescribed is less than allowed on pt rx plan. Increasing days supply may allow pt to take full advantage of plan benefits.

## 2023-04-19 NOTE — Telephone Encounter (Signed)
Called [facility]-they are requesting a 90 day supply for patient's dalfampridine. Provided verbal orders for the 90 day supply. Will follow up if anything else is needed.

## 2023-05-02 ENCOUNTER — Other Ambulatory Visit: Payer: Self-pay | Admitting: Family Medicine

## 2023-05-03 ENCOUNTER — Ambulatory Visit: Payer: Medicare (Managed Care) | Admitting: Surgery

## 2023-05-03 ENCOUNTER — Other Ambulatory Visit: Payer: Self-pay

## 2023-05-03 ENCOUNTER — Encounter: Payer: Self-pay | Admitting: Surgery

## 2023-05-03 VITALS — BP 131/77 | HR 68 | Temp 97.7°F | Resp 18 | Ht 71.0 in | Wt 175.0 lb

## 2023-05-03 DIAGNOSIS — K649 Unspecified hemorrhoids: Secondary | ICD-10-CM

## 2023-05-03 MED ORDER — TADALAFIL 5 MG PO TABS *I*
5.0000 mg | ORAL_TABLET | Freq: Every day | ORAL | 0 refills | Status: DC
Start: 2023-05-03 — End: 2023-07-07

## 2023-05-03 NOTE — Telephone Encounter (Signed)
Last office visit:   02/22/2023  Patients upcoming appointments:  Future Appointments   Date Time Provider Department Center   05/03/2023  3:30 PM Poles, Matilde Sprang, MD [facility] None   08/23/2023  8:00 AM [facility] None   09/01/2023 10:00 AM Gaetana Michaelis, MD [facility] None   02/22/2024  9:20 AM Kriste Basque, Gaye Pollack, MD [facility] None     Recent Lab results:  GENERAL CHEMISTRY   Recent Labs     02/18/23  1315 05/25/22  1316   NA 140 140   K 4.3 CANCELED   CL 102 104   CO2 25 23   GAP 13 13   UN 17 18   CREAT 1.13 0.93   GLU 131* 125*   CA 9.3 9.2      LIPID PROFILE   Recent Labs     03/01/23  1146   CHOL 107   TRIG 54   HDL 58   LDLC 38      LIVER PROFILE   Recent Labs     02/18/23  1315 05/25/22  1316   ALT 35 37   AST 38 CANCELED   ALK 81 83   TB 1.0 0.9      DIABETES THYROID   Recent Labs     03/01/23  1146   HA1C 5.1    No value within the past 365 days      Pending/Orders Labs:  Lab Frequency Next Occurrence   DEXA Scan Once 02/22/2023   MR head without contrast Once 02/11/2024   MR spine thoracic without contrast Once 02/11/2024

## 2023-05-04 NOTE — Progress Notes (Signed)
PROCEDURE NOTE  The risks, benefits and alternatives were discussed with the patient who agreed to proceed.    Procedure Rubber band ligation of hemorrhoids    Indications  Prolapsing internal hemorrhoids.     Procedure Details   The patient was placed in the left lateral position.  An anoscopy was performed with normal mucosa and large hemorrhoid in the right posterior position.  One rubber band was used to ligate hemorrhoidal tissue  in the anal canal above the dentate line in the right posterior position.  The patient tolerated the procedure well.     Condition good    Postop instructions given  Follow up in 1 month

## 2023-05-07 ENCOUNTER — Other Ambulatory Visit
Admission: RE | Admit: 2023-05-07 | Discharge: 2023-05-07 | Disposition: A | Discharge status: Home / Self Care | Payer: Medicare (Managed Care) | Source: Ambulatory Visit | Attending: Chiropractic Medicine | Admitting: Chiropractic Medicine

## 2023-05-07 DIAGNOSIS — G629 Polyneuropathy, unspecified: Secondary | ICD-10-CM | POA: Insufficient documentation

## 2023-05-07 LAB — VITAMIN B12: Vitamin B12: 1151 pg/mL (ref 232–1245)

## 2023-05-07 LAB — FOLATE: Folate: 7.4 ng/mL (ref >=4.6)

## 2023-05-11 ENCOUNTER — Encounter: Payer: Self-pay | Admitting: Surgery

## 2023-05-12 ENCOUNTER — Other Ambulatory Visit: Payer: Self-pay

## 2023-05-12 ENCOUNTER — Ambulatory Visit: Payer: Medicare (Managed Care) | Admitting: Family Medicine

## 2023-05-12 ENCOUNTER — Encounter: Payer: Self-pay | Admitting: Family Medicine

## 2023-05-12 VITALS — BP 100/60 | HR 66 | Temp 96.9°F | Wt 171.8 lb

## 2023-05-12 DIAGNOSIS — R0982 Postnasal drip: Secondary | ICD-10-CM

## 2023-05-12 LAB — VITAMIN B6: Vitamin B6: 63.3 nmol/L (ref 20.0–125.0)

## 2023-05-12 LAB — METHYLMALONIC ACID, SERUM: Methylmalonic Acid: 0.16 umol/L (ref 0.00–0.40)

## 2023-05-12 MED ORDER — AZELASTINE HCL 0.15 % NA SOLN *I*
1.0000 | Freq: Every day | NASAL | 0 refills | Status: DC
Start: 2023-05-12 — End: 2023-08-30

## 2023-05-12 NOTE — Progress Notes (Signed)
Subjective:   [name] is a 72 y.o. male with   Past Medical History:   Diagnosis Date   . Asthma    . 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     . Enlarged prostate with lower urinary tract symptoms (LUTS) 01/26/2017   . Environmental allergies 05.25.18   . Extrinsic allergic asthma 11/28/2012    Drenda Freeze follows, usually on max therapy in anticipation of bad ragweed season.    Marland Kitchen HLD (hyperlipidemia)    . Hyperlipidemia 05/30/2008    Normal TG, just normal HDL, and LDL as high as 150's at times. No premature CVD in family, mother with CAD late. Started statin in 2014, supplementing with coenzyme Q10 to minimize muscle side effects.    . Incomplete Right Bundle Branch Block 05/30/2008         . Multiple sclerosis    . Osteoarthritis of right knee 02/01/2014   . Psoriasis    . S/P R TKA 07/31/2019 07/31/2019   . Thoracic aortic aneurysm (TAA) 02/22/2009    mild- 4.0 cm  by CT scan 5/10 See 6/13 echo at [facility] describing no change, plan for 24 month f/u See [id number] [facility] note. See 04/06/16 echo ([facility]) mild.    . Tic disorder         Presented to the office today for:  Chief Complaint   Patient presents with   . Follow-up     Dry cough for months, no other sysmptoms       HPI    Antwione's been having a cough, dry in nature, intermittent, comes and goes with no rhyme or reason, not associated with eating, doesn't smoke, not on a lisinopril    Does not seem to have GERD or heartburn symptoms.    Had allergy work up done in the past and negative.    Likely a form of post nasal drip/chronic non allergic rhinitis          Review of Systems   Respiratory:  Positive for cough. Negative for apnea, choking, chest tightness, shortness of breath and wheezing.                The patient has a   Family History   Problem Relation Age of Onset   . Heart Disease  Mother         coronary art disease, not premature   . Cancer Father         multiple myeloma   . Heart Disease Brother         aneurism-07-09-18   . Cancer Maternal Grandmother         unknown   . Multiple Sclerosis Neg Hx    . Lupus Neg Hx    . Rheum arthritis Neg Hx    . Thyroid disease Neg Hx    . Diabetes Neg Hx    . Colon cancer Neg Hx    . Colon polyps Neg Hx    . High Blood Pressure Neg Hx    . Rectal cancer Neg Hx        Objective:   BP 100/60 (BP Location: Left arm, Patient Position: Sitting)   Pulse 66   Temp 36.1 C (96.9 F) (Temporal)   Wt 77.9 kg (171 lb 12.8 oz)   SpO2 98%  BMI 23.96 kg/m    BP Readings from Last 3 Encounters:   05/12/23 100/60   05/03/23 131/77   04/16/23 109/82       Physical Exam  Cardiovascular:      Rate and Rhythm: Normal rate and regular rhythm.      Pulses: Normal pulses.      Heart sounds: Normal heart sounds.   Pulmonary:      Effort: Pulmonary effort is normal. No respiratory distress.      Breath sounds: Normal breath sounds. No wheezing.     Lab Results   Component Value Date    WBC 6.4 02/18/2023    HGB 14.4 02/18/2023    HCT 41 02/18/2023    PLT 193 02/18/2023    CHOL 107 03/01/2023    TRIG 54 03/01/2023    HDL 58 03/01/2023    ALT 35 02/18/2023    AST 38 02/18/2023    NA 140 02/18/2023    K 4.3 02/18/2023    CL 102 02/18/2023    CO2 25 02/18/2023    TSH 3.91 05/19/2019    INR 1.1 07/10/2019     No components found for: "CALCIUM", "PHOS"  No components found for: "LDLCALC", "LDLCHOLESTEROL", "LDLDIRECT"    Assessment and Plan:     1. Post-nasal drip  - will start astepro nasal spray for 2 months, if no improvement, will get CXR and refer to pulm  - azelastine (ASTEPRO) 0.15 % nasal spray; Spray 1-2 sprays into each nostril daily.  Dispense: 30 mL; Refill: 0            Requested Prescriptions     Signed Prescriptions Disp Refills   . azelastine (ASTEPRO) 0.15 % nasal spray 30 mL 0     Sig: Spray 1-2 sprays into each nostril daily.       There are no discontinued  medications.    [name] received counseling on the following healthy behaviors: nutrition, exercise and medication adherence    Discussed use,benefit, and side effects of prescribed medications.  Barriers to medication compliance addressed.      All patient questions answered.  Pt voiced understanding.     No follow-ups on file.        Disclaimer: Some orall of this note was transcribed using voice-recognition software.This may cause typographical errors occasionally. Although all effort is made to fix these errors, please do not hesitate to contact our office if there isany concern with the understanding of this note.

## 2023-05-17 ENCOUNTER — Encounter: Payer: Self-pay | Admitting: Neurology

## 2023-05-18 NOTE — Telephone Encounter (Signed)
Will defer pt's MyChart message to Dr. Robb for recommendations.  See MyChart messages for further details.

## 2023-06-02 ENCOUNTER — Telehealth: Payer: Self-pay | Admitting: Family Medicine

## 2023-06-02 ENCOUNTER — Encounter: Payer: Self-pay | Admitting: Family Medicine

## 2023-06-02 ENCOUNTER — Other Ambulatory Visit: Payer: Self-pay | Admitting: Family Medicine

## 2023-06-02 DIAGNOSIS — U071 COVID-19: Secondary | ICD-10-CM

## 2023-06-02 MED ORDER — NIRMATRELVIR & RITONAVIR 150-100 MG PO TABS *I*
ORAL_TABLET | ORAL | 0 refills | Status: AC
Start: 2023-06-02 — End: 2023-06-07

## 2023-06-02 MED ORDER — GUAIFENESIN-CODEINE 100-10 MG/5ML PO SYRP *I*
5.0000 mL | ORAL_SOLUTION | Freq: Three times a day (TID) | ORAL | 0 refills | Status: DC | PRN
Start: 2023-06-02 — End: 2023-08-30

## 2023-06-02 MED ORDER — NIRMATRELVIR & RITONAVIR 150-100 MG PO TABS *I*
ORAL_TABLET | ORAL | 0 refills | Status: DC
Start: 2023-06-02 — End: 2023-06-02

## 2023-06-02 MED ORDER — GUAIFENESIN-CODEINE 100-10 MG/5ML PO SYRP *I*
5.0000 mL | ORAL_SOLUTION | Freq: Three times a day (TID) | ORAL | 0 refills | Status: DC | PRN
Start: 2023-06-02 — End: 2023-06-02

## 2023-06-02 MED ORDER — BENZONATATE 200 MG PO CAPS *I*
200.0000 mg | ORAL_CAPSULE | Freq: Three times a day (TID) | ORAL | 0 refills | Status: DC | PRN
Start: 2023-06-02 — End: 2023-08-30

## 2023-06-02 MED ORDER — BENZONATATE 200 MG PO CAPS *I*
200.0000 mg | ORAL_CAPSULE | Freq: Three times a day (TID) | ORAL | 0 refills | Status: DC | PRN
Start: 2023-06-02 — End: 2023-06-02

## 2023-06-02 NOTE — Telephone Encounter (Signed)
[facility] called stating that they do not have the Paxlovid and patient is requesting that the three medications that were sent in today be sent to [facility] [place].      Please resend the following.         Last office visit:   05/12/2023  Patients upcoming appointments:  Future Appointments   Date Time Provider Department Center   08/23/2023  8:00 AM [facility] None   09/01/2023 10:00 AM Gaetana Michaelis, MD [facility] None   02/22/2024  9:20 AM Kriste Basque, Gaye Pollack, MD [facility] None     Recent Lab results:  GENERAL CHEMISTRY   Recent Labs     02/18/23  1315   NA 140   K 4.3   CL 102   CO2 25   GAP 13   UN 17   CREAT 1.13   GLU 131*   CA 9.3      LIPID PROFILE   Recent Labs     03/01/23  1146   CHOL 107   TRIG 54   HDL 58   LDLC 38      LIVER PROFILE   Recent Labs     02/18/23  1315   ALT 35   AST 38   ALK 81   TB 1.0      DIABETES THYROID   Recent Labs     03/01/23  1146   HA1C 5.1    No value within the past 365 days      Pending/Orders Labs:  Lab Frequency Next Occurrence   DEXA Scan Once 02/22/2023   MR head without contrast Once 02/11/2024   MR spine thoracic without contrast Once 02/11/2024

## 2023-06-02 NOTE — Telephone Encounter (Signed)
Pt called stating the following script was sent to the wrong pharmacy.    nirmatrelvir & ritonavir (PAXLOVID) 300mg  (150mg -150mg ) & 100mg  tablet therapy pack 30 tablet 0 06/02/2023 06/07/2023    Sig: for Infection due to Human Coronavirus. Take nirmatrelvir 300mg  (2 tablets) with ritonavir 100mg  (1 tablet) twice daily by mouth for 5 days.      Please send script [facility] - [place], [place] [address]

## 2023-06-02 NOTE — Telephone Encounter (Signed)
Patient requesting [facility], previous Rx sent to [place].

## 2023-06-02 NOTE — Telephone Encounter (Signed)
[name] tested positive for covid this morning with symptoms starting yesterday. He is experiencing a bad cough, congested, body aches, and a headache. He also reports a temp of 99.9. Due to have multiple sclerosis he asking about getting an antiviral treatment.

## 2023-06-02 NOTE — Telephone Encounter (Signed)
Patient calls the office stating last time he was on Paxlovid he had a rebound and is wanting to know if provider is able to call in a different medication.

## 2023-06-04 NOTE — Telephone Encounter (Signed)
Sore throat is not a common side effect from paxlovid but can be from COVID itself. I would recommend warm liquids, and lozenges such as halls or ricolo which can provide pain relief.

## 2023-06-30 ENCOUNTER — Encounter: Payer: Self-pay | Admitting: Family Medicine

## 2023-06-30 DIAGNOSIS — R053 Chronic cough: Secondary | ICD-10-CM

## 2023-07-07 ENCOUNTER — Other Ambulatory Visit: Payer: Self-pay | Admitting: Primary Care

## 2023-07-07 NOTE — Telephone Encounter (Signed)
Last office visit:   05/12/2023  Patients upcoming appointments:  Future Appointments   Date Time Provider Department Center   08/23/2023  8:00 AM [facility] None   09/01/2023 10:00 AM Gaetana Michaelis, MD [facility] None   02/22/2024  9:20 AM Kriste Basque, Gaye Pollack, MD [facility] None     Recent Lab results:  GENERAL CHEMISTRY   Recent Labs     02/18/23  1315   NA 140   K 4.3   CL 102   CO2 25   GAP 13   UN 17   CREAT 1.13   GLU 131*   CA 9.3      LIPID PROFILE   Recent Labs     03/01/23  1146   CHOL 107   TRIG 54   HDL 58   LDLC 38      LIVER PROFILE   Recent Labs     02/18/23  1315   ALT 35   AST 38   ALK 81   TB 1.0      DIABETES THYROID   Recent Labs     03/01/23  1146   HA1C 5.1    No value within the past 365 days      Pending/Orders Labs:  Lab Frequency Next Occurrence   DEXA Scan Once 02/22/2023   MR head without contrast Once 02/11/2024   MR spine thoracic without contrast Once 02/11/2024   *Chest standard frontal and lateral views Once 07/01/2023

## 2023-07-12 ENCOUNTER — Encounter: Payer: Self-pay | Admitting: Family Medicine

## 2023-07-12 MED ORDER — ZOLPIDEM TARTRATE 12.5 MG PO TBCR *I*
12.5000 mg | ORAL_TABLET | Freq: Every evening | ORAL | 0 refills | Status: DC | PRN
Start: 2023-07-12 — End: 2024-03-01

## 2023-07-12 NOTE — Telephone Encounter (Signed)
Last office visit:   05/12/2023  Patients upcoming appointments:  Future Appointments   Date Time Provider Department Center   08/23/2023  8:00 AM [facility] None   09/01/2023 10:00 AM Gaetana Michaelis, MD [facility] None   02/22/2024  9:20 AM Kriste Basque, Gaye Pollack, MD [facility] None     Recent Lab results:  GENERAL CHEMISTRY   Recent Labs     02/18/23  1315   NA 140   K 4.3   CL 102   CO2 25   GAP 13   UN 17   CREAT 1.13   GLU 131*   CA 9.3      LIPID PROFILE   Recent Labs     03/01/23  1146   CHOL 107   TRIG 54   HDL 58   LDLC 38      LIVER PROFILE   Recent Labs     02/18/23  1315   ALT 35   AST 38   ALK 81   TB 1.0      DIABETES THYROID   Recent Labs     03/01/23  1146   HA1C 5.1    No value within the past 365 days      Pending/Orders Labs:  Lab Frequency Next Occurrence   DEXA Scan Once 02/22/2023   MR head without contrast Once 02/11/2024   MR spine thoracic without contrast Once 02/11/2024   *Chest standard frontal and lateral views Once 07/01/2023

## 2023-08-10 ENCOUNTER — Ambulatory Visit
Admission: RE | Admit: 2023-08-10 | Discharge: 2023-08-10 | Disposition: A | Discharge status: Home / Self Care | Payer: Medicare (Managed Care) | Source: Ambulatory Visit | Attending: Family Medicine | Admitting: Family Medicine

## 2023-08-10 ENCOUNTER — Encounter: Payer: Self-pay | Admitting: Family Medicine

## 2023-08-10 DIAGNOSIS — R053 Chronic cough: Secondary | ICD-10-CM | POA: Insufficient documentation

## 2023-08-10 DIAGNOSIS — K219 Gastro-esophageal reflux disease without esophagitis: Secondary | ICD-10-CM

## 2023-08-11 MED ORDER — OMEPRAZOLE 20 MG PO CPDR *I*
20.0000 mg | DELAYED_RELEASE_CAPSULE | Freq: Every day | ORAL | 1 refills | Status: DC
Start: 2023-08-11 — End: 2023-08-30

## 2023-08-16 ENCOUNTER — Other Ambulatory Visit: Payer: Self-pay

## 2023-08-16 NOTE — Progress Notes (Unsigned)
Need order for ocrevus

## 2023-08-20 ENCOUNTER — Telehealth: Payer: Self-pay
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
## 2023-08-23 ENCOUNTER — Other Ambulatory Visit: Payer: Self-pay

## 2023-08-23 ENCOUNTER — Ambulatory Visit: Payer: Medicare (Managed Care)

## 2023-08-23 VITALS — BP 117/63 | HR 70 | Temp 96.9°F | Resp 16

## 2023-08-23 DIAGNOSIS — G35 Multiple sclerosis: Secondary | ICD-10-CM | POA: Insufficient documentation

## 2023-08-23 LAB — COMPREHENSIVE METABOLIC PANEL
ALT: 25 U/L (ref 0–50)
AST: 29 U/L (ref 0–50)
Albumin: 4 g/dL (ref 3.5–5.2)
Alk Phos: 90 U/L (ref 40–130)
Anion Gap: 10 (ref 7–16)
Bilirubin,Total: 0.8 mg/dL (ref 0.0–1.2)
CO2: 27 mmol/L (ref 20–28)
Calcium: 9 mg/dL (ref 8.6–10.2)
Chloride: 102 mmol/L (ref 96–108)
Creatinine: 1.08 mg/dL (ref 0.67–1.17)
Glucose: 75 mg/dL (ref 60–99)
Lab: 13 mg/dL (ref 6–20)
Potassium: 4.3 mmol/L (ref 3.3–5.1)
Sodium: 139 mmol/L (ref 133–145)
Total Protein: 5.9 g/dL — ABNORMAL LOW (ref 6.3–7.7)
eGFR BY CREAT: 72 *

## 2023-08-23 LAB — CBC AND DIFFERENTIAL
Baso # K/uL: 0 10*3/uL (ref 0.0–0.2)
Eos # K/uL: 0.1 10*3/uL (ref 0.0–0.5)
Hematocrit: 43 % (ref 37–52)
Hemoglobin: 14.2 g/dL (ref 12.0–17.0)
IMM Granulocytes #: 0 10*3/uL (ref 0.0–0.0)
IMM Granulocytes: 0.4 %
Lymph # K/uL: 1.3 10*3/uL (ref 1.0–5.0)
MCV: 87 fL (ref 75–100)
Mono # K/uL: 0.7 10*3/uL (ref 0.1–1.0)
Neut # K/uL: 3.1 10*3/uL (ref 1.5–6.5)
Nucl RBC # K/uL: 0 10*3/uL (ref 0.0–0.0)
Nucl RBC %: 0 /100{WBCs} (ref 0.0–0.2)
Platelets: 176 10*3/uL (ref 150–450)
RBC: 4.9 MIL/uL (ref 4.0–6.0)
RDW: 13.1 % (ref 0.0–15.0)
Seg Neut %: 58.7 %
WBC: 5.2 10*3/uL (ref 3.5–11.0)

## 2023-08-23 LAB — IGG: IgG: 750 mg/dL (ref 700–1600)

## 2023-08-23 LAB — IGA: IgA: 142 mg/dL (ref 70–400)

## 2023-08-23 LAB — IGM: IgM: 165 mg/dL (ref 40–230)

## 2023-08-23 MED ORDER — SODIUM CHLORIDE 0.9 % IV SOLN WRAPPED *I*
600.0000 mg | Freq: Once | INTRAVENOUS | Status: AC
Start: 2023-08-23 — End: 2023-08-23
  Administered 2023-08-23: 600 mg via INTRAVENOUS
  Filled 2023-08-23: qty 20

## 2023-08-23 MED ORDER — CETIRIZINE HCL 10 MG PO TABS *I*
10.0000 mg | ORAL_TABLET | Freq: Once | ORAL | Status: AC
Start: 2023-08-23 — End: 2023-08-23
  Administered 2023-08-23: 10 mg via ORAL

## 2023-08-23 MED ORDER — ACETAMINOPHEN 325 MG PO TABS *I*
975.0000 mg | ORAL_TABLET | Freq: Once | ORAL | Status: AC
Start: 2023-08-23 — End: 2023-08-23
  Administered 2023-08-23: 975 mg via ORAL

## 2023-08-23 MED ORDER — METHYLPREDNISOLONE SOD SUCC 125 MG IJ SOLR(62.5MG/ML) *WRAPPED*
125.0000 mg | Freq: Once | INTRAMUSCULAR | Status: AC
Start: 2023-08-23 — End: 2023-08-23
  Administered 2023-08-23: 125 mg via INTRAVENOUS

## 2023-08-23 MED ORDER — DIPHENHYDRAMINE HCL 25 MG ORAL SOLID *WRAPPED*
25.0000 mg | Freq: Once | ORAL | Status: AC
Start: 2023-08-23 — End: 2023-08-23
  Administered 2023-08-23: 25 mg via ORAL

## 2023-08-23 NOTE — Patient Instructions (Signed)
Continue with your usual medications as prescribed.    Ocrevus can increase your risk of respiratory, skin, and herpes infections, please call [phone] with any of the following:    Fevers/chills  Cough that will not go away  Signs and symptoms of respiratory infections  Cold sores/shingles/genital sores  Itching/rashes/hives     Side effects can occur up to a week after infusion.  If you experience body aches or low grade fever you can take Tylenol or Ibuprofen as prescribed. If you experience a rash or itching you can take Benadryl or any other allergy medicine you may have as prescribed. Please call the patient line at [phone] if symptoms don't go away.    You may have more chance of getting an infection. Wash hands often. Stay away from people with infections, colds, or flu. Continue to wear your mask out in public. It is highly recommended that you get a COVID-19 vaccine and booster. People who are fully vaccinated on this therapy have gotten COVID-19. If you think you may have COVID-19 get tested right away. If you are positive you may qualify for medications to lessen the severity of COVID-19. There is a limited time frame these medications can be administered so please get tested as soon as possible and let your neurologist know of a positive test result.    You should not receive any live vaccines while on Ocrevus.  Please check with your MD first. Be sure to have regular breast exams. Your doctor will tell you know how often to have these. You will also need to do breast self-exams as your doctor has told you.  Women should avoid pregnancy while taking this medication. Let your Doctor know right away if you become pregnant.    Schedule a follow-up with your provider as close to your infusion appointment as possible but within 4 -6 months after your infusion.  Contact Neurology Scheduling for assistance at [phone] and listen for the prompts.    If you have an insurance changes, please notify  the office at [phone] as benefits must be verified.     Please reach out to the clinic with any additional questions or concerns. We encourage you to sign up and use MyChart for any non-urgent medical questions as MyChart is the preferred method of communication.  Please contact your pharmacy for medication refills and allow up to 2 business days for routine prescription refills.  For urgent messages, please call the clinic at [phone].  The clinic is open from 8am to 4:30pm Monday through Friday.    You can get [facility] appointment reminders by text - please make sure we have your cell phone number on file at check in/check-out.  Then you can just text [facility] to [phone] to receive appointment messages.

## 2023-08-23 NOTE — Progress Notes (Signed)
Pt arrives for Ocrevus infusion # 8 in usual state of health. Denies recent illness, infection or fever. Ocrevus infused per rapid infusion protocol. Pt tolerated infusion well, and was discharged home stable after 30 min observation. AVS and discharge instructions reviewed..

## 2023-08-24 LAB — LYMPHOCYTE SUBSET (T & B CELLS)
B LYM #(CD19): 0 {cells}/uL — ABNORMAL LOW (ref 120–725)
B LYM %(CD19): 0 % — ABNORMAL LOW (ref 7–36)
CD4#: 789 {cells}/uL (ref 496–2186)
CD4%: 62 % (ref 32–71)
CD4/CD8: 4 — ABNORMAL HIGH (ref 0.7–3.0)
CD8#: 195 {cells}/uL (ref 177–1137)
NK LYM #(CD16+56): 239 {cells}/uL (ref 37–758)
NK LYM %(CD16+56): 19 % (ref 4–26)
NK LYM# (CD3+16+56+): 71 {cells}/uL
NK LYM% (CD3+16+56+): 6 %
T LYM #(CD3): 1004 {cells}/uL (ref 754–2810)
T LYM %(CD3): 78 % (ref 54–87)
T Suppress %(CD8): 15 % (ref 10–38)

## 2023-08-25 ENCOUNTER — Telehealth: Payer: Self-pay | Admitting: Family Medicine

## 2023-08-25 ENCOUNTER — Emergency Department: Payer: Medicare (Managed Care)

## 2023-08-25 ENCOUNTER — Emergency Department
Admission: EM | Admit: 2023-08-25 | Discharge: 2023-08-25 | Disposition: A | Discharge status: Home / Self Care | Payer: Medicare (Managed Care) | Source: Ambulatory Visit | Attending: Student in an Organized Health Care Education/Training Program | Admitting: Student in an Organized Health Care Education/Training Program

## 2023-08-25 ENCOUNTER — Encounter: Payer: Self-pay | Admitting: Medical

## 2023-08-25 ENCOUNTER — Other Ambulatory Visit: Payer: Self-pay

## 2023-08-25 ENCOUNTER — Encounter: Payer: Self-pay | Admitting: Family Medicine

## 2023-08-25 DIAGNOSIS — M791 Myalgia, unspecified site: Secondary | ICD-10-CM

## 2023-08-25 DIAGNOSIS — R200 Anesthesia of skin: Secondary | ICD-10-CM | POA: Insufficient documentation

## 2023-08-25 DIAGNOSIS — G35 Multiple sclerosis: Secondary | ICD-10-CM | POA: Insufficient documentation

## 2023-08-25 DIAGNOSIS — I4510 Unspecified right bundle-branch block: Secondary | ICD-10-CM | POA: Insufficient documentation

## 2023-08-25 DIAGNOSIS — M79604 Pain in right leg: Secondary | ICD-10-CM | POA: Insufficient documentation

## 2023-08-25 DIAGNOSIS — M79661 Pain in right lower leg: Secondary | ICD-10-CM

## 2023-08-25 NOTE — Telephone Encounter (Signed)
Patient calling; states that woke up this morning with a pain behind right calf; is asking to be seen; declined to be scheduled tomorrow with locum; patient would like to be advised if he is not able to be seen with PCP if he needs to go to [facility] or ED; please call to advise.

## 2023-08-25 NOTE — ED Provider Notes (Signed)
History     Chief Complaint   Patient presents with    Leg Pain     [name] is a 72 year old male with a history of asthma, HLD, right bundle branch block, multiple sclerosis, TAA who presents to the ED with right calf pain that he noticed when he woke up this morning.  Wife thought his leg looked red.  Reports chronic numbness in his feet from MS which is unchanged from baseline.  Reports he was traveling in [place] and was on extended flights.  Denies fever, chest pain, shortness of breath.  No history of blood clots.            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      Enlarged prostate with lower urinary tract symptoms (LUTS) 01/26/2017    Environmental allergies 05.25.18    Extrinsic allergic asthma 11/28/2012    Drenda Freeze follows, usually on max therapy in anticipation of bad ragweed season.     HLD (hyperlipidemia)     Hyperlipidemia 05/30/2008    Normal TG, just normal HDL, and LDL as high as 150's at times. No premature CVD in family, mother with CAD late. Started statin in 2014, supplementing with coenzyme Q10 to minimize muscle side effects.     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 See 04/06/16 echo ([facility]) mild.     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            Past Surgical History:   Procedure Laterality Date    COLONOSCOPY      HERNIA REPAIR      HX TONSILLECTOMY/ADENOIDECTOMY      INCISIONAL HERNIA REPAIR      JOINT REPLACEMENT  07/30/2028    Right knee replacement    KNEE ARTHROSCOPY Right     OTHER SURGICAL HISTORY      MS    PR ARTHRP KNE CONDYLE&PLATU MEDIAL&LAT COMPARTMENTS Right 07/31/2019    Procedure: RIGHT TOTAL KNEE ARTHROPLASTY;  Surgeon: Laurance Flatten, MD;  Location: [facility] MAIN OR;  Service: Orthopedics    VASECTOMY            Social History     Tobacco Use    Smoking status: Never    Smokeless tobacco: Never   Substance Use Topics  Alcohol use: Not Currently     Comment: Very occasionally    Drug use: Yes     Types: Marijuana     Comment: occ             Review of Systems   Musculoskeletal:  Positive for myalgias.       Physical Exam     Triage Vitals  Triage Start: Start, (08/25/23 1453)  First Recorded BP: 137/87, Resp: 16, Temp: 36.3 C (97.3 F), Temp src: Infrared Oxygen Therapy SpO2: 97 %, Oximetry Source: Lt Hand, O2 Device: None (Room air), Heart Rate: 59, (08/25/23 1453)  .  First Pain Reported  0-10 Scale: 8, Pain Location/Orientation: Leg Left, (08/25/23 1456)       Physical Exam  Vitals and nursing note reviewed.   Constitutional:       General: He is not in acute distress.     Appearance: Normal appearance. He is not ill-appearing, toxic-appearing or diaphoretic.   Cardiovascular:      Rate and Rhythm: Normal rate and regular rhythm.   Pulmonary:      Effort: Pulmonary effort is normal. No respiratory distress.      Breath sounds: Normal breath sounds. No wheezing, rhonchi or rales.   Abdominal:      General: Bowel sounds are normal. There is no distension.      Palpations: Abdomen is soft.       Tenderness: There is no abdominal tenderness. There is no guarding.   Musculoskeletal:         General: Normal range of motion.      Right lower leg: No edema.      Left lower leg: No edema.      Comments: No color changes of the lower legs.  Mild discomfort to palpation of the right calf.  Sensation is intact in the lower legs to light touch bilaterally.  DP pulses 2+.  Strength is equal with plantar/dorsiflexion bilaterally.  No pitting edema.   Skin:     General: Skin is warm and dry.   Neurological:      Mental Status: He is alert and oriented to person, place, and time.   Psychiatric:         Mood and Affect: Mood normal.         Behavior: Behavior normal.         Medical Decision Making     Assessment:  [name] is a 72 year old male with a history of asthma, HLD, right bundle branch block, multiple sclerosis, TAA who presents to the ED with right calf pain that he noticed when he woke up this morning.  Wife thought his leg looked red.  Reports chronic numbness in his feet from MS which is unchanged from baseline.  Reports he was traveling in [place] and was on extended flights.  Denies injury or trauma.  Denies fever, chest pain, shortness of breath.  No history of blood clots.    On exam patient is well-appearing and in no acute distress.  Right leg with no appreciable swelling, erythema.  Right calf tenderness to palpation.  Neurovascularly intact.      Differential diagnosis:  DVT, muscle spasm, cellulitis    Plan:  Orders Placed This Encounter      US doppler vein RIGHT lower extremity    Medications - No data to display      ED Course and Disposition:  Ultrasound shows no evidence of DVT.  Discussed reassuring workup with patient and wife  at bedside.  Advised conservative measures at home.  Advised PCP follow-up.  Return precautions to the ED given.    US doppler vein RIGHT lower extremity   Final Result        No evidence of deep venous thrombosis in the right lower extremity.        END OF  IMPRESSION.                      [facility] submits this DICOM format image data and final report to the [facility], an independent secure electronic health information exchange, on a reciprocally searchable basis (with patient authorization) for a minimum of 12 months after exam     date.                Joni Reining, PA            Joni Reining, [place]  08/25/23 [phone]

## 2023-08-25 NOTE — ED Triage Notes (Signed)
Pt c/o right calf pain that he noticed when he woke up this AM. Pain worsens when he walks. Pt was on a long flight 17 days ago. States he is feeling more fatigued.     Denies SOB and AC     Prehospital medications given: No

## 2023-08-25 NOTE — Discharge Instructions (Signed)
You were seen in the Emergency Department today for evaluation of leg pain. Based on our evaluation it appears that you are stable for discharge.      Ultrasound is negative for blood clot.  Recommend rest, heat/ice whatever feels better, Tylenol/ibuprofen as needed for pain.    It is important to obtain follow up with your primary care physician whenever you have been evaluated in the Emergency Department. Please call your doctor's office and schedule an appointment.     Return to the Emergency Department if your symptoms persist or worsen - especially if you experience chest pain, trouble breathing.     Please return if you have any concerns and are unable to contact your PCP.     Thank you for allowing me to care for you today. Hope that you feel better soon.    Use acetaminophen (Tylenol) and ibuprofen (Advil) every 4 hours alternating to improve pain and swelling.  No more than 2400 mg of ibuprofen daily or 3000 mg of Tylenol daily due to detrimental renal and liver effects.    For ex. take 1000 mg of acetaminophen at 0800, followed by 400 mg of ibuprofen at 1200 noon, followed by 1000 mg of acetaminophen at 4pm, followed by 400 mg of ibuprofen at 8pm, etc.

## 2023-08-26 NOTE — Telephone Encounter (Signed)
Writer spoke with patient who needs follow up after ED, as he is still having leg pain. Call transferred to staff

## 2023-08-26 NOTE — Telephone Encounter (Signed)
Writer called patient to schedule an appointment for Pain in right calf below knee. Patient was offered the following dates for appointments and patient denied:    08/27/23 at 7:40 am [name])  09/02/23 at 9:00 am (Dr. Jana Hakim)  10/19/23 at 1:40 pm (Dr. Kriste Basque)    Patient stated he has already been seen at the Hospital to rule out a blood clot. He stated he is just going to have to hold off.     Please advise.

## 2023-08-26 NOTE — Telephone Encounter (Signed)
*  late entry*; writer was going to call patient to advise, however, notification sent that patient was seen in ED.

## 2023-08-26 NOTE — Telephone Encounter (Signed)
Writer spoke with patient and he is scheduled with provider 08/30/23 at 4:20 pm

## 2023-08-30 ENCOUNTER — Ambulatory Visit: Payer: Medicare (Managed Care) | Admitting: Family Medicine

## 2023-08-30 ENCOUNTER — Other Ambulatory Visit: Payer: Self-pay

## 2023-08-30 ENCOUNTER — Encounter: Payer: Self-pay | Admitting: Family Medicine

## 2023-08-30 ENCOUNTER — Ambulatory Visit
Admission: RE | Admit: 2023-08-30 | Discharge: 2023-08-30 | Disposition: A | Discharge status: Home / Self Care | Payer: Medicare (Managed Care) | Source: Ambulatory Visit | Attending: Family Medicine | Admitting: Family Medicine

## 2023-08-30 VITALS — BP 123/80 | HR 57 | Temp 96.3°F | Wt 178.4 lb

## 2023-08-30 DIAGNOSIS — Z96651 Presence of right artificial knee joint: Secondary | ICD-10-CM | POA: Insufficient documentation

## 2023-08-30 DIAGNOSIS — M25561 Pain in right knee: Secondary | ICD-10-CM

## 2023-08-30 DIAGNOSIS — K219 Gastro-esophageal reflux disease without esophagitis: Secondary | ICD-10-CM

## 2023-08-30 MED ORDER — OMEPRAZOLE 20 MG PO CPDR *I*
20.0000 mg | DELAYED_RELEASE_CAPSULE | Freq: Every day | ORAL | 0 refills | Status: DC
Start: 2023-08-30 — End: 2023-11-26

## 2023-08-30 NOTE — Progress Notes (Signed)
Subjective:   [name] is a 72 y.o. 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      Enlarged prostate with lower urinary tract symptoms (LUTS) 01/26/2017    Environmental allergies 05.25.18    Extrinsic allergic asthma 11/28/2012    Drenda Freeze follows, usually on max therapy in anticipation of bad ragweed season.     HLD (hyperlipidemia)     Hyperlipidemia 05/30/2008    Normal TG, just normal HDL, and LDL as high as 150's at times. No premature CVD in family, mother with CAD late. Started statin in 2014, supplementing with coenzyme Q10 to minimize muscle side effects.     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 See 04/06/16 echo ([facility]) mild.     Tic disorder         Presented to the office today for:  Chief Complaint   Patient presents with    Follow-up     Right lower leg pain, behind       HPI    Follow-up for hospital visit for right leg pain, recently traveled on an airplane, developed right leg pain afterwards went to the ER for ultrasound to rule out DVT, was negative    No Baker's cyst was identified to seen previously    This is his right knee which he had replaced last year    We will get an x-ray, he has been doing some hiking and had 9000 steps over the weekend, does not have any issues, still has mild tightness around the right knee area particularly in the posterior aspect    On another note his cough is improving and heartburn symptoms are improving with the Prilosec so we  will continue that    No other complaints or concerns      Review of Systems              The patient has a   Family History   Problem Relation Age of Onset    Heart Disease Mother         coronary art disease, not premature    Cancer Father         multiple myeloma    Heart Disease Brother         aneurism-07-09-18    Cancer Maternal Grandmother         unknown    Multiple Sclerosis Neg Hx     Lupus Neg Hx     Rheum arthritis Neg Hx     Thyroid disease Neg Hx     Diabetes Neg Hx     Colon cancer Neg Hx     Colon polyps Neg Hx     High Blood Pressure Neg Hx     Rectal cancer Neg Hx        Objective:   BP  123/80 (BP Location: Left arm, Patient Position: Sitting)   Pulse 57   Temp 35.7 C (96.3 F)   Wt 80.9 kg (178 lb 6.4 oz)   SpO2 99%   BMI 24.88 kg/m    BP Readings from Last 3 Encounters:   08/30/23 123/80   08/25/23 133/81   08/23/23 117/63       Physical Exam    Lab Results   Component Value Date    WBC 5.2 08/23/2023    HGB 14.2 08/23/2023    HCT 43 08/23/2023    PLT 176 08/23/2023    CHOL 107 03/01/2023    TRIG 54 03/01/2023    HDL 58 03/01/2023    ALT 25 08/23/2023    AST 29 08/23/2023    NA 139 08/23/2023    K 4.3 08/23/2023    CL 102 08/23/2023    CO2 27 08/23/2023    TSH 3.91 05/19/2019    INR 1.1 07/10/2019     No components found for: "CALCIUM", "PHOS"  No components found for: "LDLCALC", "LDLCHOLESTEROL", "LDLDIRECT"    Assessment and Plan:     1. Acute pain of right knee (Primary)  X-ray to rule out any joint issue  - * Knee RIGHT standard AP, Lateral, Patellar views; Future    2. Gastroesophageal reflux disease without esophagitis  - omeprazole (PRILOSEC) 20 mg capsule; Take 1 capsule (20 mg total) by mouth daily (before breakfast).  Dispense: 90 capsule; Refill: 0            Requested Prescriptions     Signed Prescriptions Disp Refills    omeprazole (PRILOSEC) 20 mg capsule 90 capsule 0     Sig: Take 1 capsule (20 mg total) by mouth daily (before breakfast).       Medications Discontinued During  This Encounter   Medication Reason    guaiFENesin-codeine (GUAITUSS AC) 100-10 MG/5ML liquid Med List Cleanup (does not show as 'Stop Taking' on AVS)    omeprazole (PRILOSEC) 20 mg capsule Reorder       [name] received counseling on the following healthy behaviors: nutrition, exercise and medication adherence    Discussed use,benefit, and side effects of prescribed medications.  Barriers to medication compliance addressed.      All patient questions answered.  Pt voiced understanding.     Follow up in about 6 months (around 02/27/2024) for Follow-up, Yearly Check-up.        Disclaimer: Some orall of this note was transcribed using voice-recognition software.This may cause typographical errors occasionally. Although all effort is made to fix these errors, please do not hesitate to contact our office if there isany concern with the understanding of this note.

## 2023-09-01 ENCOUNTER — Ambulatory Visit: Payer: Medicare (Managed Care) | Attending: Neurology | Admitting: Neurology

## 2023-09-01 ENCOUNTER — Other Ambulatory Visit: Payer: Self-pay

## 2023-09-01 VITALS — BP 116/77 | HR 73 | Temp 97.9°F | Wt 172.0 lb

## 2023-09-01 DIAGNOSIS — G35 Multiple sclerosis: Secondary | ICD-10-CM | POA: Insufficient documentation

## 2023-09-01 NOTE — Patient Instructions (Addendum)
MRI brain and thoracic spine in May 2025  Get flu and RSV vaccine    Please reach out to the clinic with any additional questions or concerns. We encourage you to sign up and use MyChart for any non-urgent medical questions as MyChart is the preferred method of communication.  Please contact your pharmacy for medication refills and allow up to 2 business days for routine prescription refills.  For urgent messages, please call the clinic at [phone].  The clinic is open from 8am to 4:30pm Monday through Friday.    You can get [facility] appointment reminders by text - please make sure we have your cell phone number on file at check in/check-out.  Then you can just text [facility] to [phone] to receive appointment messages.

## 2023-09-01 NOTE — Progress Notes (Addendum)
Multiple Sclerosis Clinic Follow-up Visit    Subjective:  [name] is a 72 y.o. 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     He was last seen by Dr Ardelia Mems on 02/24/2023, since then he has no new symptoms.    Ocrevus infusions are going well, no side effects except for feeling tired on the day of the infusion. Last infusion was on 08/23/2023.    He started seeing a trainer at the gym who has given him exercises to do, he is trying to exercise 3 times a week.    He went on a trip to [place] where he walked for long distances on cobblestone roads, he used walking sticks to help. At the end of a long walk, he feels his legs get more tired, his gait worsens and he needs to concentrate not to trip. He also went on a hike recently where he walked for a long distance without any major issue, he just takes more time.  He does not use the walking sticks on regular days only on longer walks.    He previously tried different types of foot braces but they were not helpful.    He is still taking the Ampyra.     He was playing golf until the weather got cold. He will be swimming when he moves to [place] for the winter.    The weakness is mor distal, some proximal , can go up and down stairs    He feels his memory is good and his mood is good most of the time.    No urinary retention, still has some dribbling, following up with urology, tried something that did not work and decided it not worth treating.    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 He works from home  and he cut back on hours.    His sleep is good, he can fall asleep easily, sometimes wakes up to go to the bathroom and has difficulty falling back asleep.    He still has continuous numbness and tingling in the toes, but he can feel the ground under his feet when he is walking, the tingling is not painful, he tried gabapentin and lyrica in the past, they did help a lot and did not like how they made him  feel.    He had COVID in August but otherwise no recurrent infections. He is planning to get the vaccine. He took the flu shot.    MS History:    Clinical presentations:  1990's R arm weakness  Later numbness hands and feet and stereotyped abnormal distal R thigh sensation    MRIs:  4/08 brain-relatively mild lesion burden, 1 enhancing lesion at the time  5/13 brain-no new lesions  11/14 brain-unchanged  10/16 brain-unchanged  4/18 brain-unchanged                 Thoracic-few, non-enhancing intramedullary lesions  12/20 brain-unchanged                  Thoracic cord- at least 3 new intramedullary lesions (no contrast given)  6/21 brain and thoracic cord-unchanged  10/22: brain unchanged  9/23 brain-stable. Cervical-intramedullary lesions C2, C3, C4, C5, C7.    Other testing:  12/20 JC index 0.33, indeterminate to negative  6/21 natalizumab antibody +  5/24 B cells 22 (9 month interval between Ocrevus infusions)    Disease modifying treatment  hx:  Betaseron started in 1990's, stopped due to injection site reactions/infections  Tecfidera started 5/13, stopped due to new thoracic cord lesions  1/21 Tysabri started, stopped due to +natalizumab antibody  Ocrevus started 7/21 and continued through the present    Symptomatic treatment hx:  ED-Cialis helps  Foot cramps-baclofen can be helpful  Stereotyped R thigh abnormal sensation-less bothersome, not certain if Tegretol helped  Uncomfortable numbness feet-Lyrica not helpful and caused side effects. Gabapentin caused him to feel loopy  Knee pain-acupuncture  Gait-Ampyra, gait worsened when it was stopped. PT less helpful in the past. 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 Lives with his wife. Has 3 children. Never smoker. [place] in [place]. Enjoys golf and swimming.     Meds:  Current Outpatient Medications on File Prior to Visit   Medication Sig Dispense Refill    omeprazole (PRILOSEC) 20 mg capsule Take 1 capsule (20 mg total) by mouth daily (before breakfast). 90 capsule 0    zolpidem (AMBIEN CR) 12.5 mg CR tablet Take 1 tablet (12.5 mg total) by mouth nightly as needed for Sleep. Max daily dose: 12.5 mg Swallow whole. Do not crush, break, or chew. 30 tablet 0    tadalafil (CIALIS) 5 MG tablet TAKE 1 TABLET (5 MG TOTAL) BY MOUTH DAILY. 90 tablet 0    dalfampridine (AMPYRA) 10 MG tablet Take 1 tablet (10 mg total) by mouth 2 times daily. 60 tablet 5    Calcium Citrate-Vitamin D (CALCIUM CITRATE +D PO) Take 1 capsule by mouth daily.      atorvastatin (LIPITOR) 40 mg tablet Take 1 tablet (40 mg total) by mouth nightly 90 tablet 3    ocrelizumab (OCREVUS) 300 MG/10ML injection Administer into the vein every 6 months      ascorbic acid (VITAMIN C) 100 MG tablet Take 1 tablet (100 mg total) by mouth daily.      Cyanocobalamin (VITAMIN B-12 CR PO) Take 1,000 mcg by mouth daily          No current facility-administered medications on file prior to visit.        Vitals  There were no vitals taken for this visit.    MENTAL STATUS: awake and alert; Appropriately oriented. language intact to casual conversation. affect was appropriate to situation    CRANIAL NERVES:    II: acuity was: OD20/25/OS20/25; discs sharp; pupils 3/3 to 2/2 without APD; fields intact to confrontation; no red desaturation noted    III/IV/VI: pursuits a little choppy, versions intact without nystagmus    V: facial sensation symmetric to light  touch    VII: facial expression symmetric    VIII: hearing intact to voice  IX/X: palate elevates symmetrically    XI: shoulder shrug symmetric    XII: tongue midline    MOTOR:  - increased tone in LE  Upper Extremity Strength  (R/L)   Shoulder abduction 4/4+   Elbow flexion 5/5   Elbow extension 5/5   Wrist flexion 5/5   Wrist extension 5/5     Lower Extremity Strength  (R/L)   Hip flexion 4/4   Knee extension 5/5   Knee flexion 5/5   Ankle plantarflexion 5/5   Ankle dorsiflexion 4+/4+     - no pronator drift; no abnormal movements    SENSATION:  - light touch: decreased 50% at both feet and improves when going more proximally  - vibration (R/L, seconds): absent in both LE up until the knees  - No sensory ataxia on finger to nose with eyes closed  - Joint position sense intact at both great toes  - Romberg was absent with swaying    COORDINATION:  - finger to nose normal, no ataxia on exam  - finger tapping was rapid and accurate bilaterally    REFLEXES:  Reflex Right Left   BR 2+ 2+   Biceps 2+ 2+   Patellae 2+ 2+   Achilles 0 0   Toes down down   Spreading in both UE    GAIT:  - wide base and spastic with some steppage; wobbly heel-raised and toe-raised gait, cannot tandem gait    QUANTITATIVE SCORES:  Timed 25-foot walk (sec): 5.5.  Assistive device: none    May 2024: 4.91 sec without assist device.     IMAGING:    Last MRI brain and cervical spine on 06/23/2022 was stable    LABS:    08/23/2023:  CBC WNL  CMP WNL  Lymphocyte subsets B cells 0  Ig A/G/M: WNL    Assesment and Plan:  72 y.o. M with progressive MS with activity with some difficulty with gait/leg strength. He uses walking sticks when going on long distances. Tolerates Ocrevus, so will continue this to prevent activity, although will not fully address the progressive aspect of the MS. Symptomatically will continue with Ampyra and regular exercise.     He will get the flu shot soon and we discussed RSV vaccine too.    DMT  - continue Ocrevus q6 mo  -  get flu and RSV vaccine    Monitoring  - Labs: CBC, CMP, lymphocyte subsets, immunoglobulins q42mo with infusions  - Imaging: every 1.5 year MRIs, next 5/25 brain and thoracic w/o, wide bore scanner    Symptomatic treatment  - Ongoing exercise and use of Ampyra for symptomatic tx    Follow up   - In 6 months    Case seen and discussed with Dr Wonda Horner, MD  Neuroimmunology Fellow    Attending Addendum:    I discussed and examined this patient with Dr Vilma Prader.  I agree with the history, findings, assessment and plan.    Delvina Mizzell, MD  Neuroimmunology

## 2023-09-24 ENCOUNTER — Other Ambulatory Visit: Payer: Self-pay | Admitting: Neurology

## 2023-09-24 DIAGNOSIS — G35 Multiple sclerosis: Secondary | ICD-10-CM

## 2023-09-28 ENCOUNTER — Other Ambulatory Visit: Payer: Self-pay | Admitting: Primary Care

## 2023-09-28 NOTE — Telephone Encounter (Signed)
Last office visit:   08/30/2023  Patients upcoming appointments:  Future Appointments   Date Time Provider Department Center   02/21/2024  8:00 AM [facility] None   02/22/2024  9:20 AM Kriste Basque, Gaye Pollack, MD [facility] None   03/01/2024  8:30 AM Gaetana Michaelis, MD [facility] None     Recent Lab results:  GENERAL CHEMISTRY   Recent Labs     08/23/23  1348 02/18/23  1315   NA 139 140   K 4.3 4.3   CL 102 102   CO2 27 25   GAP 10 13   UN 13 17   CREAT 1.08 1.13   GLU 75 131*   CA 9.0 9.3      LIPID PROFILE   Recent Labs     03/01/23  1146   CHOL 107   TRIG 54   HDL 58   LDLC 38      LIVER PROFILE   Recent Labs     08/23/23  1348 02/18/23  1315   ALT 25 35   AST 29 38   ALK 90 81   TB 0.8 1.0      DIABETES THYROID   Recent Labs     03/01/23  1146   HA1C 5.1    No value within the past 365 days      Pending/Orders Labs:  Lab Frequency Next Occurrence   DEXA Scan Once 02/22/2023   MR head without contrast Once 02/11/2024   MR spine thoracic without contrast Once 02/11/2024   MR head without contrast Once 02/14/2024   MR spine thoracic without contrast Once 02/14/2024

## 2023-10-15 ENCOUNTER — Other Ambulatory Visit: Payer: Self-pay | Admitting: Family Medicine

## 2023-10-15 NOTE — Telephone Encounter (Signed)
 Last office visit:   08/30/2023  Patients upcoming appointments:  Future Appointments   Date Time Provider Department Center   02/21/2024  8:00 AM [facility] None   02/22/2024  9:20 AM Kriste Basque, Gaye Pollack, MD [facility] None   03/01/2024  8:30 AM Gaetana Michaelis, MD [facility] None     Recent Lab results:  GENERAL CHEMISTRY   Recent Labs     08/23/23  1348 02/18/23  1315   NA 139 140   K 4.3 4.3   CL 102 102   CO2 27 25   GAP 10 13   UN 13 17   CREAT 1.08 1.13   GLU 75 131*   CA 9.0 9.3      LIPID PROFILE   Recent Labs     03/01/23  1146   CHOL 107   TRIG 54   HDL 58   LDLC 38      LIVER PROFILE   Recent Labs     08/23/23  1348 02/18/23  1315   ALT 25 35   AST 29 38   ALK 90 81   TB 0.8 1.0      DIABETES THYROID   Recent Labs     03/01/23  1146   HA1C 5.1    No value within the past 365 days      Pending/Orders Labs:  Lab Frequency Next Occurrence   DEXA Scan Once 02/22/2023   MR head without contrast Once 02/11/2024   MR spine thoracic without contrast Once 02/11/2024   MR head without contrast Once 02/14/2024   MR spine thoracic without contrast Once 02/14/2024

## 2023-11-18 ENCOUNTER — Encounter: Payer: Self-pay | Admitting: Neurology

## 2023-11-26 ENCOUNTER — Other Ambulatory Visit: Payer: Self-pay | Admitting: Family Medicine

## 2023-11-26 DIAGNOSIS — K219 Gastro-esophageal reflux disease without esophagitis: Secondary | ICD-10-CM

## 2023-11-26 MED ORDER — OMEPRAZOLE 20 MG PO CPDR *I*
20.0000 mg | DELAYED_RELEASE_CAPSULE | Freq: Every day | ORAL | 0 refills | Status: DC
Start: 2023-11-26 — End: 2024-03-02

## 2023-11-26 NOTE — Telephone Encounter (Signed)
Last office visit:   08/30/2023  Patients upcoming appointments:  Future Appointments   Date Time Provider Department Center   02/14/2024 12:50 PM RIS, CCD U MR4 (MR4) [facility]   02/14/2024  1:35 PM RIS, CCD U MR4 (MR4) [facility]   02/21/2024  8:00 AM [facility] None   02/22/2024  9:20 AM Rahmatullah, Gaye Pollack, MD [facility] None   03/01/2024  8:30 AM Gaetana Michaelis, MD [facility] None     Recent Lab results:  GENERAL CHEMISTRY   Recent Labs     08/23/23  1348 02/18/23  1315   NA 139 140   K 4.3 4.3   CL 102 102   CO2 27 25   GAP 10 13   UN 13 17   CREAT 1.08 1.13   GLU 75 131*   CA 9.0 9.3      LIPID PROFILE   Recent Labs     03/01/23  1146   CHOL 107   TRIG 54   HDL 58   LDLC 38      LIVER PROFILE   Recent Labs     08/23/23  1348 02/18/23  1315   ALT 25 35   AST 29 38   ALK 90 81   TB 0.8 1.0      DIABETES THYROID   Recent Labs     03/01/23  1146   HA1C 5.1    No value within the past 365 days      Pending/Orders Labs:  Lab Frequency Next Occurrence   DEXA Scan Once 02/22/2023   MR head without contrast Once 02/11/2024   MR spine thoracic without contrast Once 02/11/2024   MR head without contrast Once 02/14/2024   MR spine thoracic without contrast Once 02/14/2024

## 2024-01-16 ENCOUNTER — Other Ambulatory Visit: Payer: Self-pay | Admitting: Primary Care

## 2024-01-17 MED ORDER — TADALAFIL 5 MG PO TABS *I*
5.0000 mg | ORAL_TABLET | Freq: Every day | ORAL | 1 refills | Status: DC
Start: 2024-01-17 — End: 2024-04-10

## 2024-01-17 NOTE — Telephone Encounter (Signed)
 Tadalafil 5MG  tablets were approved through 4.7.26

## 2024-01-17 NOTE — Telephone Encounter (Signed)
 PA request for Tadalafil 5mg  tab     Key # [id number]    Thank you!

## 2024-01-17 NOTE — Telephone Encounter (Signed)
 Last office visit:   08/30/2023  Patients upcoming appointments:  Future Appointments   Date Time Provider Department Center   02/14/2024 12:50 PM RIS, CCD U MR4 (MR4) [facility]   02/14/2024  1:35 PM RIS, CCD U MR4 (MR4) [facility]   02/21/2024  8:00 AM [facility] None   02/22/2024  9:20 AM Rahmatullah, Gaye Pollack, MD [facility] None   03/01/2024  8:30 AM Gaetana Michaelis, MD [facility] None     Recent Lab results:  GENERAL CHEMISTRY   Recent Labs     08/23/23  1348 02/18/23  1315   NA 139 140   K 4.3 4.3   CL 102 102   CO2 27 25   GAP 10 13   UN 13 17   CREAT 1.08 1.13   GLU 75 131*   CA 9.0 9.3      LIPID PROFILE   Recent Labs     03/01/23  1146   CHOL 107   TRIG 54   HDL 58   LDLC 38      LIVER PROFILE   Recent Labs     08/23/23  1348 02/18/23  1315   ALT 25 35   AST 29 38   ALK 90 81   TB 0.8 1.0      DIABETES THYROID   Recent Labs     03/01/23  1146   HA1C 5.1    No value within the past 365 days      Pending/Orders Labs:  Lab Frequency Next Occurrence   DEXA Scan Once 02/22/2023   MR head without contrast Once 02/11/2024   MR spine thoracic without contrast Once 02/11/2024   MR head without contrast Once 02/14/2024   MR spine thoracic without contrast Once 02/14/2024

## 2024-02-07 ENCOUNTER — Other Ambulatory Visit: Payer: Self-pay

## 2024-02-07 NOTE — Progress Notes (Unsigned)
Need order for ocrevus

## 2024-02-08 ENCOUNTER — Encounter: Payer: Self-pay | Admitting: Neurology

## 2024-02-08 ENCOUNTER — Other Ambulatory Visit: Payer: Self-pay | Admitting: Adult Health

## 2024-02-08 DIAGNOSIS — G35 Multiple sclerosis: Secondary | ICD-10-CM

## 2024-02-08 MED ORDER — DIAZEPAM 5 MG PO TABS *I*
ORAL_TABLET | ORAL | 0 refills | Status: AC
Start: 2024-02-08 — End: ?

## 2024-02-08 NOTE — Telephone Encounter (Signed)
 See patient's MyChart message. Pended new prescription for valium  and will forward to NP pool to sign and send. Patient's MRI is scheduled on 02/14/24.    Per ISTOP, last dispensed: nothing in ISTOP for valium      Date of last visit and plan for FUV: 09/01/23  Date of FUV? 03/01/24  Date of last MRI and plan for next: 07/01/22  Is MRI ordered? Scheduled 02/14/24  DMT (including alternative dosing if applicable): ocrevus     Labs:      Lab results: 08/23/23  1348   Sodium 139   Potassium 4.3   Chloride 102   CO2 27   UN 13   Creatinine 1.08   Glucose 75   Calcium  9.0           Lab results: 08/23/23  1348   Total Protein 5.9*   Albumin 4.0   ALT 25   AST 29   Alk Phos 90   Bilirubin,Total 0.8           Lab results: 08/23/23  1348   WBC 5.2   Hemoglobin 14.2   Hematocrit 43   RBC 4.9   Platelets 176   Neut # K/uL 3.1   Lymph # K/uL 1.3   Mono # K/uL 0.7   Eos # K/uL 0.1   Baso # K/uL 0.0   Seg Neut % 58.7

## 2024-02-13 ENCOUNTER — Other Ambulatory Visit: Payer: Self-pay

## 2024-02-14 ENCOUNTER — Ambulatory Visit
Admission: RE | Admit: 2024-02-14 | Discharge: 2024-02-14 | Disposition: A | Discharge status: Home / Self Care | Payer: Medicare (Managed Care) | Source: Ambulatory Visit | Attending: Neurology | Admitting: Neurology

## 2024-02-14 ENCOUNTER — Ambulatory Visit
Admission: RE | Admit: 2024-02-14 | Discharge: 2024-02-14 | Disposition: A | Discharge status: Home / Self Care | Payer: Medicare (Managed Care) | Source: Ambulatory Visit

## 2024-02-14 DIAGNOSIS — M47814 Spondylosis without myelopathy or radiculopathy, thoracic region: Secondary | ICD-10-CM

## 2024-02-14 DIAGNOSIS — G35 Multiple sclerosis: Secondary | ICD-10-CM | POA: Insufficient documentation

## 2024-02-14 DIAGNOSIS — M5134 Other intervertebral disc degeneration, thoracic region: Secondary | ICD-10-CM | POA: Insufficient documentation

## 2024-02-17 ENCOUNTER — Encounter: Payer: Self-pay | Admitting: Adult Health

## 2024-02-18 ENCOUNTER — Telehealth: Payer: Self-pay

## 2024-02-18 NOTE — Telephone Encounter (Signed)
 [name] is still interested and comfortable coming in for treatment.  I completed the COVID-19 screen, patient confirmed that they have not had any exposure and does not have any symptoms.  I explained that we are taking extra precautions and using some social distancing practices and patient knows to call if they start to develop symptoms or learns that they have come in contact with a confirmed case.    Do you feel like you could have an infection or are you taking antibiotics for an infection? No    Confirmed with patient that they have had no insurance changes     01/26/2023   AMB [facility] PRIOR AUTH    Prior Authorization Needed No    Treatment/Medication Name Ocrevus  600mg  every 6 months buy and bill    Date of Service    Authorization Type Medication    CPT Code J2350    Diagnosis Code MS G35    Physician Name and NPI Camilo Cella robb [id number]    Service Location [facility]    Prior Authorization Status Authorization not required    Request Date 01/26/2023    Authorization Number    Authorization Start Date    Authorization End Date    Insurance Company Name Medicare [facility] Choice    Policy Number [id number] (new id)    Comments Per submission on CAP no pa needed

## 2024-02-20 ENCOUNTER — Other Ambulatory Visit: Payer: Self-pay

## 2024-02-21 ENCOUNTER — Ambulatory Visit: Payer: Medicare (Managed Care)

## 2024-02-21 ENCOUNTER — Ambulatory Visit: Payer: Medicare (Managed Care) | Attending: Adult Health

## 2024-02-21 VITALS — BP 133/87 | HR 62 | Temp 97.7°F | Resp 16

## 2024-02-21 DIAGNOSIS — G35 Multiple sclerosis: Secondary | ICD-10-CM | POA: Insufficient documentation

## 2024-02-21 LAB — CBC AND DIFFERENTIAL
Baso # K/uL: 0 10*3/uL (ref 0.0–0.2)
Eos # K/uL: 0.1 10*3/uL (ref 0.0–0.5)
Hematocrit: 43 % (ref 37–52)
Hemoglobin: 14.1 g/dL (ref 12.0–17.0)
IMM Granulocytes #: 0 10*3/uL (ref 0.0–0.0)
IMM Granulocytes: 0.6 %
Lymph # K/uL: 1.1 10*3/uL (ref 1.0–5.0)
MCV: 88 fL (ref 75–100)
Mono # K/uL: 0.6 10*3/uL (ref 0.1–1.0)
Neut # K/uL: 3.4 10*3/uL (ref 1.5–6.5)
Nucl RBC # K/uL: 0 10*3/uL (ref 0.0–0.0)
Nucl RBC %: 0 /100{WBCs} (ref 0.0–0.2)
Platelets: 189 10*3/uL (ref 150–450)
RBC: 4.9 MIL/uL (ref 4.0–6.0)
RDW: 12.5 % (ref 0.0–15.0)
Seg Neut %: 63.5 %
WBC: 5.4 10*3/uL (ref 3.5–11.0)

## 2024-02-21 LAB — COMPREHENSIVE METABOLIC PANEL
ALT: 81 U/L — ABNORMAL HIGH (ref 0–50)
AST: 63 U/L — ABNORMAL HIGH (ref 0–50)
Albumin: 4.1 g/dL (ref 3.5–5.2)
Alk Phos: 87 U/L (ref 40–130)
Anion Gap: 12 (ref 7–16)
Bilirubin,Total: 0.9 mg/dL (ref 0.0–1.2)
CO2: 24 mmol/L (ref 20–28)
Calcium: 9.1 mg/dL (ref 8.6–10.2)
Chloride: 103 mmol/L (ref 96–108)
Creatinine: 1.07 mg/dL (ref 0.67–1.17)
Glucose: 69 mg/dL (ref 60–99)
Lab: 18 mg/dL (ref 6–20)
Potassium: 4 mmol/L (ref 3.3–5.1)
Sodium: 139 mmol/L (ref 133–145)
Total Protein: 6 g/dL — ABNORMAL LOW (ref 6.3–7.7)
eGFR BY CREAT: 73 *

## 2024-02-21 LAB — IGM: IgM: 160 mg/dL (ref 40–230)

## 2024-02-21 LAB — IGG: IgG: 724 mg/dL (ref 700–1600)

## 2024-02-21 LAB — IGA: IgA: 137 mg/dL (ref 70–400)

## 2024-02-21 MED ORDER — METHYLPREDNISOLONE SOD SUCC 125 MG IJ SOLR(62.5MG/ML) *WRAPPED*
125.0000 mg | Freq: Once | INTRAMUSCULAR | Status: AC
Start: 2024-02-21 — End: 2024-02-21
  Administered 2024-02-21: 125 mg via INTRAVENOUS

## 2024-02-21 MED ORDER — ACETAMINOPHEN 325 MG PO TABS *I*
975.0000 mg | ORAL_TABLET | Freq: Once | ORAL | Status: AC
Start: 2024-02-21 — End: 2024-02-21
  Administered 2024-02-21: 975 mg via ORAL

## 2024-02-21 MED ORDER — SODIUM CHLORIDE 0.9 % 500 ML IV SOLN NON-PVC *I*
600.0000 mg | Freq: Once | INTRAVENOUS | Status: AC
Start: 2024-02-21 — End: 2024-02-21
  Administered 2024-02-21: 600 mg via INTRAVENOUS
  Filled 2024-02-21: qty 20

## 2024-02-21 MED ORDER — DIPHENHYDRAMINE HCL 25 MG ORAL SOLID *WRAPPED*
25.0000 mg | Freq: Once | ORAL | Status: AC
Start: 2024-02-21 — End: 2024-02-21
  Administered 2024-02-21: 25 mg via ORAL

## 2024-02-21 MED ORDER — CETIRIZINE HCL 10 MG PO TABS *I*
10.0000 mg | ORAL_TABLET | Freq: Once | ORAL | Status: AC
Start: 2024-02-21 — End: 2024-02-21
  Administered 2024-02-21: 10 mg via ORAL

## 2024-02-21 NOTE — Progress Notes (Signed)
 Pt arrives for Ocrevus infusion # 9 in usual state of health . Denies recent illness, infection or fever.  Pre-medicated as per MAR.   Ocrevus infused per rapid protocol. Pt tolerated infusion well, and was discharged home stable. AVS and discharge instructions given and reviewed.

## 2024-02-21 NOTE — Patient Instructions (Signed)
 Continue with your usual medications as prescribed.    Ocrevus can increase your risk of respiratory, skin, and herpes infections, please call [phone] with any of the following:    Fevers/chills  Cough that will not go away  Signs and symptoms of respiratory infections  Cold sores/shingles/genital sores  Itching/rashes/hives     Side effects can occur up to a week after infusion.  If you experience body aches or low grade fever you can take Tylenol or Ibuprofen as prescribed. If you experience a rash or itching you can take Benadryl or any other allergy medicine you may have as prescribed. Please call the patient line at [phone] if symptoms don't go away.    You may have more chance of getting an infection. Wash hands often. Stay away from people with infections, colds, or flu. Continue to wear your mask out in public. It is highly recommended that you get a COVID-19 vaccine and booster. People who are fully vaccinated on this therapy have gotten COVID-19. If you think you may have COVID-19 get tested right away. If you are positive you may qualify for medications to lessen the severity of COVID-19. There is a limited time frame these medications can be administered so please get tested as soon as possible and let your neurologist know of a positive test result.    You should not receive any live vaccines while on Ocrevus.  Please check with your MD first. Be sure to have regular breast exams. Your doctor will tell you know how often to have these. You will also need to do breast self-exams as your doctor has told you.  Women should avoid pregnancy while taking this medication. Let your Doctor know right away if you become pregnant.    Schedule a follow-up with your provider as close to your infusion appointment as possible but within 4 -6 months after your infusion.  Contact Neurology Scheduling for assistance at [phone] and listen for the prompts.    If you have an insurance changes, please notify  the office at [phone] as benefits must be verified.     Please reach out to the clinic with any additional questions or concerns. We encourage you to sign up and use MyChart for any non-urgent medical questions as MyChart is the preferred method of communication.  Please contact your pharmacy for medication refills and allow up to 2 business days for routine prescription refills.  For urgent messages, please call the clinic at [phone].  The clinic is open from 8am to 4:30pm Monday through Friday.    You can get [facility] appointment reminders by text - please make sure we have your cell phone number on file at check in/check-out.  Then you can just text [facility] to [phone] to receive appointment messages.

## 2024-02-22 ENCOUNTER — Ambulatory Visit: Payer: Medicare (Managed Care) | Admitting: Family Medicine

## 2024-02-22 ENCOUNTER — Encounter: Payer: Self-pay | Admitting: Family Medicine

## 2024-02-22 ENCOUNTER — Other Ambulatory Visit: Payer: Self-pay

## 2024-02-22 VITALS — BP 112/78 | HR 72 | Temp 97.9°F | Ht 71.0 in | Wt 173.0 lb

## 2024-02-22 DIAGNOSIS — L989 Disorder of the skin and subcutaneous tissue, unspecified: Secondary | ICD-10-CM

## 2024-02-22 DIAGNOSIS — Z0000 Encounter for general adult medical examination without abnormal findings: Secondary | ICD-10-CM

## 2024-02-22 DIAGNOSIS — E785 Hyperlipidemia, unspecified: Secondary | ICD-10-CM

## 2024-02-22 LAB — LIPID PANEL
Chol/HDL Ratio: 2.1
Cholesterol: 130 mg/dL
HDL: 61 mg/dL — ABNORMAL HIGH (ref 40–60)
LDL Calculated: 57 mg/dL
Non HDL Cholesterol: 69 mg/dL
Triglycerides: 52 mg/dL

## 2024-02-22 LAB — LYMPHOCYTE SUBSET (T & B CELLS)
B LYM #(CD19): 0 {cells}/uL — ABNORMAL LOW (ref 120–725)
B LYM %(CD19): 0 % — ABNORMAL LOW (ref 7–36)
CD4#: 621 {cells}/uL (ref 496–2186)
CD4%: 64 % (ref 32–71)
CD4/CD8: 4.9 — ABNORMAL HIGH (ref 0.7–3.0)
CD8#: 126 {cells}/uL — ABNORMAL LOW (ref 177–1137)
NK LYM #(CD16+56): 202 {cells}/uL (ref 37–758)
NK LYM %(CD16+56): 21 % (ref 4–26)
NK LYM# (CD3+16+56+): 33 {cells}/uL
NK LYM% (CD3+16+56+): 3 %
T LYM #(CD3): 747 {cells}/uL — ABNORMAL LOW (ref 754–2810)
T LYM %(CD3): 78 % (ref 54–87)
T Suppress %(CD8): 13 % (ref 10–38)

## 2024-02-22 NOTE — Patient Instructions (Addendum)
 [facility]:  Dr Audie Leander, MD  [phone]                     Thank you for completing your Subsequent Annual Medicare Visit and Annual Exam   with us  today.     The purpose of this visits was to:    Screen for disease  Assess risk of future medical problems  Help develop a healthy lifestyle  Update vaccines  Get to know your doctor in case of an illness    Patient Care Team:  Esther Hem, MD as PCP - General (Primary Care)  Jossie Nine, MD as Provider Team (Neurology)  Deloras Fess Jaclynn Mast, MD as Provider Team (Cardiology)  Rojean Cleaves, MD as Provider Team (Ophthalmology)  Marlise Simpers, MD as Provider Team (Dermatology)  Cecilio Coffer, MD as Provider Team (Allergy/Immunology/Rheumatology)     Medicare 5 Year Plan    The following items were identified as areas of concern during your screening today:  None identified - This is great news. You have avoided Smoking, Diabetes, High Blood Pressure (Hypertension) and a high BMI (you are not overweight).     The Health Maintenance table below identifies screening tests and immunizations recommended by your health care team:  Health Maintenance: These screening recommendations are based on USPSTF, [employer], and [place] state guidelines   Topic Date Due    HIV Screening  01/24/2039 (Originally 02/27/1964)    COVID-19 Vaccine (7 - Moderna risk 2024-25 season) 03/18/2024    Depression - Yearly  02/21/2025    Fall Risk Screening  02/21/2025    DTaP/Tdap/Td Vaccines (4 - Td or Tdap) 12/20/2031    Colon Cancer Screening  04/15/2033    Flu Shot  Completed    Shingles Vaccine  Completed    Hepatitis C Screening  Completed    Pneumococcal Vaccination  Completed    Hepatitis B Vaccine  Aged Out    HIB Vaccine  Aged Out    HPV Vaccine  Aged Out    Meningococcal Vaccine  Aged Out    Rotavirus Vaccine  Aged Out    Meningitis Vaccine  Aged Out     In addition, goals and orders placed to address these recommendations are listed in  the "Today's Visit" section.    We wish you the best of health and look forward to seeing you again next year for your Annual Medicare Wellness Visit.     If you have any health care concerns before then, please do not hesitate to contact us .

## 2024-02-22 NOTE — Progress Notes (Signed)
 CC: Complete Physical    HPI:    I had the pleasure of seeing [name] 73 y.o. male in the office today for a complete physical.      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      Enlarged prostate with lower urinary tract symptoms (LUTS) 01/26/2017    Environmental allergies 05.25.18    Extrinsic allergic asthma 11/28/2012    Blanca Bunch follows, usually on max therapy in anticipation of bad ragweed season.     HLD (hyperlipidemia)     Hyperlipidemia 05/30/2008    Normal TG, just normal HDL, and LDL as high as 150's at times. No premature CVD in family, mother with CAD late. Started statin in 2014, supplementing with coenzyme Q10 to minimize muscle side effects.     Incomplete Right Bundle Branch Block 05/30/2008          Multiple sclerosis     Osteoarthritis of right knee 02/01/2014    Psoriasis     S/P R TKA 07/31/2019 07/31/2019    Thoracic aortic aneurysm (TAA) 02/22/2009    mild- 4.0 cm  by CT scan 5/10 See 6/13 echo at [facility] describing no change, plan for 24 month f/u See [id number] [facility] note. See 04/06/16 echo ([facility]) mild.     Tic disorder        Past Surgical History:   Procedure Laterality Date    COLONOSCOPY      HERNIA REPAIR      HX TONSILLECTOMY/ADENOIDECTOMY      INCISIONAL HERNIA REPAIR      JOINT REPLACEMENT  07/30/2028    Right knee replacement    KNEE ARTHROSCOPY Right     OTHER SURGICAL HISTORY      MS    PR ARTHRP KNE CONDYLE&PLATU MEDIAL&LAT COMPARTMENTS Right 07/31/2019    Procedure: RIGHT TOTAL KNEE ARTHROPLASTY;  Surgeon: Marget Sheffield, MD;  Location: [facility] MAIN OR;  Service: Orthopedics    VASECTOMY         Allergies[1]    Current Outpatient Medications   Medication    tadalafil  (CIALIS ) 5 MG tablet    omeprazole  (PRILOSEC) 20 mg capsule    atorvastatin  (LIPITOR) 40 mg  tablet    dalfampridine  (AMPYRA ) 10 MG tablet    Calcium  Citrate-Vitamin D  (CALCIUM  CITRATE +D PO)    ocrelizumab  (OCREVUS ) 300 MG/10ML injection    ascorbic acid (VITAMIN C) 100 MG tablet    Cyanocobalamin  (VITAMIN B-12 CR PO)    diazePAM  (VALIUM ) 5 mg tablet    zolpidem  (AMBIEN  CR) 12.5 mg CR tablet     No current facility-administered medications for this visit.       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        Exercise -- doubles tennis or pickle ball up to 5 times weekly, joined [employer] and plans to add some resistance machines.        Diet -- varied, more lean proteins, complex carbs, not a lot of dessert, some greens.  Protein shakes at bkfst and lunch, flax, seeds, yogurt.        Sleep --  active dreams, variable sleep success.  No GU interruption.        Safety -- seatbelt and no distractions, smoke detectors, sunglasses, plans to be more consistent with skin block.       Family History   Problem Relation Age of Onset    Heart Disease Mother         coronary art disease, not premature    Cancer Father         multiple myeloma    Heart Disease Brother         aneurism-07-09-18    Cancer Maternal Grandmother         unknown    Multiple Sclerosis Neg Hx     Lupus Neg Hx     Rheum arthritis Neg Hx     Thyroid  disease Neg Hx     Diabetes Neg Hx     Colon cancer Neg Hx     Colon polyps Neg Hx     High Blood Pressure Neg Hx     Rectal cancer Neg Hx        Review of Systems:  Review of Systems   Constitutional:  Negative for chills, fever, malaise/fatigue and weight loss.   HENT:  Negative for hearing loss.    Eyes:  Negative for blurred vision and pain.   Respiratory:  Negative for cough and shortness of breath.    Cardiovascular:  Negative for chest pain, palpitations and orthopnea.   Gastrointestinal:  Negative for abdominal pain, blood in stool, constipation, diarrhea, heartburn, nausea and vomiting.   Genitourinary:   Negative for dysuria.   Musculoskeletal:  Negative for joint pain and myalgias.   Skin:  Negative for rash.   Neurological:  Negative for headaches.   Psychiatric/Behavioral:  Negative for depression. The patient is not nervous/anxious.            Physical examination:  Physical Exam  Constitutional:       General: He is not in acute distress.     Appearance: He is not ill-appearing.   HENT:      Head: Normocephalic and atraumatic.   Cardiovascular:      Rate and Rhythm: Normal rate and regular rhythm.   Pulmonary:      Effort: Pulmonary effort is normal.      Breath sounds: Normal breath sounds.   Neurological:      General: No focal deficit present.      Mental Status: He is oriented to person, place, and time.           Health Care Maintenance:         Screening    Indicated/Done Not Indicated Declined by Patient Not Done   Labs [x]  Reviewed and discussed with patient []   []   []     Health Care Proxy [x]  Discussed and forms given to patient to fill out and return to the practice []   []  Declined  []     Diet, Weight, Exercise [x]  Reviewed and discussed with patient []   []  Declines Counselling []     Tobacco and EtOH use [x]  Non smoker, occasional alc use []   []  Declines Counselling []     Colon Cancer Screening [x]  Up to date or Cologuard/Colonoscopy referral made []   [] Counselled, Declines []     LDCT for Lung Ca Screening []  Smoker/ex-smoker 50-74 yo, +30 pk-yrs, still smokes/quit <15 yrs ago, Low Dose CT scan ordered [x]   [] Counselled, Declines []     HCV Screening [x]  neg []   [] Counselled, Declines  []   Skin Ca Awareness [x]  Awareness and prevention including sunscreen discussed []   []  Declines Counselling []     STD/Safe Sexual Practice [x]  Safe sex, contraception discussed []  [] Declines Counselling []    Eye/Dental Care [x]  Routine eye and dental care discussed []  [] Declines Counselling []    Motor Vehicle/Sports Safety [x]  Discussed including helmet and seatbelt use and avoidance of texting while driving []   [] Declines Counselling []    Smoke/CO Detectors [x]  Working smoke/ Dietitian discussed []  [] Declines Counselling []    Influenza Vaccine [] Up to date or received today [x]  []  Declines []    HBV Vaccination []  Need assessed(sexually active adults, considering foreign adoption, DM, chronic liver dz), discussed with patient, ordered today [x]  [] Declines Counselling []    Pneumococcal Vaccination [x] Need assessed (>65, Immune def, CHF, DM, EtOHism, cirrhosis, smoker), discussed with patient and ordered today []  [] Declines Counselling []    TDaP [x]  Up to date or received today []   [] Declines Counselling  []     Herpes Zoster Vaccine [x]  Shingrix  discussed and advised patient to contact insurer regarding coverage. []  <60 years old  [] Declines Counselling []     PSA [x]  Counseled []   []   []             Labs/ Diagnostics:    Clinical Support on 02/21/2024   Component Date Value Ref Range Status    IgG 02/21/2024 724  700 - 1,600 mg/dL Final    IgM 86/57/8469 160  40 - 230 mg/dL Final    IgA 62/95/2841 137  70 - 400 mg/dL Final    Sodium 32/44/0102 139  133 - 145 mmol/L Final    Potassium 02/21/2024 4.0  3.3 - 5.1 mmol/L Final    Chloride 02/21/2024 103  96 - 108 mmol/L Final    CO2 02/21/2024 24  20 - 28 mmol/L Final    Anion Gap 02/21/2024 12  7 - 16 Final    UN 02/21/2024 18  6 - 20 mg/dL Final    Creatinine 72/53/6644 1.07  0.67 - 1.17 mg/dL Final    eGFR BY CREAT 02/21/2024 73  * Final    Comment: eGFR by creatinine was calculated using the CKD-EPI  creatinine refit equation without race variable (1).    An eGFR < 60 mL/min/1.73 m2 suggests kidney disease in  those below the age of 33. People aged >=65 may or may  not have CKD with eGFR < 60 mL/min/1.73 m2.    A concurrent blood test for cystatin C and the use of  an equation that combines both serum creatinine and  cystatin C provides a more accurate GFR estimate than  one using creatinine or cystatin  C  alone.(https://www.kidney.org/professionals/kdoqi/gfr_calculator).    1. Am J Kidney Dis. 2021 Sep 22:S0272-6386(21)00828-3.      Glucose 02/21/2024 69  60 - 99 mg/dL Final    Comment: Reference Ranges apply only to FASTING samples.    ADA Guidelines Blood Sugar Levels for Diagnosing Diabetes & Pre-diabetes  Normal: < 100 mg/dL  Impaired Fasting Glucose (IFG): 100-125 mg/dL  Diabetes:  > 034 mg/dL on two different occasions      Calcium  02/21/2024 9.1  8.6 - 10.2 mg/dL Final    Total Protein 02/21/2024 6.0 (L)  6.3 - 7.7 g/dL Final    Albumin 74/25/9563 4.1  3.5 - 5.2 g/dL Final    Bilirubin,Total 02/21/2024 0.9  0.0 - 1.2 mg/dL Final    AST 87/56/4332 63 (H)  0 - 50 U/L Final    Interpret result with caution-slightly hemolyzed  ALT 02/21/2024 81 (H)  0 - 50 U/L Final    Alk Phos 02/21/2024 87  40 - 130 U/L Final    WBC 02/21/2024 5.4  3.5 - 11.0 THOU/uL Final    RBC 02/21/2024 4.9  4.0 - 6.0 MIL/uL Final    Hemoglobin 02/21/2024 14.1  12.0 - 17.0 g/dL Final    Hematocrit 09/81/1914 43  37 - 52 % Final    MCV 02/21/2024 88  75 - 100 fL Final    RDW 02/21/2024 12.5  0.0 - 15.0 % Final    Platelets 02/21/2024 189  150 - 450 THOU/uL Final    Seg Neut % 02/21/2024 63.5  % Final    Neut # K/uL 02/21/2024 3.4  1.5 - 6.5 THOU/uL Final    Lymph # K/uL 02/21/2024 1.1  1.0 - 5.0 THOU/uL Final    Mono # K/uL 02/21/2024 0.6  0.1 - 1.0 THOU/uL Final    Eos # K/uL 02/21/2024 0.1  0.0 - 0.5 THOU/uL Final    Baso # K/uL 02/21/2024 0.0  0.0 - 0.2 THOU/uL Final    Nucl RBC % 02/21/2024 0.0  0.0 - 0.2 /100 WBC Final    Nucl RBC # K/uL 02/21/2024 0.0  0.0 - 0.0 THOU/uL Final    IMM Granulocytes # 02/21/2024 0.0  0.0 - 0.0 THOU/uL Final    IMM Granulocytes 02/21/2024 0.6  % Final           Assessment and plan:    1. Preventative health care (Primary)  Normal medicare wellness    2. Annual physical exam  -normal annual physical, anticipatory guidance provided, HM updated, screening labs ordered, return in 1 year for annual physical  or earlier for routine follow up or as needed for new concerns                 [1] No Known Allergies (drug, envir, food or latex)  [2]   Social History  Socioeconomic History    Marital status: Married   Tobacco Use    Smoking status: Never    Smokeless tobacco: Never   Substance and Sexual Activity    Alcohol use: Not Currently     Comment: Very occasionally    Drug use: Yes     Types: Marijuana     Comment: occ    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        Exercise -- doubles tennis or pickle ball up to 5 times weekly, joined [employer] and plans to add some resistance machines.        Diet -- varied, more lean proteins, complex carbs, not a lot of dessert, some greens.  Protein shakes at bkfst and lunch, flax, seeds, yogurt.        Sleep -- active dreams, variable sleep success.  No GU interruption.        Safety -- seatbelt and no distractions, smoke detectors, sunglasses, plans to be more consistent with skin block.

## 2024-02-22 NOTE — Progress Notes (Signed)
 Visit performed as:            Office Visit, met with patient in person    Today we reviewed and updated [name]'s smoking status, activities of daily living, depression screen, fall risk, medications and allergies.   I have counseled the patient in the above areas.     Subjective:     Chief Complaint: [name] is a 73 y.o. male here for a/an Subsequent Annual Medicare Visit and Annual Exam    In general, [name] rates their overall health as:  good      Patient Care Team:  Esther Hem, MD as PCP - General (Primary Care)  Jossie Nine, MD as Provider Team (Neurology)  Deloras Fess Jaclynn Mast, MD as Provider Team (Cardiology)  Rojean Cleaves, MD as Provider Team (Ophthalmology)  Marlise Simpers, MD as Provider Team (Dermatology)  Urology at [facility]  Dr Poles: colorectal    Medications Ordered Prior to Encounter[1]  Allergies[2]  Patient Active Problem List    Diagnosis Date Noted    Multiple sclerosis-Re-enrolled MSPATHS 001, 01/15/20 05/30/2008     Priority: High     Relapsing; remitting, doing well on oral Tecfidera .  Taking coenzyme Q10 for muscle irritability, seems better.        Mild reactive airways disease 11/28/2012     Priority: Medium     See 2021 allergy testing, all negative!      Thoracic aortic aneurysm (TAA) 02/22/2009     Priority: Medium     mild- 4.0 cm  by CT scan 5/10  See 6/13 echo at [facility] describing no change, plan for 24 month f/u  See [id number] [facility] note.  See 04/06/16 echo ([facility]) mild.        Hyperlipidemia 05/30/2008     Priority: Medium     Normal TG, just normal HDL, and LDL as high as 150's at times.  No premature CVD in family, mother with CAD late.  Started statin in 2014, supplementing with coenzyme Q10 to minimize muscle side effects.      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      Enlarged prostate with lower urinary tract symptoms (LUTS) 01/26/2017    Environmental allergies 05.25.18    Extrinsic allergic asthma 11/28/2012    Blanca Bunch follows, usually on max therapy in anticipation of bad ragweed season.     HLD (hyperlipidemia)     Hyperlipidemia 05/30/2008    Normal TG, just normal HDL, and LDL as high as 150's at times. No premature CVD in family, mother with CAD late. Started statin in 2014, supplementing with coenzyme Q10 to minimize muscle side effects.     Incomplete Right Bundle Branch Block 05/30/2008          Multiple sclerosis     Osteoarthritis of right knee 02/01/2014    Psoriasis     S/P R TKA 07/31/2019 07/31/2019  Thoracic aortic aneurysm (TAA) 02/22/2009    mild- 4.0 cm  by CT scan 5/10 See 6/13 echo at [facility] describing no change, plan for 24 month f/u See [id number] [facility] note. See 04/06/16 echo ([facility]) mild.     Tic disorder      Past Surgical History:   Procedure Laterality Date    COLONOSCOPY      HERNIA REPAIR      HX TONSILLECTOMY/ADENOIDECTOMY      INCISIONAL HERNIA REPAIR      JOINT REPLACEMENT  07/30/2028    Right knee replacement    KNEE ARTHROSCOPY Right     OTHER SURGICAL HISTORY      MS    PR ARTHRP KNE CONDYLE&PLATU MEDIAL&LAT COMPARTMENTS Right 07/31/2019    Procedure: RIGHT TOTAL KNEE ARTHROPLASTY;  Surgeon: Marget Sheffield, MD;  Location: [facility];  Service: Orthopedics    VASECTOMY       Family History   Problem Relation Age of Onset    Heart Disease Mother         coronary art disease, not premature    Cancer Father         multiple myeloma    Heart Disease Brother         aneurism-07-09-18    Cancer Maternal Grandmother         unknown     Multiple Sclerosis Neg Hx     Lupus Neg Hx     Rheum arthritis Neg Hx     Thyroid  disease Neg Hx     Diabetes Neg Hx     Colon cancer Neg Hx     Colon polyps Neg Hx     High Blood Pressure Neg Hx     Rectal cancer Neg Hx      Social History[3]    Objective:     Vital Signs: BP 112/78   Pulse 72   Temp 36.6 C (97.9 F)   Ht 1.803 m (5\' 11" )   Wt 78.5 kg (173 lb)   SpO2 96%   BMI 24.13 kg/m    BMI: Body mass index is 24.13 kg/m.    Vision Screening Results (Welcome visit only):  No results found.    Depression Screening Results:  Review Flowsheet  More data exists         02/22/2024 02/22/2023 05/04/2022 02/24/2021 02/06/2020 07/03/2019 05/17/2019   PHQ Scores   PSQ2 Q1 - Interest/Pleasure - - - - N N N   PSQ2 Q2 - Down, Depressed, Hopeless - - - - N N N   PHQ Calculated Score 0 0 0 0 - - -     No questionnaires on file.   Opioid Use/DAST- 10 Screening Results:   How many times in the past year have you used an illegal drug or used a prescription medication for nonmedical reasons?: 0 (02/22/2024  9:13 AM)    Activities of Daily Living/Functional Screening Results:  Is the person deaf or does he/she have serious difficulty hearing?: N (02/22/2024  9:13 AM)  Is this person blind or does he/she have serious difficulty seeing even when wearing glasses?: N (02/22/2024  9:13 AM)  *Vision Status: Visual aid  (02/22/2024  9:13 AM)  Does this person have serious difficulty walking or climbing stairs?: N (02/22/2024  9:13 AM)  Does this person have difficulty dressing or bathing?: N (02/22/2024  9:13 AM)  *Shopping: Independent (02/22/2024  9:13 AM)  *House Keeping: Independent (02/22/2024  9:13 AM)  *Managing Own  Medications: Independent (02/22/2024  9:13 AM)  *Handling Finances: Independent (02/22/2024  9:13 AM)  Difficulty doing errands due to a physicial, mental or emotional condition: No (02/22/2024  9:13 AM)  Difficulty remembering or making decisions due to a physicial, mental or emotional condition: No (02/22/2024  9:13  AM)      Fall Risk Screening Results:  Have you fallen in the last year?: Yes (02/22/2024  9:12 AM)  Did you sustain an injury which required medical attention?: No (02/22/2024  9:12 AM)  Do you feel you are at risk for falling?: Yes (02/22/2024  9:12 AM)  Do you feel your risk of falling is: : High (02/22/2024  9:12 AM)  Would you like to learn more about how to reduce your risk of falling?: No (02/22/2024  9:12 AM)      Assessment and Plan:     Cognitive Function:  Recall of recent and remote events appears:  Normal      Advanced Care Planning:  was discussed and the paperwork can be found in the scanned media section     The following health maintenance plan was reviewed with the patient:    Health Maintenance Topics with due status: Postponed       Topic Postponed Until    HIV Screening USPSTF[place] 01/24/2039 (Originally 02/27/1964)     Health Maintenance Topics with due status: Not Due       Topic Last Completion Date    IMM DTaP/Tdap/Td 12/19/2021    Colon Cancer Screening USPSTF 04/16/2023    COVID-19 Vaccine 09/18/2023    Depression Screen Yearly 02/22/2024    Fall Risk Screening 02/22/2024     Health Maintenance Topics with due status: Completed       Topic Last Completion Date    IMM Pneumo: 50+ Years 11/05/2016    IMM-Zoster 05/07/2017    Hepatitis C Screening USPSTF/ 03/30/2020    IMM-Influenza 07/13/2023     Health Maintenance Topics with due status: Aged [employer] Date Due    IMM-Hepatitis B Vaccine Aged Out    IMM-HIB 0-5 Yrs or At-Risk Patients Aged Out    IMM-HPV 9-26 Yrs or Shared Decision (27-45 Yrs) Aged Out    IMM-MCV4 0-18 Yrs or At-Risk Patients Aged Out    IMM-Rotavirus 0-8 Months Aged Out    IMM-MenB (2 Plans: Shared decision & Increased Risk Plans) Aged Out     This health maintenance schedule, identified risks, a list of orders placed today and patient goals have been provided to [name] in the after visit summary.     Plan for any concerns identified during screening or risk  assessments:  Up to date on everything           [1]   Current Outpatient Medications on File Prior to Visit   Medication Sig Dispense Refill    tadalafil  (CIALIS ) 5 MG tablet Take 1 tablet (5 mg total) by mouth daily. 90 tablet 1    omeprazole  (PRILOSEC) 20 mg capsule Take 1 capsule (20 mg total) by mouth daily (before breakfast). 90 capsule 0    atorvastatin  (LIPITOR) 40 mg tablet TAKE 1 TABLET NIGHTLY 90 tablet 3    dalfampridine  (AMPYRA ) 10 MG tablet TAKE 1 TABLET TWICE A DAY 180 tablet 2    Calcium  Citrate-Vitamin D  (CALCIUM  CITRATE +D PO) Take 1 capsule by mouth daily.      ocrelizumab  (OCREVUS ) 300 MG/10ML injection Administer into the vein every 6 months  ascorbic acid (VITAMIN C) 100 MG tablet Take 1 tablet (100 mg total) by mouth daily.      Cyanocobalamin  (VITAMIN B-12 CR PO) Take 1,000 mcg by mouth daily         diazePAM  (VALIUM ) 5 mg tablet for Feeling Anxious. Take 1 tab PO 60 minutes before MRI. May repeat x2 PRN anxiety. MDD: 3 tablets 3 tablet 0    zolpidem  (AMBIEN  CR) 12.5 mg CR tablet Take 1 tablet (12.5 mg total) by mouth nightly as needed for Sleep. Max daily dose: 12.5 mg Swallow whole. Do not crush, break, or chew. 30 tablet 0     No current facility-administered medications on file prior to visit.   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        Exercise -- doubles tennis or pickle ball up to 5 times weekly, joined [employer] and plans to add some resistance machines.        Diet -- varied, more lean proteins, complex carbs, not a lot of dessert, some greens.  Protein shakes at bkfst  and lunch, flax, seeds, yogurt.        Sleep -- active dreams, variable sleep success.  No GU interruption.        Safety -- seatbelt and no distractions, smoke detectors, sunglasses, plans to be more consistent with skin block.

## 2024-02-23 ENCOUNTER — Ambulatory Visit: Payer: Self-pay | Admitting: Family Medicine

## 2024-02-23 LAB — HEMOGLOBIN A1C: Hemoglobin A1C: 5.3 %

## 2024-02-24 ENCOUNTER — Ambulatory Visit: Payer: Medicare (Managed Care)

## 2024-02-29 ENCOUNTER — Other Ambulatory Visit: Payer: Self-pay

## 2024-03-01 ENCOUNTER — Encounter: Payer: Self-pay | Admitting: Neurology

## 2024-03-01 ENCOUNTER — Encounter: Payer: Self-pay | Admitting: Family Medicine

## 2024-03-01 ENCOUNTER — Ambulatory Visit: Payer: Medicare (Managed Care) | Attending: Neurology | Admitting: Neurology

## 2024-03-01 VITALS — BP 120/74 | HR 67 | Temp 97.0°F | Wt 171.3 lb

## 2024-03-01 DIAGNOSIS — G35 Multiple sclerosis: Secondary | ICD-10-CM | POA: Insufficient documentation

## 2024-03-01 DIAGNOSIS — K219 Gastro-esophageal reflux disease without esophagitis: Secondary | ICD-10-CM

## 2024-03-01 NOTE — Progress Notes (Signed)
 Multiple Sclerosis Clinic Follow-up Visit    Subjective:  [name] is a 73 y.o. 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 SInce his last visit he notes that he is more clumbsy, feet quite numb. Had a fall 1 month ago caught his ?L foot, walking in a parking lot.     A recent trip to [place], used 2 hiking poles with benefit.    Uses walking sticks  on longer walks, 1 better than none, 2 better than 1, thinking of other situations they might be useful in.     Taking the Ampyra .     Will restart exercise at gym, golfs to stay active.    Good memory and mood.  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    Feet numbness and tingling,not painful    Ocrevus  last dosed earlier this month. Well tolerated.  Not getting infections.  COVID and flu vaccines UTD.    Will be living in [facility] for half the year.    MS History:    Clinical presentations:  1990's R arm weakness  Later numbness hands and feet and stereotyped abnormal distal R thigh sensation  2024/25 progressive gait dysfunction    MRIs:  4/08 brain-relatively mild lesion burden, 1 enhancing lesion at the time  5/13 brain-no new lesions  11/14 brain-unchanged  10/16 brain-unchanged  4/18 brain-unchanged                 Thoracic-few, non-enhancing intramedullary lesions  12/20 brain-unchanged                  Thoracic cord- at least 3 new intramedullary lesions compared with 2018  6/21 brain and thoracic cord-unchanged  10/22: brain unchanged  9/23 brain-stable. Cervical-intramedullary lesions C2, C3, C4, C5, C7  5/25 brain-stable. Thoracic-unchanged intramedullary lesions.    Other testing:  12/20 JC index 0.33, indeterminate to negative  6/21 natalizumab  antibody +  5/25 B cells 0. IgG and IgM wnl  5/25 1.07 Cr    Disease modifying treatment hx:  Betaseron  started in 1990's, stopped due to injection site reactions/infections  Tecfidera  started 5/13, stopped due to new thoracic cord lesions  1/21 Tysabri  started, stopped due to +natalizumab   antibody  Ocrevus  started 7/21 and continued through the present    Symptomatic treatment hx:  ED-Cialis  helps  Foot cramps-baclofen  can be helpful  Stereotyped R thigh abnormal sensation-less bothersome, not certain if Tegretol  helped  Uncomfortable numbness feet-Lyrica  not helpful and caused side effects. Gabapentin  caused him to feel loopy  Knee pain-acupuncture  Gait-Ampyra , gait worsened when it was stopped. PT less helpful in the past. 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 Lives with his wife. Has 3 children. Never smoker. Winters in [place]. Enjoys golf and swimming.     Meds:  Current Outpatient Medications on File Prior to Visit   Medication Sig Dispense Refill    dalfampridine  (AMPYRA ) 10 MG tablet TAKE 1 TABLET TWICE A DAY 180 tablet 2    ocrelizumab  (OCREVUS ) 300 MG/10ML injection Administer into the vein every 6 months      diazePAM  (VALIUM ) 5 mg tablet for Feeling Anxious. Take 1 tab PO 60 minutes before MRI. May repeat x2 PRN anxiety. MDD: 3 tablets (Patient not taking: Reported on 03/01/2024) 3 tablet 0    tadalafil  (CIALIS ) 5 MG tablet Take 1 tablet (5 mg total) by mouth daily. 90 tablet 1    atorvastatin  (LIPITOR) 40 mg tablet TAKE 1 TABLET NIGHTLY 90 tablet 3    Calcium  Citrate-Vitamin D  (CALCIUM  CITRATE +D PO) Take 1 capsule by mouth daily.      ascorbic acid (VITAMIN C) 100 MG tablet Take 1 tablet (100 mg total) by mouth daily.       Cyanocobalamin  (VITAMIN B-12 CR PO) Take 1,000 mcg by mouth daily          No current facility-administered medications on file prior to visit.        Vitals  Blood pressure 120/74, pulse 67, temperature 36.1 C (97 F), temperature source Temporal, weight 77.7 kg (171 lb 4.8 oz), SpO2 97%.    MENTAL STATUS: awake and alert; Appropriately oriented. language intact to casual conversation. affect was appropriate to situation    CRANIAL NERVES:    pursuits a little choppy, versions intact without nystagmus. facial expression symmetric. No dysarthria.    MOTOR:  - increased tone in LE  F extension 5/5     Hip flexion 4/4   Ankle dorsiflexion 4+/4+     - no pronator drift; no abnormal movements    COORDINATION:  - finger to nose normal, no ataxia on exam  - finger tapping was rapid and accurate bilaterally    GAIT:  - wide base and spastic with some steppage    QUANTITATIVE SCORES:  Timed 25-foot walk (sec): 6.05  Assistive device: none      Assesment and Plan:  73 y.o. M with progressive MS with activity with some difficulty with gait/leg strength. He uses walking sticks when going on long distances. Tolerates Ocrevus , so will continue this to prevent activity, although will not fully address the progressive aspect of the MS. OFten when a patient is in their 70's the risk of Ocrevus  outweighs the benefit. But in this specific case, he had relatively recent new cord lesions, indicating a more inflammatory case than is typical at this age. So in this specific case, the benefits of OCrevus  continue to outweigh the risks. At future visits we will continue to consider the balance of risk and benefit.    Symptomatically will continue with Ampyra  and regular exercise.     DMT  - continue Ocrevus  q6 mo  - will stay up to date on flu and RSV vaccine    Monitoring  - Labs: CBC, CMP, lymphocyte subsets, immunoglobulins q2mo with infusions  - Imaging: MRI UTD    Symptomatic treatment  - Ongoing exercise and use of Ampyra  for  symptomatic tx    Follow up   - 11/25    Jedrick Hutcherson, MD  Neuroimmunology    34 minutes spent with patient (>50% spent in counseling and coordination of care), in review of the  chart  on the same day as the visit.

## 2024-03-02 ENCOUNTER — Encounter: Payer: Self-pay | Admitting: Gastroenterology

## 2024-03-02 MED ORDER — OMEPRAZOLE 20 MG PO CPDR *I*
20.0000 mg | DELAYED_RELEASE_CAPSULE | Freq: Every day | ORAL | 1 refills | Status: DC
Start: 2024-03-02 — End: 2024-08-17

## 2024-03-02 NOTE — Telephone Encounter (Signed)
 Last office visit:   02/22/2024  Patients upcoming appointments:  Future Appointments   Date Time Provider Department Center   09/06/2024  8:00 AM [facility] None   09/06/2024  9:00 AM Jossie Nine, MD [facility] None   02/27/2025  9:20 AM Alyne Jules, Dow Gemma, MD [facility] None     Recent Lab results:  GENERAL CHEMISTRY   Recent Labs     02/21/24  0824 08/23/23  1348   NA 139 139   K 4.0 4.3   CL 103 102   CO2 24 27   GAP 12 10   UN 18 13   CREAT 1.07 1.08   GLU 69 75   CA 9.1 9.0      LIPID PROFILE   Recent Labs     02/21/24  0824   CHOL 130   TRIG 52   HDL 61*   LDLC 57      LIVER PROFILE   Recent Labs     02/21/24  0824 08/23/23  1348   ALT 81* 25   AST 63* 29   ALK 87 90   TB 0.9 0.8      DIABETES THYROID    Recent Labs     02/21/24  0824   HA1C 5.3    No value within the past 365 days      Pending/Orders Labs:  Lab Frequency Next Occurrence   MR head without contrast Once 02/11/2024   MR spine thoracic without contrast Once 02/11/2024   Hemoglobin A1c Once 02/22/2024   Lipid Panel (Reflex to Direct  LDL if Triglycerides more than 400) Once 02/22/2024        Opioid Drug Screen:  No results for input(s): "AMPU", "BEU", "OPSU", "OXYU", "THCU", "BZDU", "COPS", "CTHC", "UCRNC", "TRAMD" in the last 8760 hours.    Last dispensed if controlled:

## 2024-04-04 ENCOUNTER — Encounter: Payer: Self-pay | Admitting: Gastroenterology

## 2024-04-10 ENCOUNTER — Other Ambulatory Visit: Payer: Self-pay | Admitting: Family Medicine

## 2024-04-10 MED ORDER — TADALAFIL 5 MG PO TABS *I*: 5.0000 mg | ORAL_TABLET | Freq: Every day | ORAL | 1 refills | Status: DC

## 2024-04-10 NOTE — Telephone Encounter (Signed)
 Last office visit:   02/22/2024  Patients upcoming appointments:  Future Appointments   Date Time Provider Department Center   09/06/2024  8:00 AM [facility] None   09/06/2024  9:00 AM [name], MD [facility] None   02/27/2025  9:20 AM [name], [name], MD [facility] None     Recent Lab results:  GENERAL CHEMISTRY   Recent Labs     02/21/24  0824 08/23/23  1348   NA 139 139   K 4.0 4.3   CL 103 102   CO2 24 27   GAP 12 10   UN 18 13   CREAT 1.07 1.08   GLU 69 75   CA 9.1 9.0      LIPID PROFILE   Recent Labs     02/21/24  0824   CHOL 130   TRIG 52   HDL 61*   LDLC 57      LIVER PROFILE   Recent Labs     02/21/24  0824 08/23/23  1348   ALT 81* 25   AST 63* 29   ALK 87 90   TB 0.9 0.8      DIABETES THYROID    Recent Labs     02/21/24  0824   HA1C 5.3    No value within the past 365 days      Pending/Orders Labs:  Lab Frequency Next Occurrence   Hemoglobin A1c Once 02/22/2024   Lipid Panel (Reflex to Direct  LDL if Triglycerides more than 400) Once 02/22/2024        Opioid Drug Screen:  No results for input(s): AMPU, BEU, OPSU, OXYU, THCU, BZDU, COPS, CTHC, UCRNC, TRAMD in the last 8760 hours.    Last dispensed if controlled:

## 2024-05-16 ENCOUNTER — Encounter: Payer: Self-pay | Admitting: Neurology

## 2024-05-16 MED ORDER — DULOXETINE HCL 30 MG PO CPEP *I*
30.0000 mg | DELAYED_RELEASE_CAPSULE | Freq: Every day | ORAL | 11 refills | Status: DC
Start: 2024-05-16 — End: 2024-06-30

## 2024-05-16 NOTE — Telephone Encounter (Signed)
 Yes, Cymbalta  is a good idea. We can try that. I've let the pt know by Mychart.

## 2024-05-16 NOTE — Telephone Encounter (Signed)
Will defer pt's MyChart message to Dr. Robb for recommendations.  See MyChart messages for further details.

## 2024-06-28 ENCOUNTER — Encounter: Payer: Self-pay | Admitting: Neurology

## 2024-06-28 NOTE — Telephone Encounter (Signed)
Will defer pt's MyChart message to Dr. Robb for recommendations.  See MyChart messages for further details.

## 2024-06-30 ENCOUNTER — Other Ambulatory Visit: Payer: Self-pay

## 2024-06-30 MED ORDER — DULOXETINE HCL 60 MG PO CPEP *I*
60.0000 mg | DELAYED_RELEASE_CAPSULE | Freq: Every day | ORAL | 3 refills | Status: DC
Start: 2024-06-30 — End: 2024-09-06

## 2024-06-30 NOTE — Telephone Encounter (Signed)
 See MyChart message. Pended Cymbalta  60 mg daily for a 90 day supply in separate refill encounter and forwarded this to Dr. Lucky.

## 2024-06-30 NOTE — Telephone Encounter (Signed)
 [name] sent a MyChart message stating that he would like to try the 60 mg dose of Cymbalta  to see if this helps with the burning sensation in his feet. Pended new orders for Cymbalta  60 mg (90 day supply per his request) and will forward to Dr. Lucky for signature. Once signed, this will go to [facility]. Date of last visit and plan for FUV: 5/21/25Date of FUV? 11/26/25Date of last MRI and plan for next: 5/5/25Is MRI ordered? None ordered DMT (including alternative dosing if applicable): ocrevusLabs:  Lab results: 05/12/250824 Sodium 139 Potassium 4.0 Chloride 103 CO2 24 UN 18 Creatinine 1.07 Glucose 69 Calcium  9.1   Lab results: 05/12/250824 Total Protein 6.0* Albumin 4.1 ALT 81* AST 63* Alk Phos 87 Bilirubin,Total 0.9   Lab results: 05/12/250824 WBC 5.4 Hemoglobin 14.1 Hematocrit 43 RBC 4.9 Platelets 189 Neut # K/uL 3.4 Lymph # K/uL 1.1 Mono # K/uL 0.6 Eos # K/uL 0.1 Baso # K/uL 0.0 Seg Neut % 63.5

## 2024-07-18 ENCOUNTER — Other Ambulatory Visit: Payer: Self-pay | Admitting: Adult Health

## 2024-07-18 DIAGNOSIS — G35D Multiple sclerosis, unspecified: Secondary | ICD-10-CM

## 2024-07-18 MED ORDER — DALFAMPRIDINE 10 MG PO TB12 *I*
10.0000 mg | ORAL_TABLET | Freq: Two times a day (BID) | ORAL | 1 refills | Status: DC
Start: 2024-07-18 — End: 2024-08-29

## 2024-07-18 NOTE — Telephone Encounter (Signed)
 Rx refill

## 2024-07-18 NOTE — Telephone Encounter (Signed)
 Per ISTOP, last dispensed:Date of last visit and plan for FUV:05/21/25Date of FUV?11/26/25Date of last MRI and plan for next:5/25Is MRI ordered?DMT (including alternative dosing if applicable):Labs:  Lab results: 05/12/250824 Sodium 139 Potassium 4.0 Chloride 103 CO2 24 UN 18 Creatinine 1.07 Glucose 69 Calcium  9.1   Lab results: 05/12/250824 Total Protein 6.0* Albumin 4.1 ALT 81* AST 63* Alk Phos 87 Bilirubin,Total 0.9   Lab results: 05/12/250824 WBC 5.4 Hemoglobin 14.1 Hematocrit 43 RBC 4.9 Platelets 189 Neut # K/uL 3.4 Lymph # K/uL 1.1 Mono # K/uL 0.6 Eos # K/uL 0.1 Baso # K/uL 0.0 Seg Neut % 63.5

## 2024-08-17 ENCOUNTER — Other Ambulatory Visit: Payer: Self-pay | Admitting: Family Medicine

## 2024-08-17 DIAGNOSIS — K219 Gastro-esophageal reflux disease without esophagitis: Secondary | ICD-10-CM

## 2024-08-17 MED ORDER — OMEPRAZOLE 20 MG PO CPDR *I*
20.0000 mg | DELAYED_RELEASE_CAPSULE | Freq: Every day | ORAL | 1 refills | Status: AC
Start: 2024-08-17 — End: 2024-11-15

## 2024-08-17 NOTE — Telephone Encounter (Signed)
 Last office visit: Last Office Visit   Date Provider Department Visit Type Primary Dx  02/22/2024 [name], MD [name] Office Visit Preventative health care   Last PA Office Visit  None  Patients upcoming appointments:Future Appointments Date Time Provider Department Center 09/06/2024  8:00 AM [facility] None 09/06/2024  9:00 AM [name], MD [facility] None 02/27/2025  9:20 AM [name], [name], MD [facility] None Recent Lab results:GENERAL CHEMISTRY Recent Labs   05/12/250824 11/11/241348 NA 139 139 K 4.0 4.3 CL 103 102 CO2 24 27 GAP 12 10 UN 18 13 CREAT 1.07 1.08 GLU 69 75 CA 9.1 9.0  LIPID PROFILE Recent Labs   05/12/250824 CHOL 130 TRIG 52 HDL 61* LDLC 57  LIVER PROFILE Recent Labs   05/12/250824 11/11/241348 ALT 81* 25 AST 63* 29 ALK 87 90 TB 0.9 0.8  DIABETES THYROID  Recent Labs   05/12/250824 HA1C 5.3  No value within the past 365 days  Pending/Orders Labs:Lab Frequency Next Occurrence Hemoglobin A1c Once 02/22/2024 Lipid Panel (Reflex to Direct  LDL if Triglycerides more than 400) Once 02/22/2024  Opioid Drug Screen:No results for input(s): AMPU, BEU, OPSU, OXYU, THCU, BZDU, COPS, CTHC, UCRNC, TRAMD in the last 8760 hours.Last dispensed if controlled:

## 2024-08-21 ENCOUNTER — Telehealth: Payer: Self-pay | Admitting: Family Medicine

## 2024-08-21 ENCOUNTER — Ambulatory Visit: Payer: Medicare (Managed Care)

## 2024-08-21 NOTE — Telephone Encounter (Signed)
 Debbie calling from [employer] ; states received refill request from [facility] [place] [place] - [address]  to fill the omeprazole , however, they just need a verbal from PCP to fill; please call pharmacy at [phone] and use [phone]

## 2024-08-29 ENCOUNTER — Other Ambulatory Visit: Payer: Self-pay | Admitting: Neurology

## 2024-08-29 DIAGNOSIS — G35D Multiple sclerosis, unspecified: Secondary | ICD-10-CM

## 2024-08-30 ENCOUNTER — Telehealth: Payer: Self-pay

## 2024-08-30 MED ORDER — DALFAMPRIDINE 10 MG PO TB12 *I*: 10.0000 mg | ORAL_TABLET | Freq: Two times a day (BID) | ORAL | 1 refills | Status: DC

## 2024-08-30 NOTE — Telephone Encounter (Signed)
 Type of Form: medication management programDelivery Method: faxReceived From: excellusRelease Obtained: noneWhat to do with form after completion: fax back to [employer] @ [phone] was form placed: providers bin

## 2024-08-30 NOTE — Telephone Encounter (Signed)
 Date of last visit and plan for FUV: 05/21/25Date of FUV? 11/26/25Date of last MRI and plan for next: 5/5/25Is MRI ordered?  No DMT (including alternative dosing if applicable): Ocrevus   Labs:  Lab results: 05/12/250824 Sodium 139 Potassium 4.0 Chloride 103 CO2 24 UN 18 Creatinine 1.07 Glucose 69 Calcium  9.1     Lab results: 05/12/250824 Total Protein 6.0* Albumin 4.1 ALT 81* AST 63* Alk Phos 87 Bilirubin,Total 0.9   Lab results: 05/12/250824 WBC 5.4 Hemoglobin 14.1 Hematocrit 43 RBC 4.9 Platelets 189 Neut # K/uL 3.4 Lymph # K/uL 1.1 Mono # K/uL 0.6 Eos # K/uL 0.1 Baso # K/uL 0.0 Seg Neut % 63.5

## 2024-09-05 ENCOUNTER — Other Ambulatory Visit: Payer: Self-pay

## 2024-09-05 ENCOUNTER — Telehealth: Payer: Self-pay

## 2024-09-05 NOTE — Telephone Encounter (Signed)
 [name] is still interested and comfortable coming in for treatment.  I completed the COVID-19 screen, patient confirmed that they have not had any exposure and does not have any symptoms.  I explained that we are taking extra precautions and using some social distancing practices and patient knows to call if they start to develop symptoms or learns that they have come in contact with a confirmed case.Do you feel like you could have an infection or are you taking antibiotics for an infection? NOConfirmed with patient that they have had no insurance changes 09/05/2024 AMB [place] PRIOR AUTH  Prior Authorization Needed No  Prior Authorization Needed No  Treatment/Medication Name Ocrevus  600mg  every 6 months (Buy and bill)  Treatment/Medication Name Ocrevus  600mg  every 6 months (Buy and bill)  Date of Service  Authorization Type Procedure  Authorization Type Procedure  CPT Code J2350  CPT Code J2350  Diagnosis Code G35.D  Diagnosis Code Multiple sclerosis (G35.D)  Physician Name and NPI Harlene Dimes, MD  NPI: [id number]  Physician Name and NPI Harlene Dimes, MD  NPI: [id number]  Service Location [facility]  Service Location [facility]  Prior Authorization Status Authorization not required  Prior Authorization Status Authorization not required  Request Date 09/05/2024  Request Date 09/05/2024  Authorization Number  Authorization Start Date  Authorization End Date  Insurance Company Name [facility] Company Name MEDICARE PART A AND B  Policy Number [id number]  Policy Number [id number]  Comments [place], spoke to [place] SQUIBB Call [id number]#[phone] Patient has MES Plan, a PA is not needed, [employer] will follow Medicare A & B lines  Comments 11/25 No PA required per Medicare A & B LCD site, payment is based off medical necessity  Is this related to workers comp or MVA?

## 2024-09-06 ENCOUNTER — Ambulatory Visit: Payer: Medicare (Managed Care)

## 2024-09-06 ENCOUNTER — Ambulatory Visit: Payer: Medicare (Managed Care) | Attending: Neurology | Admitting: Neurology

## 2024-09-06 VITALS — BP 128/79 | HR 63 | Temp 97.7°F | Resp 16

## 2024-09-06 DIAGNOSIS — G35D Multiple sclerosis, unspecified: Secondary | ICD-10-CM | POA: Insufficient documentation

## 2024-09-06 DIAGNOSIS — G35A Relapsing-remitting multiple sclerosis: Secondary | ICD-10-CM

## 2024-09-06 LAB — CBC AND DIFFERENTIAL
Baso # K/uL: 0 THOU/uL (ref 0.0–0.2)
Eos # K/uL: 0.2 THOU/uL (ref 0.0–0.5)
Hematocrit: 42 % (ref 37–52)
Hemoglobin: 13.4 g/dL (ref 12.0–17.0)
IMM Granulocytes #: 0 THOU/uL (ref 0–0)
IMM Granulocytes: 0.4 %
Lymph # K/uL: 1.4 THOU/uL (ref 1.0–5.0)
MCV: 87 fL (ref 75–100)
Mono # K/uL: 0.8 THOU/uL (ref 0.1–1.0)
Neut # K/uL: 4.4 THOU/uL (ref 1.5–6.5)
Nucl RBC # K/uL: 0 THOU/uL
Nucl RBC %: 0 /100{WBCs} (ref 0.0–0.2)
Platelets: 219 THOU/uL (ref 150–450)
RBC: 4.9 MIL/uL (ref 4.0–6.0)
RDW: 13.1 % (ref 0.0–15.0)
Seg Neut %: 64.1 %
WBC: 6.9 THOU/uL (ref 3.5–11.0)

## 2024-09-06 LAB — LYMPHOCYTE SUBSET (T & B CELLS)
B LYM #(CD19): 2 {cells}/uL — ABNORMAL LOW (ref 120–725)
B LYM %(CD19): 0 % — ABNORMAL LOW (ref 7–36)
CD4#: 842 {cells}/uL (ref 496–2186)
CD4%: 59 % (ref 32–71)
CD4/CD8: 3.53 — ABNORMAL HIGH (ref 0.7–3.0)
CD8#: 238 {cells}/uL (ref 177–1137)
NK LYM #(CD16+56): 290 {cells}/uL (ref 37–758)
NK LYM %(CD16+56): 22 % (ref 4–26)
NK LYM# (CD3+16+56+): 35 {cells}/uL
NK LYM% (CD3+16+56+): 3 %
T LYM #(CD3): 1051 {cells}/uL (ref 754–2810)
T LYM %(CD3): 76 % (ref 54–87)
T Suppress %(CD8): 17 % (ref 10–38)

## 2024-09-06 LAB — COMPREHENSIVE METABOLIC PANEL
ALT: 47 U/L (ref 0–50)
AST: 48 U/L (ref 0–50)
Albumin: 4.3 g/dL (ref 3.5–5.2)
Alk Phos: 90 U/L (ref 40–130)
Anion Gap: 11 (ref 7–16)
Bilirubin,Total: 0.5 mg/dL (ref 0.0–1.2)
CO2: 26 mmol/L (ref 20–28)
Calcium: 9 mg/dL (ref 8.6–10.2)
Chloride: 102 mmol/L (ref 96–108)
Creatinine: 1.06 mg/dL (ref 0.67–1.17)
Glucose: 73 mg/dL (ref 60–99)
Lab: 18 mg/dL (ref 6–20)
Potassium: 4.2 mmol/L (ref 3.3–5.1)
Sodium: 139 mmol/L (ref 133–145)
Total Protein: 6.2 g/dL — ABNORMAL LOW (ref 6.3–7.7)
eGFR BY CREAT: 73

## 2024-09-06 LAB — IGM: IgM: 149 mg/dL (ref 40–230)

## 2024-09-06 LAB — IGA: IgA: 128 mg/dL (ref 70–400)

## 2024-09-06 LAB — IGG: IgG: 663 mg/dL — ABNORMAL LOW (ref 700–1600)

## 2024-09-06 MED ORDER — METHYLPREDNISOLONE SOD SUCC 125 MG IJ SOLR(62.5MG/ML) *WRAPPED*
125.0000 mg | Freq: Once | INTRAMUSCULAR | Status: AC
Start: 2024-09-06 — End: 2024-09-06
  Administered 2024-09-06: 125 mg via INTRAVENOUS

## 2024-09-06 MED ORDER — CETIRIZINE HCL 10 MG PO TABS *I*
10.0000 mg | ORAL_TABLET | Freq: Once | ORAL | Status: AC
Start: 2024-09-06 — End: 2024-09-06
  Administered 2024-09-06: 10 mg via ORAL

## 2024-09-06 MED ORDER — DULOXETINE HCL 30 MG PO CPEP *I*
30.0000 mg | DELAYED_RELEASE_CAPSULE | Freq: Every day | ORAL | 2 refills | Status: AC
Start: 2024-09-06 — End: 2024-12-05

## 2024-09-06 MED ORDER — OCRELIZUMAB (OCREVUS) 300 MG IN NS 285 ML VIAL-MATE SIMPLE *I*
600.0000 mg | Freq: Once | Status: AC
Start: 2024-09-06 — End: 2024-09-06
  Administered 2024-09-06: 600 mg via INTRAVENOUS
  Filled 2024-09-06: qty 600

## 2024-09-06 MED ORDER — DIPHENHYDRAMINE HCL 25 MG ORAL SOLID *WRAPPED*
25.0000 mg | Freq: Once | ORAL | Status: AC
Start: 2024-09-06 — End: 2024-09-06
  Administered 2024-09-06: 25 mg via ORAL

## 2024-09-06 MED ORDER — ACETAMINOPHEN 325 MG PO TABS *I*
975.0000 mg | ORAL_TABLET | Freq: Once | ORAL | Status: AC
Start: 2024-09-06 — End: 2024-09-06
  Administered 2024-09-06: 975 mg via ORAL

## 2024-09-06 NOTE — Patient Instructions (Signed)
 Continue with your usual medications as prescribed.    Ocrevus can increase your risk of respiratory, skin, and herpes infections, please call [phone] with any of the following:    Fevers/chills  Cough that will not go away  Signs and symptoms of respiratory infections  Cold sores/shingles/genital sores  Itching/rashes/hives     Side effects can occur up to a week after infusion.  If you experience body aches or low grade fever you can take Tylenol or Ibuprofen as prescribed. If you experience a rash or itching you can take Benadryl or any other allergy medicine you may have as prescribed. Please call the patient line at [phone] if symptoms don't go away.    You may have more chance of getting an infection. Wash hands often. Stay away from people with infections, colds, or flu. Continue to wear your mask out in public. It is highly recommended that you get a COVID-19 vaccine and booster. People who are fully vaccinated on this therapy have gotten COVID-19. If you think you may have COVID-19 get tested right away. If you are positive you may qualify for medications to lessen the severity of COVID-19. There is a limited time frame these medications can be administered so please get tested as soon as possible and let your neurologist know of a positive test result.    You should not receive any live vaccines while on Ocrevus.  Please check with your MD first. Be sure to have regular breast exams. Your doctor will tell you know how often to have these. You will also need to do breast self-exams as your doctor has told you.  Women should avoid pregnancy while taking this medication. Let your Doctor know right away if you become pregnant.    Schedule a follow-up with your provider as close to your infusion appointment as possible but within 4 -6 months after your infusion.  Contact Neurology Scheduling for assistance at [phone] and listen for the prompts.    If you have an insurance changes, please notify  the office at [phone] as benefits must be verified.     Please reach out to the clinic with any additional questions or concerns. We encourage you to sign up and use MyChart for any non-urgent medical questions as MyChart is the preferred method of communication.  Please contact your pharmacy for medication refills and allow up to 2 business days for routine prescription refills.  For urgent messages, please call the clinic at [phone].  The clinic is open from 8am to 4:30pm Monday through Friday.    You can get [facility] appointment reminders by text - please make sure we have your cell phone number on file at check in/check-out.  Then you can just text [facility] to [phone] to receive appointment messages.

## 2024-09-06 NOTE — Progress Notes (Addendum)
 Multiple Sclerosis Clinic Follow-up VisitSubjective:[name] is a 73 y.o. 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 Since his last visit Mar 01, 2024, he reports, No new symptoms.Bothersome ongoing symptoms - more unstable than usual, no falls, uses hiking poles as walking sticks, numbness, tingling and burning in distal feet.  [Further burning he used duloxetine  up to 60 mg however this was not helpful so he stopped using it]Fatigue Gait dysfunction-  Ampyra  which is helpful. He plays golf to keep activeLast Ocrevus  infusion Feb 21, 2024-tolerated well, denies any infections.Patient reported that he has baseline double vision, when he closes 1 eye it resolves, this has been there for more than the past few years, he has always had prism  in his glasses which is now resolved the double vision.He will be living in [place]  for half the year, he is looking to find a neurologist there as well.MS History:Clinical presentations:1990's R arm weaknessLater numbness hands and feet and stereotyped abnormal distal R thigh sensation2024/25 progressive gait dysfunctionMRIs:4/08 brain-relatively mild lesion burden, 1 enhancing lesion at the time5/13 brain-no new lesions11/14 brain-unchanged10/16 brain-unchanged4/18 brain-unchanged               Thoracic-few, non-enhancing intramedullary lesions12/20 brain-unchanged                Thoracic cord- at least 3 new intramedullary lesions compared with 20186/21 brain and thoracic cord-unchanged10/22: brain unchanged9/23 brain-stable. Cervical-intramedullary lesions C2, C3, C4, C5, C75/25 brain-stable. Thoracic-unchanged intramedullary lesions.Other testing:12/20 JC index 0.33, indeterminate to negative6/21 natalizumab  antibody +5/25 B cells 0. IgG and IgM wnl5/25 1.07 CrDisease modifying treatment hx:Betaseron  started in 1990's, stopped due to injection site  reactions/infectionsTecfidera started 5/13, stopped due to new thoracic cord lesions1/21 Tysabri  started, stopped due to +natalizumab  antibodyOcrevus started 7/21 and continued through the presentSymptomatic treatment hx:ED-Cialis  helpsFoot cramps-baclofen  can be helpfulStereotyped R thigh abnormal sensation-less bothersome, not certain if Tegretol  helpedUncomfortable numbness feet-Lyrica  not helpful and caused side effects. Gabapentin  caused him to feel loopy. Cymbalta  60mg  not helpful, has not trialed higher dosesKnee pain-acupunctureGait-Ampyra , gait worsened when it was stopped. PT less helpful in the past. 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 Lives with his wife. Has 3 children. Never smoker. [place] in [place]. Enjoys golf and swimming. Meds:Current Outpatient Medications on File Prior to Visit Medication Sig Dispense Refill  dalfampridine  (AMPYRA ) 10 MG tablet Take 1 tablet (10 mg total) by mouth 2 times daily. 180 tablet 1  omeprazole  (PRILOSEC) 20 mg capsule Take 1 capsule (20 mg total) by mouth daily (before breakfast). 90 capsule 1  DULoxetine  (CYMBALTA ) 60 mg DR capsule Take 1 capsule (60 mg total) by mouth daily. 90 capsule 3  tadalafil  (CIALIS ) 5 MG tablet Take 1 tablet (5 mg total) by mouth daily. 90 tablet 1  diazePAM  (VALIUM ) 5 mg tablet for Feeling Anxious. Take 1 tab PO 60 minutes before MRI.  May repeat x2 PRN anxiety. MDD: 3 tablets (Patient not taking: Reported on 03/01/2024) 3 tablet 0  atorvastatin  (LIPITOR) 40 mg tablet TAKE 1 TABLET NIGHTLY 90 tablet 3  Calcium  Citrate-Vitamin D  (CALCIUM  CITRATE +D PO) Take 1 capsule by mouth daily.    ocrelizumab  (OCREVUS ) 300 MG/10ML injection Administer into the vein every 6 months    ascorbic acid (VITAMIN C) 100 MG tablet Take 1 tablet (100 mg total) by mouth daily.    Cyanocobalamin  (VITAMIN B-12 CR PO) Take 1,000 mcg by mouth daily      No current facility-administered medications on file prior to visit.  VitalsPHYSICAL EXAM:MENTAL STATUS: awake and alert; Appropriately oriented. language intact to casual conversation. affect was appropriate to situationCRANIAL NERVES:  II: acuity was: OD20/40 /OS20/25 ; pupils reactive to light; able to count on all the fields  III/IV/VI: versions intact without nystagmus  V: facial sensation symmetric to light touch  VII: facial expression symmetric  VIII: hearing intact to voice  IX/X: palate elevates symmetrically  XI: shoulder shrug symmetric  XII: tongue midlineMOTOR:Upper Extremity Strength  (R/L) Shoulder abduction 5/5 Elbow flexion 5/5 Elbow extension 5/5 Wrist flexion 5/5 Wrist extension 5/5 Lower Extremity Strength  (R/L) Hip flexion 4/4 Knee extension 5/5 Knee flexion 5/5 Ankle plantarflexion 5/5 Ankle dorsiflexion 5/5 SENSATION:- light touch intact- vibration (R/L, seconds): 19/18 at the great toes- No sensory ataxia on finger to nose with eyes closed- Romberg was absentCOORDINATION:- finger to nose normal, no ataxia on examGAIT:- Able to walk unassisted however given that this was post Benadryl  for the infusion, he was unsteady, needed a caneQUANTITATIVE SCORES:Timed 25-foot walk (sec): 9 sec.  Assistive device: cane [post Benadryl  he was more unsteady than usual and needed a cane]Prior  6.05   Assistive device: noneLab reviewedMay 12, 2025-normal WBC, -ALC 1.1-CMP normal except AST ALT 63, 81 in the past it has been normalLymphocyte subsets expected B-cell depletion, CD4 cells normal, CD8 cells lowNormal immunoglobulin11/26/2025CBC normal, ALC 1.4CMP normal LFTsLymphocyte subsets expected B-cell depletion CD4 normalImmunoglobulins normal except IgG which is 663 borderline lowAssesment and Plan:73 y.o. M with progressive MS with activity with some difficulty with gait/leg strength, on Ocrevus , tolerating.  Recall from the past that usually in the 70s the risk of Ocrevus  outweighs the benefit but in his case given the recent cord lesions indicating more inflammatory than typical for his age the benefit of continuing Ocrevus  outweighs the risk.  In the future this can be reassessed and reconsidered.For his neuropathy pain he was willing to try the Cymbalta  again given that we had not reached the full dose yet, will titrated to 90mg .  If this is still not helpful we plan to try nortriptyline. Labs from today and prior visit reviewed as above.DMT- continue Ocrevus  q6 mo-He will discuss with his primary care physician regarding Prevnar 20 vaccineMonitoring- Labs: CBC, CMP, lymphocyte subsets, immunoglobulins q11mo with infusions- MRI to be ordered next visitSymptomatic treatment- Ongoing exercise and use of Ampyra  for symptomatic tx- Neuropathic pain Cymbalta  30 mg for a week, 60 mg for a week followed by 90 mg thereafter.(30 mg tablets sent to [place]  preferred pharmacy.]FUA 6 months.Discussed with Dr. Lucky Maywood Corrine Baker, MD Neuroimmunology Fellow Attending Addendum:I discussed and examined this patient with Dr Baker. With minor edits I agree with the history, findings, assessment and plan.Avianna Moynahan, MD

## 2024-09-06 NOTE — Progress Notes (Signed)
 Pt arrives for Ocrevus infusion #10 in usual state of health . Denies recent illness, infection or fever.  Pre-medicated as per MAR.   Ocrevus infused per rapid protocol. Pt tolerated infusion well, and was discharged home stable. AVS and discharge instructions given and reviewed.

## 2024-09-06 NOTE — Patient Instructions (Addendum)
 Please discuss with your Primary care about prevnar 20 vaccine Start taking Cymbalta  30 mg for a week once daily  followed by 60 mg for a week once daily  followed by 90 mg daily there after Please reach out to the clinic with any additional questions or concerns. We encourage you to sign up and use MyChart for any non-urgent medical questions as MyChart is the preferred method of communication.  Please contact your pharmacy for medication refills and allow up to 2 business days for routine prescription refills.  For urgent messages, please call the clinic at [phone].  The clinic is open from 8am to 4:30pm Monday through Friday.You can get [facility] appointment reminders by text - please make sure we have your cell phone number on file at check in/check-out.  Then you can just text [facility] to [phone] to receive appointment messages.

## 2024-09-11 ENCOUNTER — Encounter: Payer: Self-pay | Admitting: Neurology

## 2024-09-12 NOTE — Telephone Encounter (Signed)
Will defer pt's MyChart message to Dr. Robb for recommendations.  See MyChart messages for further details.

## 2024-09-14 ENCOUNTER — Telehealth: Payer: Self-pay | Admitting: Neurology

## 2024-09-14 NOTE — Telephone Encounter (Signed)
 Patient Telephone Call - Callback DocumentationCaller name: Charlott WellcareRelationship to patient: Insurance companyCallback number:  [phone] Information: Charlott called and stated wanting to know the diagnosis code for the PA for the medication dalfampridine  (AMPYRA ) 10 MG tablet Darrold will also like to know if the medication is being used to treat or improve mobility for pts with MS. Charlott stated our office can speak with anyone in regards to pt when calling them.Action taken: Unable to warm transfer, routing message

## 2024-09-15 NOTE — Telephone Encounter (Signed)
 Called and spoke to staff at [facility]. They are going to send a fax over with some additional questions that are needed for the prior auth for dalfampridine . They will fax this to my attention at [phone].

## 2024-09-23 ENCOUNTER — Other Ambulatory Visit: Payer: Self-pay | Admitting: Neurology

## 2024-09-23 DIAGNOSIS — G35D Multiple sclerosis, unspecified: Secondary | ICD-10-CM

## 2024-09-25 MED ORDER — DALFAMPRIDINE 10 MG PO TB12 *I*: 10.0000 mg | ORAL_TABLET | Freq: Two times a day (BID) | ORAL | 1 refills | Status: DC

## 2024-09-25 NOTE — Telephone Encounter (Signed)
 Per ISTOP, last dispensed:Date of last visit and plan for FUV:11/26/25Date of FUV? 5/26/2026Date of last MRI and plan for next:5/5/25Is MRI ordered?noDMT (including alternative dosing if applicable):Ocrevus  Labs:  Lab results: 11/26/250800 Sodium 139 Potassium 4.2 Chloride 102 CO2 26 UN 18 Creatinine 1.06 Glucose 73 Calcium  9.0   Lab results: 11/26/250800 Total Protein 6.2* Albumin 4.3 ALT 47 AST 48 Alk Phos 90 Bilirubin,Total 0.5   Lab results: 11/26/250800 WBC 6.9 Hemoglobin 13.4 Hematocrit 42 RBC 4.9 Platelets 219 Neut # K/uL 4.4 Lymph # K/uL 1.4 Mono # K/uL 0.8 Eos # K/uL 0.2 Baso # K/uL 0.0 Seg Neut % 64.1

## 2024-09-26 ENCOUNTER — Encounter: Payer: Self-pay | Admitting: Family Medicine

## 2024-09-28 ENCOUNTER — Encounter: Payer: Self-pay | Admitting: Neurology

## 2024-09-28 DIAGNOSIS — G35D Multiple sclerosis, unspecified: Secondary | ICD-10-CM

## 2024-09-28 NOTE — Telephone Encounter (Signed)
 The request has been submitted and is pending.

## 2024-09-28 NOTE — Telephone Encounter (Signed)
 Writer called pt's pharmacy to further inquire on on script for Ampyra  as this was sent on 09/25/24 and pt sent [facility] message in that they are awaiting approval. Writer inquired if [facility] needs PA for Ampyra ? Tech stated that PA is needed for this medication.Writer will send to PA team to begin working on this for the pt.Pharm tech provided information for the PA needed as we have not received the fax bzu:Rjoo plan at [phone] is [phone]

## 2024-10-03 NOTE — Telephone Encounter (Signed)
 SPoke with pt who reported he has another prescription coverage:PA team, please run PA request through this planWellCare Prescription Drug PlanMember ID: [id number] Plan #: [id number] # [phone]) [id number] Rx BIN [phone]

## 2024-10-10 MED ORDER — DALFAMPRIDINE 10 MG PO TB12 *I*: 10.0000 mg | ORAL_TABLET | Freq: Two times a day (BID) | ORAL | 5 refills | Status: DC

## 2024-10-15 ENCOUNTER — Encounter: Payer: Self-pay | Admitting: Neurology

## 2024-10-15 DIAGNOSIS — G35D Multiple sclerosis, unspecified: Secondary | ICD-10-CM

## 2024-10-16 NOTE — Telephone Encounter (Signed)
Will defer pt's MyChart message to Dr. Robb for recommendations.  See MyChart messages for further details.

## 2024-10-18 NOTE — Telephone Encounter (Signed)
 I only see the Ampyra  denial from [facility] scanned into the chart. Per the pt, this is the incorrect insurance to use since it does not have medication coverage. Prior auth team, could a prior auth be attempted using his primary Medicare drug coverage instead?

## 2024-10-19 NOTE — Telephone Encounter (Signed)
 Patient started the [facility] PA request for dalfampridine  which resulted in a denial on 09/16/24 due to lack of clinical documentation.Appeal submitted today with clinical documentation and request is pending.

## 2024-10-20 NOTE — Telephone Encounter (Signed)
 1/8/202611:59 AM ONCBCN  RX PRIOR AUTH  Prior Authorization Needed Yes  Treatment/Medication Name dalfampridine  (AMPYRA ) 10 MG tablet,#60/30 (APPEAL)  Date of Service  Authorization Type Medication  CPT Code  Diagnosis Code G35  Physician Name and NPI Zoghlin, Tinnie Garre, MD   NPI: [id number]  Service Location [facility]  Prior Authorization Status Authorization approved  Request Date 10/19/2024  Authorization Number [id number]  Authorization Start Date 09/16/2024  Authorization End Date 10/11/2038  Insurance Company Name [facility]  Policy Number [id number]  Comments PA APPROVED UNTIL FURTHER NOTICE.

## 2024-10-25 MED ORDER — DALFAMPRIDINE 10 MG PO TB12 *I*: 10.0000 mg | ORAL_TABLET | Freq: Two times a day (BID) | ORAL | 5 refills | Status: DC

## 2024-10-25 NOTE — Addendum Note (Signed)
 Addended by: DELORA ETHA HERO on: 10/25/2024 03:34 PM Modules accepted: Orders

## 2024-11-02 ENCOUNTER — Other Ambulatory Visit: Payer: Self-pay | Admitting: Family Medicine

## 2024-11-03 MED ORDER — ATORVASTATIN CALCIUM 40 MG PO TABS *I*: 40.0000 mg | ORAL_TABLET | Freq: Every evening | ORAL | 3 refills | Status: AC

## 2024-11-08 ENCOUNTER — Encounter: Payer: Self-pay | Admitting: Neurology

## 2024-11-08 ENCOUNTER — Other Ambulatory Visit: Payer: Self-pay | Admitting: Family Medicine

## 2024-11-08 DIAGNOSIS — G35D Multiple sclerosis, unspecified: Secondary | ICD-10-CM

## 2024-11-08 MED ORDER — TADALAFIL 5 MG PO TABS *I*: 5.0000 mg | ORAL_TABLET | Freq: Every day | ORAL | 3 refills | Status: AC

## 2024-11-08 MED ORDER — DALFAMPRIDINE 10 MG PO TB12 *I*: 10.0000 mg | ORAL_TABLET | Freq: Two times a day (BID) | ORAL | 1 refills | Status: AC

## 2024-11-08 NOTE — Telephone Encounter (Signed)
 Recently sent script to [facility] and pt is requesting change of pharmacy to [facility].Date of last visit and plan for FUV:  11/26/25Date of FUV?  5/26/26Date of last MRI and plan for next:  02/14/24 MRI Head and ThoracicNoIs MRI ordered?  NoDMT (include alternate dosing if applicable):  OcrevusLabs:  Lab results: 11/26/250800 Sodium 139 Potassium 4.2 Chloride 102 CO2 26 UN 18 Creatinine 1.06 Glucose 73 Calcium  9.0     Lab results: 11/26/250800 Total Protein 6.2* Albumin 4.3 ALT 47 AST 48 Alk Phos 90 Bilirubin,Total 0.5   Lab results: 11/26/250800 WBC 6.9 Hemoglobin 13.4 Hematocrit 42 RBC 4.9 Platelets 219 Neut # K/uL 4.4 Lymph # K/uL 1.4 Mono # K/uL 0.8 Eos # K/uL 0.2 Baso # K/uL 0.0 Seg Neut % 64.1

## 2024-11-08 NOTE — Telephone Encounter (Signed)
 Last office visit: Last Office Visit   Date Provider Department Visit Type Primary Dx  02/22/2024 Gerald [name], MD [name] Office Visit Preventative health care   Last PA Office Visit  None  Patients upcoming appointments:Future Appointments Date Time Provider Department Center 02/27/2025  9:20 AM Gerald [name], MD [facility] None 03/06/2025  8:00 AM [facility] None 03/06/2025  9:00 AM Royetta Rouse, PA [facility] None Recent Lab results:GENERAL CHEMISTRY Recent Labs   11/26/250800 05/12/250824 NA 139 139 K 4.2 4.0 CL 102 103 CO2 26 24 GAP 11 12 UN 18 18 CREAT 1.06 1.07 GLU 73 69 CA 9.0 9.1  LIPID PROFILE Recent Labs   05/12/250824 CHOL 130 TRIG 52 HDL 61* LDLC 57  LIVER PROFILE Recent Labs   11/26/250800 05/12/250824 ALT 47 81* AST 48 63* ALK 90 87 TB 0.5 0.9  DIABETES THYROID  Recent Labs   05/12/250824 HA1C 5.3  No value within the past 365 days  Pending/Orders Labs:Lab Frequency Next Occurrence  Opioid Drug Screen:No results for input(s): AMPU, BEU, OPSU, OXYU, THCU, BZDU, COPS, CTHC, UCRNC, TRAMD in the last 8760 hours.Last dispensed if controlled:

## 2024-11-16 ENCOUNTER — Telehealth: Payer: Self-pay | Admitting: Family Medicine

## 2024-11-16 NOTE — Telephone Encounter (Signed)
 Prior auth started for:tadalafil  (CIALIS ) 5 MG tablet Key: [id number]

## 2025-02-27 ENCOUNTER — Encounter: Payer: Medicare (Managed Care) | Admitting: Family Medicine

## 2025-03-06 ENCOUNTER — Ambulatory Visit
